# Patient Record
Sex: Female | Born: 1940 | Race: White | Hispanic: No | Marital: Married | Smoking: Never smoker
Health system: Southern US, Community
[De-identification: ages and names within clinical notes are randomized; demographics above are authoritative.]

## PROBLEM LIST (undated history)

## (undated) DIAGNOSIS — R519 Headache, unspecified: Secondary | ICD-10-CM

## (undated) DIAGNOSIS — E039 Hypothyroidism, unspecified: Secondary | ICD-10-CM

## (undated) DIAGNOSIS — E785 Hyperlipidemia, unspecified: Secondary | ICD-10-CM

## (undated) DIAGNOSIS — K649 Unspecified hemorrhoids: Secondary | ICD-10-CM

## (undated) DIAGNOSIS — I10 Essential (primary) hypertension: Secondary | ICD-10-CM

## (undated) DIAGNOSIS — M1990 Unspecified osteoarthritis, unspecified site: Secondary | ICD-10-CM

## (undated) DIAGNOSIS — K219 Gastro-esophageal reflux disease without esophagitis: Secondary | ICD-10-CM

## (undated) DIAGNOSIS — I2510 Atherosclerotic heart disease of native coronary artery without angina pectoris: Secondary | ICD-10-CM

## (undated) DIAGNOSIS — R569 Unspecified convulsions: Secondary | ICD-10-CM

## (undated) DIAGNOSIS — R51 Headache: Secondary | ICD-10-CM

## (undated) HISTORY — PX: GANGLION CYST EXCISION: SHX1691

## (undated) HISTORY — PX: JOINT REPLACEMENT: SHX530

## (undated) HISTORY — PX: BUNIONECTOMY: SHX129

## (undated) HISTORY — DX: Unspecified convulsions: R56.9

## (undated) HISTORY — PX: TRIGGER FINGER RELEASE: SHX641

## (undated) HISTORY — PX: EYE SURGERY: SHX253

## (undated) HISTORY — PX: CARDIAC CATHETERIZATION: SHX172

## (undated) HISTORY — DX: Atherosclerotic heart disease of native coronary artery without angina pectoris: I25.10

---

## 1988-05-31 HISTORY — PX: ABDOMINAL HYSTERECTOMY: SHX81

## 2012-02-11 ENCOUNTER — Other Ambulatory Visit: Payer: Self-pay | Admitting: Neurosurgery

## 2012-03-20 ENCOUNTER — Encounter (HOSPITAL_COMMUNITY): Payer: Self-pay | Admitting: Pharmacy Technician

## 2012-03-24 ENCOUNTER — Encounter (HOSPITAL_COMMUNITY)
Admission: RE | Admit: 2012-03-24 | Discharge: 2012-03-24 | Disposition: A | Discharge status: Home / Self Care | Payer: Medicare Other | Source: Ambulatory Visit | Attending: Neurosurgery | Admitting: Neurosurgery

## 2012-03-24 ENCOUNTER — Ambulatory Visit (HOSPITAL_COMMUNITY)
Admission: RE | Admit: 2012-03-24 | Discharge: 2012-03-24 | Disposition: A | Discharge status: Home / Self Care | Payer: Medicare Other | Source: Ambulatory Visit | Attending: Neurosurgery | Admitting: Neurosurgery

## 2012-03-24 ENCOUNTER — Encounter (HOSPITAL_COMMUNITY): Payer: Self-pay

## 2012-03-24 DIAGNOSIS — M5380 Other specified dorsopathies, site unspecified: Secondary | ICD-10-CM | POA: Insufficient documentation

## 2012-03-24 DIAGNOSIS — Z01818 Encounter for other preprocedural examination: Secondary | ICD-10-CM | POA: Insufficient documentation

## 2012-03-24 HISTORY — DX: Unspecified hemorrhoids: K64.9

## 2012-03-24 HISTORY — DX: Hyperlipidemia, unspecified: E78.5

## 2012-03-24 HISTORY — DX: Essential (primary) hypertension: I10

## 2012-03-24 HISTORY — DX: Gastro-esophageal reflux disease without esophagitis: K21.9

## 2012-03-24 HISTORY — DX: Hypothyroidism, unspecified: E03.9

## 2012-03-24 HISTORY — DX: Unspecified osteoarthritis, unspecified site: M19.90

## 2012-03-24 LAB — CBC
HCT: 40.7 % (ref 36.0–46.0)
Hemoglobin: 13.6 g/dL (ref 12.0–15.0)
MCH: 32.1 pg (ref 26.0–34.0)
MCHC: 33.4 g/dL (ref 30.0–36.0)
MCV: 96 fL (ref 78.0–100.0)
Platelets: 226 10*3/uL (ref 150–400)
RBC: 4.24 MIL/uL (ref 3.87–5.11)
RDW: 12.5 % (ref 11.5–15.5)
WBC: 6.8 10*3/uL (ref 4.0–10.5)

## 2012-03-24 LAB — BASIC METABOLIC PANEL
BUN: 20 mg/dL (ref 6–23)
CO2: 32 mEq/L (ref 19–32)
Calcium: 9.8 mg/dL (ref 8.4–10.5)
Chloride: 101 mEq/L (ref 96–112)
Creatinine, Ser: 0.95 mg/dL (ref 0.50–1.10)
GFR calc Af Amer: 68 mL/min — ABNORMAL LOW (ref >90)
GFR calc non Af Amer: 59 mL/min — ABNORMAL LOW (ref >90)
Glucose, Bld: 95 mg/dL (ref 70–99)
Potassium: 3.8 mEq/L (ref 3.5–5.1)
Sodium: 141 mEq/L (ref 135–145)

## 2012-03-24 LAB — SURGICAL PCR SCREEN
MRSA, PCR: NEGATIVE
Staphylococcus aureus: NEGATIVE

## 2012-03-24 NOTE — Pre-Procedure Instructions (Signed)
20 Zakara Beaird  03/24/2012   Your procedure is scheduled on:  Monday, November 4th.  Report to [facility] at 5:30 AM.  Call this number if you have problems the morning of surgery: [phone]   Remember:   Do not eat food or drink any liquid:After Midnight.     Take these medicines the morning of surgery with A SIP OF WATER: Levothyroxine (Synthyroid).  Stop taking any Aspirin or NSAIDs or Herbal Medications (Fish Oil, Coenzyme Q10).  Do not wear jewelry, make-up or nail polish.  Do not wear lotions, powders, or perfumes. You may wear deodorant.  Do not shave 48 hours prior to surgery. Men may shave face and neck.  Do not bring valuables to the hospital.  Contacts, dentures or bridgework may not be worn into surgery.  Leave suitcase in the car. After surgery it may be brought to your room.  For patients admitted to the hospital, checkout time is 11:00 AM the day of discharge.   Patients discharged the day of surgery will not be allowed to drive home.  Name and phone number of your driver: NA  Special Instructions: Shower using CHG 2 nights before surgery and the night before surgery.  If you shower the day of surgery use CHG.  Use special wash - you have one bottle of CHG for all showers.  You should use approximately 1/3 of the bottle for each shower.   Please read over the following fact sheets that you were given: Pain Booklet, Coughing and Deep Breathing and Surgical Site Infection Prevention

## 2012-03-27 ENCOUNTER — Other Ambulatory Visit (HOSPITAL_COMMUNITY): Payer: Self-pay

## 2012-04-02 MED ORDER — CEFAZOLIN SODIUM-DEXTROSE 2-3 GM-% IV SOLR
2.0000 g | INTRAVENOUS | Status: AC
  Administered 2012-04-03: 2 g via INTRAVENOUS
  Filled 2012-04-02: qty 50

## 2012-04-03 ENCOUNTER — Inpatient Hospital Stay (HOSPITAL_COMMUNITY): Payer: Medicare Other

## 2012-04-03 ENCOUNTER — Encounter (HOSPITAL_COMMUNITY): Payer: Self-pay | Admitting: *Deleted

## 2012-04-03 ENCOUNTER — Inpatient Hospital Stay (HOSPITAL_COMMUNITY)
Admission: RE | Admit: 2012-04-03 | Discharge: 2012-04-04 | DRG: 473 | Disposition: A | Discharge status: Home / Self Care | Payer: Medicare Other | Source: Ambulatory Visit | Attending: Neurosurgery | Admitting: Neurosurgery

## 2012-04-03 ENCOUNTER — Inpatient Hospital Stay (HOSPITAL_COMMUNITY): Payer: Medicare Other | Admitting: Anesthesiology

## 2012-04-03 ENCOUNTER — Encounter (HOSPITAL_COMMUNITY): Payer: Self-pay | Admitting: Anesthesiology

## 2012-04-03 ENCOUNTER — Encounter (HOSPITAL_COMMUNITY)
Admission: RE | Disposition: A | Discharge status: Home / Self Care | Payer: Self-pay | Source: Ambulatory Visit | Attending: Neurosurgery

## 2012-04-03 DIAGNOSIS — M5020 Other cervical disc displacement, unspecified cervical region: Secondary | ICD-10-CM | POA: Diagnosis present

## 2012-04-03 DIAGNOSIS — Z96698 Presence of other orthopedic joint implants: Secondary | ICD-10-CM

## 2012-04-03 DIAGNOSIS — M5030 Other cervical disc degeneration, unspecified cervical region: Principal | ICD-10-CM | POA: Diagnosis present

## 2012-04-03 DIAGNOSIS — M47812 Spondylosis without myelopathy or radiculopathy, cervical region: Secondary | ICD-10-CM | POA: Diagnosis present

## 2012-04-03 DIAGNOSIS — E785 Hyperlipidemia, unspecified: Secondary | ICD-10-CM | POA: Diagnosis present

## 2012-04-03 DIAGNOSIS — I10 Essential (primary) hypertension: Secondary | ICD-10-CM | POA: Diagnosis present

## 2012-04-03 DIAGNOSIS — Z79899 Other long term (current) drug therapy: Secondary | ICD-10-CM

## 2012-04-03 DIAGNOSIS — E039 Hypothyroidism, unspecified: Secondary | ICD-10-CM | POA: Diagnosis present

## 2012-04-03 DIAGNOSIS — K219 Gastro-esophageal reflux disease without esophagitis: Secondary | ICD-10-CM | POA: Diagnosis present

## 2012-04-03 HISTORY — PX: ANTERIOR CERVICAL DECOMP/DISCECTOMY FUSION: SHX1161

## 2012-04-03 SURGERY — ANTERIOR CERVICAL DECOMPRESSION/DISCECTOMY FUSION 1 LEVEL
Anesthesia: General | Wound class: Clean

## 2012-04-03 MED ORDER — ROCURONIUM BROMIDE 100 MG/10ML IV SOLN
INTRAVENOUS | Status: DC | PRN
  Administered 2012-04-03: 45 mg via INTRAVENOUS

## 2012-04-03 MED ORDER — ONDANSETRON HCL 4 MG/2ML IJ SOLN
4.0000 mg | Freq: Once | INTRAMUSCULAR | Status: AC | PRN
  Administered 2012-04-03: 4 mg via INTRAVENOUS

## 2012-04-03 MED ORDER — MENTHOL 3 MG MT LOZG: 1.0000 | LOZENGE | OROMUCOSAL | Status: DC | PRN

## 2012-04-03 MED ORDER — SODIUM CHLORIDE 0.9 % IV SOLN: 250.0000 mL | INTRAVENOUS | Status: DC

## 2012-04-03 MED ORDER — ALUM & MAG HYDROXIDE-SIMETH 200-200-20 MG/5ML PO SUSP: 30.0000 mL | Freq: Four times a day (QID) | ORAL | Status: DC | PRN

## 2012-04-03 MED ORDER — HYDROMORPHONE HCL PF 1 MG/ML IJ SOLN
INTRAMUSCULAR | Status: AC
  Filled 2012-04-03: qty 1

## 2012-04-03 MED ORDER — HYDROCHLOROTHIAZIDE 12.5 MG PO CAPS
12.5000 mg | ORAL_CAPSULE | Freq: Every day | ORAL | Status: DC
  Filled 2012-04-03: qty 1

## 2012-04-03 MED ORDER — BISACODYL 10 MG RE SUPP: 10.0000 mg | Freq: Every day | RECTAL | Status: DC | PRN

## 2012-04-03 MED ORDER — LIDOCAINE HCL (CARDIAC) 20 MG/ML IV SOLN
INTRAVENOUS | Status: DC | PRN
  Administered 2012-04-03: 40 mg via INTRAVENOUS

## 2012-04-03 MED ORDER — MAGNESIUM HYDROXIDE 400 MG/5ML PO SUSP: 30.0000 mL | Freq: Every day | ORAL | Status: DC | PRN

## 2012-04-03 MED ORDER — FENTANYL CITRATE 0.05 MG/ML IJ SOLN
INTRAMUSCULAR | Status: DC | PRN
  Administered 2012-04-03 (×2): 50 ug via INTRAVENOUS
  Administered 2012-04-03: 100 ug via INTRAVENOUS

## 2012-04-03 MED ORDER — ONDANSETRON HCL 4 MG/2ML IJ SOLN
INTRAMUSCULAR | Status: AC
  Filled 2012-04-03: qty 2

## 2012-04-03 MED ORDER — THROMBIN 5000 UNITS EX SOLR
CUTANEOUS | Status: DC | PRN
  Administered 2012-04-03 (×2): 5000 [IU] via TOPICAL

## 2012-04-03 MED ORDER — ACETAMINOPHEN 650 MG RE SUPP: 650.0000 mg | RECTAL | Status: DC | PRN

## 2012-04-03 MED ORDER — HYDROXYZINE HCL 50 MG PO TABS
50.0000 mg | ORAL_TABLET | ORAL | Status: DC | PRN
  Filled 2012-04-03: qty 1

## 2012-04-03 MED ORDER — GELATIN ABSORBABLE MT POWD
OROMUCOSAL | Status: DC | PRN
  Administered 2012-04-03: 09:00:00 via TOPICAL

## 2012-04-03 MED ORDER — PANTOPRAZOLE SODIUM 40 MG PO TBEC: 40.0000 mg | DELAYED_RELEASE_TABLET | Freq: Every day | ORAL | Status: DC

## 2012-04-03 MED ORDER — BUPIVACAINE HCL (PF) 0.5 % IJ SOLN
INTRAMUSCULAR | Status: DC | PRN
  Administered 2012-04-03: 5 mL

## 2012-04-03 MED ORDER — KCL IN DEXTROSE-NACL 20-5-0.45 MEQ/L-%-% IV SOLN
INTRAVENOUS | Status: DC
  Filled 2012-04-03 (×4): qty 1000

## 2012-04-03 MED ORDER — KETOROLAC TROMETHAMINE 30 MG/ML IJ SOLN
INTRAMUSCULAR | Status: AC
  Filled 2012-04-03: qty 1

## 2012-04-03 MED ORDER — HYDROCODONE-ACETAMINOPHEN 5-325 MG PO TABS: 1.0000 | ORAL_TABLET | ORAL | Status: DC | PRN

## 2012-04-03 MED ORDER — DIAZEPAM 5 MG/ML IJ SOLN
INTRAMUSCULAR | Status: AC
  Administered 2012-04-03: 2.5 mg
  Filled 2012-04-03: qty 2

## 2012-04-03 MED ORDER — SIMVASTATIN 20 MG PO TABS
20.0000 mg | ORAL_TABLET | Freq: Every evening | ORAL | Status: DC
  Administered 2012-04-03: 20 mg via ORAL
  Filled 2012-04-03 (×2): qty 1

## 2012-04-03 MED ORDER — ACETAMINOPHEN 10 MG/ML IV SOLN
1000.0000 mg | Freq: Once | INTRAVENOUS | Status: AC | PRN
  Administered 2012-04-03: 1000 mg via INTRAVENOUS

## 2012-04-03 MED ORDER — MIDAZOLAM HCL 5 MG/5ML IJ SOLN
INTRAMUSCULAR | Status: DC | PRN
  Administered 2012-04-03: 1 mg via INTRAVENOUS

## 2012-04-03 MED ORDER — MORPHINE SULFATE 4 MG/ML IJ SOLN: 4.0000 mg | INTRAMUSCULAR | Status: DC | PRN

## 2012-04-03 MED ORDER — LIDOCAINE-EPINEPHRINE 1 %-1:100000 IJ SOLN
INTRAMUSCULAR | Status: DC | PRN
  Administered 2012-04-03: 5 mL via INTRADERMAL

## 2012-04-03 MED ORDER — ONDANSETRON HCL 4 MG/2ML IJ SOLN
INTRAMUSCULAR | Status: DC | PRN
  Administered 2012-04-03: 4 mg via INTRAVENOUS

## 2012-04-03 MED ORDER — HEMOSTATIC AGENTS (NO CHARGE) OPTIME
TOPICAL | Status: DC | PRN
  Administered 2012-04-03: 1 via TOPICAL

## 2012-04-03 MED ORDER — ACETAMINOPHEN 10 MG/ML IV SOLN
1000.0000 mg | Freq: Four times a day (QID) | INTRAVENOUS | Status: DC
  Administered 2012-04-03 – 2012-04-04 (×3): 1000 mg via INTRAVENOUS
  Filled 2012-04-03 (×4): qty 100

## 2012-04-03 MED ORDER — NEOSTIGMINE METHYLSULFATE 1 MG/ML IJ SOLN
INTRAMUSCULAR | Status: DC | PRN
  Administered 2012-04-03: 3 mg via INTRAVENOUS

## 2012-04-03 MED ORDER — SODIUM CHLORIDE 0.9 % IJ SOLN
3.0000 mL | Freq: Two times a day (BID) | INTRAMUSCULAR | Status: DC
  Administered 2012-04-03: 3 mL via INTRAVENOUS

## 2012-04-03 MED ORDER — SODIUM CHLORIDE 0.9 % IR SOLN
Status: DC | PRN
  Administered 2012-04-03: 08:00:00

## 2012-04-03 MED ORDER — HYDROXYZINE HCL 50 MG/ML IM SOLN: 50.0000 mg | INTRAMUSCULAR | Status: DC | PRN

## 2012-04-03 MED ORDER — HYDROMORPHONE HCL PF 1 MG/ML IJ SOLN
0.2500 mg | INTRAMUSCULAR | Status: DC | PRN
  Administered 2012-04-03: 0.5 mg via INTRAVENOUS

## 2012-04-03 MED ORDER — ACETAMINOPHEN 325 MG PO TABS: 650.0000 mg | ORAL_TABLET | ORAL | Status: DC | PRN

## 2012-04-03 MED ORDER — PHENOL 1.4 % MT LIQD: 1.0000 | OROMUCOSAL | Status: DC | PRN

## 2012-04-03 MED ORDER — SODIUM CHLORIDE 0.9 % IJ SOLN: 3.0000 mL | INTRAMUSCULAR | Status: DC | PRN

## 2012-04-03 MED ORDER — GLYCOPYRROLATE 0.2 MG/ML IJ SOLN
INTRAMUSCULAR | Status: DC | PRN
  Administered 2012-04-03: 0.4 mg via INTRAVENOUS

## 2012-04-03 MED ORDER — SODIUM CHLORIDE 0.9 % IV SOLN
INTRAVENOUS | Status: AC
  Filled 2012-04-03: qty 500

## 2012-04-03 MED ORDER — PROPOFOL 10 MG/ML IV BOLUS
INTRAVENOUS | Status: DC | PRN
  Administered 2012-04-03: 170 mg via INTRAVENOUS

## 2012-04-03 MED ORDER — KETOROLAC TROMETHAMINE 30 MG/ML IJ SOLN
30.0000 mg | Freq: Four times a day (QID) | INTRAMUSCULAR | Status: DC
  Administered 2012-04-03 – 2012-04-04 (×3): 30 mg via INTRAVENOUS
  Filled 2012-04-03 (×7): qty 1

## 2012-04-03 MED ORDER — KETOROLAC TROMETHAMINE 30 MG/ML IJ SOLN
30.0000 mg | Freq: Once | INTRAMUSCULAR | Status: AC
  Administered 2012-04-03: 30 mg via INTRAVENOUS

## 2012-04-03 MED ORDER — ZOLPIDEM TARTRATE 5 MG PO TABS: 5.0000 mg | ORAL_TABLET | Freq: Every evening | ORAL | Status: DC | PRN

## 2012-04-03 MED ORDER — ACETAMINOPHEN 10 MG/ML IV SOLN
INTRAVENOUS | Status: AC
  Filled 2012-04-03: qty 100

## 2012-04-03 MED ORDER — CYCLOBENZAPRINE HCL 10 MG PO TABS
10.0000 mg | ORAL_TABLET | Freq: Three times a day (TID) | ORAL | Status: DC | PRN
  Administered 2012-04-03: 10 mg via ORAL
  Filled 2012-04-03: qty 1

## 2012-04-03 MED ORDER — ARTIFICIAL TEARS OP OINT
TOPICAL_OINTMENT | OPHTHALMIC | Status: DC | PRN
  Administered 2012-04-03: 1 via OPHTHALMIC

## 2012-04-03 MED ORDER — EPHEDRINE SULFATE 50 MG/ML IJ SOLN
INTRAMUSCULAR | Status: DC | PRN
  Administered 2012-04-03 (×6): 5 mg via INTRAVENOUS

## 2012-04-03 MED ORDER — LEVOTHYROXINE SODIUM 25 MCG PO TABS
25.0000 ug | ORAL_TABLET | Freq: Every day | ORAL | Status: DC
  Filled 2012-04-03: qty 1

## 2012-04-03 MED ORDER — OXYCODONE HCL 5 MG PO TABS: 5.0000 mg | ORAL_TABLET | ORAL | Status: DC | PRN

## 2012-04-03 MED ORDER — LOSARTAN POTASSIUM-HCTZ 50-12.5 MG PO TABS: 1.0000 | ORAL_TABLET | Freq: Every day | ORAL | Status: DC

## 2012-04-03 MED ORDER — 0.9 % SODIUM CHLORIDE (POUR BTL) OPTIME
TOPICAL | Status: DC | PRN
  Administered 2012-04-03: 1000 mL

## 2012-04-03 MED ORDER — LOSARTAN POTASSIUM 50 MG PO TABS
50.0000 mg | ORAL_TABLET | Freq: Every day | ORAL | Status: DC
  Filled 2012-04-03: qty 1

## 2012-04-03 MED ORDER — BACITRACIN 50000 UNITS IM SOLR
INTRAMUSCULAR | Status: AC
  Filled 2012-04-03: qty 1

## 2012-04-03 MED ORDER — OXYCODONE-ACETAMINOPHEN 5-325 MG PO TABS: 1.0000 | ORAL_TABLET | ORAL | Status: DC | PRN

## 2012-04-03 MED ORDER — LACTATED RINGERS IV SOLN
INTRAVENOUS | Status: DC | PRN
  Administered 2012-04-03 (×2): via INTRAVENOUS

## 2012-04-03 SURGICAL SUPPLY — 47 items
ALLOGRAFT 7X14X11 (Bone Implant) ×2 IMPLANT
BAG DECANTER FOR FLEXI CONT (MISCELLANEOUS) ×2 IMPLANT
BIT DRILL NEURO 2X3.1 SFT TUCH (MISCELLANEOUS) ×1 IMPLANT
BLADE ULTRA TIP 2M (BLADE) ×2 IMPLANT
BRUSH SCRUB EZ PLAIN DRY (MISCELLANEOUS) ×2 IMPLANT
CANISTER SUCTION 2500CC (MISCELLANEOUS) ×2 IMPLANT
CLOTH BEACON ORANGE TIMEOUT ST (SAFETY) ×2 IMPLANT
CONT SPEC 4OZ CLIKSEAL STRL BL (MISCELLANEOUS) ×2 IMPLANT
COVER MAYO STAND STRL (DRAPES) ×2 IMPLANT
DECANTER SPIKE VIAL GLASS SM (MISCELLANEOUS) ×2 IMPLANT
DERMABOND ADVANCED (GAUZE/BANDAGES/DRESSINGS) ×1
DERMABOND ADVANCED .7 DNX12 (GAUZE/BANDAGES/DRESSINGS) ×1 IMPLANT
DRAPE LAPAROTOMY 100X72 PEDS (DRAPES) ×2 IMPLANT
DRAPE MICROSCOPE LEICA (MISCELLANEOUS) ×2 IMPLANT
DRAPE POUCH INSTRU U-SHP 10X18 (DRAPES) ×2 IMPLANT
DRAPE PROXIMA HALF (DRAPES) IMPLANT
DRILL NEURO 2X3.1 SOFT TOUCH (MISCELLANEOUS) ×2
ELECT COATED BLADE 2.86 ST (ELECTRODE) ×4 IMPLANT
ELECT REM PT RETURN 9FT ADLT (ELECTROSURGICAL) ×2
ELECTRODE REM PT RTRN 9FT ADLT (ELECTROSURGICAL) ×1 IMPLANT
GLOVE BIOGEL PI IND STRL 8 (GLOVE) ×2 IMPLANT
GLOVE BIOGEL PI INDICATOR 8 (GLOVE) ×2
GLOVE ECLIPSE 7.5 STRL STRAW (GLOVE) ×8 IMPLANT
GLOVE EXAM NITRILE LRG STRL (GLOVE) IMPLANT
GLOVE EXAM NITRILE MD LF STRL (GLOVE) ×2 IMPLANT
GLOVE EXAM NITRILE XL STR (GLOVE) IMPLANT
GLOVE EXAM NITRILE XS STR PU (GLOVE) IMPLANT
GOWN BRE IMP SLV AUR LG STRL (GOWN DISPOSABLE) IMPLANT
GOWN BRE IMP SLV AUR XL STRL (GOWN DISPOSABLE) ×4 IMPLANT
GOWN STRL REIN 2XL LVL4 (GOWN DISPOSABLE) ×2 IMPLANT
HEAD HALTER (SOFTGOODS) ×2 IMPLANT
KIT BASIN OR (CUSTOM PROCEDURE TRAY) ×2 IMPLANT
KIT ROOM TURNOVER OR (KITS) ×2 IMPLANT
NEEDLE HYPO 25X1 1.5 SAFETY (NEEDLE) ×2 IMPLANT
NEEDLE SPNL 22GX3.5 QUINCKE BK (NEEDLE) ×2 IMPLANT
NS IRRIG 1000ML POUR BTL (IV SOLUTION) ×2 IMPLANT
PACK LAMINECTOMY NEURO (CUSTOM PROCEDURE TRAY) ×2 IMPLANT
PAD ARMBOARD 7.5X6 YLW CONV (MISCELLANEOUS) ×6 IMPLANT
RUBBERBAND STERILE (MISCELLANEOUS) ×4 IMPLANT
SPONGE INTESTINAL PEANUT (DISPOSABLE) ×2 IMPLANT
SPONGE SURGIFOAM ABS GEL SZ50 (HEMOSTASIS) ×2 IMPLANT
SUT VIC AB 2-0 CP2 18 (SUTURE) ×2 IMPLANT
SUT VIC AB 3-0 SH 8-18 (SUTURE) ×2 IMPLANT
SYR 20ML ECCENTRIC (SYRINGE) ×2 IMPLANT
TOWEL OR 17X24 6PK STRL BLUE (TOWEL DISPOSABLE) ×2 IMPLANT
TOWEL OR 17X26 10 PK STRL BLUE (TOWEL DISPOSABLE) ×2 IMPLANT
WATER STERILE IRR 1000ML POUR (IV SOLUTION) ×2 IMPLANT

## 2012-04-03 NOTE — Anesthesia Preprocedure Evaluation (Addendum)
Anesthesia Evaluation  Patient identified by MRN, date of birth, ID band Patient awake    Reviewed: Allergy & Precautions, H&P , NPO status , Patient's Chart, lab work & pertinent test results  Airway Mallampati: II TM Distance: >3 FB     Dental  (+) Teeth Intact and Dental Advisory Given   Pulmonary  breath sounds clear to auscultation        Cardiovascular hypertension, Pt. on medications Rhythm:Regular Rate:Normal     Neuro/Psych    GI/Hepatic GERD-  Medicated,  Endo/Other  Hypothyroidism   Renal/GU      Musculoskeletal   Abdominal   Peds  Hematology   Anesthesia Other Findings   Reproductive/Obstetrics                          Anesthesia Physical Anesthesia Plan  ASA: III  Anesthesia Plan: General   Post-op Pain Management:    Induction: Intravenous  Airway Management Planned: Oral ETT  Additional Equipment:   Intra-op Plan:   Post-operative Plan: Extubation in OR  Informed Consent: I have reviewed the patients History and Physical, chart, labs and discussed the procedure including the risks, benefits and alternatives for the proposed anesthesia with the patient or authorized representative who has indicated his/her understanding and acceptance.   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

## 2012-04-03 NOTE — Progress Notes (Signed)
Filed Vitals:   04/03/12 1015 04/03/12 1030 04/03/12 1100 04/03/12 1211  BP: 122/48 118/48 137/68 122/63  Pulse:   51 63  Temp:  97.6 F (36.4 C)    TempSrc:      Resp:   16 16  Height:      Weight:      SpO2:   98% 98%    Patient with moderate upper back discomfort and muscular spasm. Wound clean and dry. Patient is ambulated, and voided. Moving all 4 extremities well.  Plan: Encouraged to continue progress, and increase ambulation.  Hewitt Shorts, MD 04/03/2012, 4:07 PM

## 2012-04-03 NOTE — Transfer of Care (Signed)
Immediate Anesthesia Transfer of Care Note  Patient: Tiffany Mayo  Procedure(s) Performed: Procedure(s) (LRB) with comments: ANTERIOR CERVICAL DECOMPRESSION/DISCECTOMY FUSION 1 LEVEL (N/A) - Cervical Five-Six Anterior Cervical Decompression with Fusion Plating and Bonegraft  Patient Location: PACU  Anesthesia Type:General  Level of Consciousness: awake, alert  and oriented  Airway & Oxygen Therapy: Patient Spontanous Breathing and Patient connected to nasal cannula oxygen  Post-op Assessment: Report given to PACU RN  Post vital signs: Reviewed and stable  Complications: No apparent anesthesia complications

## 2012-04-03 NOTE — Progress Notes (Signed)
UR COMPLETED  

## 2012-04-03 NOTE — H&P (Signed)
Subjective: Patient is a 71 y.o. female who is admitted for treatment of level degenerative disc disease and spondylosis, with the most advanced degenerative changes seen at the C5-6 level. Patient has symptoms of headaches and neck pain extending into the shoulders and arms bilaterally. Symptoms have been worsening. She's been treated with medication including steroids and muscle relaxants, without lasting relief. She is admitted for single level CV-6 anterior cervical decompression arthrodesis.     Past Medical History  Diagnosis Date  . Hypertension   . Hyperlipemia   . Hypothyroidism   . GERD (gastroesophageal reflux disease)   . Hemorrhoid   . Arthritis     spine, neck    Past Surgical History  Procedure Date  . Eye surgery     Lasik, Catarct  . Joint replacement     Right middle finger  . Trigger finger release     thumbs  . Abdominal hysterectomy 1990    Prescriptions prior to admission  Medication Sig Dispense Refill  . Coenzyme Q10 (COQ-10 PO) Take 1 tablet by mouth daily.      Marland Kitchen levothyroxine (SYNTHROID, LEVOTHROID) 25 MCG tablet Take 25 mcg by mouth daily.      Marland Kitchen losartan-hydrochlorothiazide (HYZAAR) 50-12.5 MG per tablet Take 1 tablet by mouth daily.      . Omega-3 Fatty Acids (FISH OIL) 1000 MG CAPS Take 1,000 mg by mouth daily.      Marland Kitchen omeprazole (PRILOSEC) 20 MG capsule Take 20 mg by mouth daily.      . simvastatin (ZOCOR) 20 MG tablet Take 20 mg by mouth every evening.      . vitamin B-12 (CYANOCOBALAMIN) 1000 MCG tablet Take 1,000 mcg by mouth daily.       Allergies  Allergen Reactions  . Codeine Nausea And Vomiting  . Sulfa Antibiotics Nausea And Vomiting  . Latex Rash    History  Substance Use Topics  . Smoking status: Not on file  . Smokeless tobacco: Not on file  . Alcohol Use: No    History reviewed. No pertinent family history.   Review of Systems A comprehensive review of systems was negative.  Objective: Vital signs in last 24 hours: Temp:   [97.6 F (36.4 C)] 97.6 F (36.4 C) (11/04 1610) Pulse Rate:  [64] 64  (11/04 0608) Resp:  [18] 18  (11/04 0608) BP: (125)/(81) 125/81 mmHg (11/04 0608) SpO2:  [98 %] 98 % (11/04 0608) Weight:  [67.5 kg (148 lb 13 oz)] 67.5 kg (148 lb 13 oz) (11/04 0644)  EXAM: Patient is a well-developed well-nourished white female in no acute distress. Lungs are clear to auscultation , the patient has symmetrical respiratory excursion. Heart has a regular rate and rhythm normal S1 and S2 no murmur.   Abdomen is soft nontender nondistended bowel sounds are present. Extremity examination shows no clubbing cyanosis or edema. Musculoskeletal examination shows no tenderness to palpation of the cervical spinous processes or paracervical musculature. She has a good range of motion neck. But she has discomfort with forward flexion, as well as lateral flexion to the right, less discomfort with extension and lateral flexion to the left. Motor examination shows 5 over 5 strength in the upper extremities including the deltoid biceps triceps and intrinsics and grip. Sensation is intact to pinprick throughout the digits of the upper extremities. Reflexes are symmetrical and without evidence of pathologic reflexes. Patient has a normal gait and stance.   Data Review:CBC    Component Value Date/Time   WBC  6.8 03/24/2012 1604   RBC 4.24 03/24/2012 1604   HGB 13.6 03/24/2012 1604   HCT 40.7 03/24/2012 1604   PLT 226 03/24/2012 1604   MCV 96.0 03/24/2012 1604   MCH 32.1 03/24/2012 1604   MCHC 33.4 03/24/2012 1604   RDW 12.5 03/24/2012 1604                          BMET    Component Value Date/Time   NA 141 03/24/2012 1604   K 3.8 03/24/2012 1604   CL 101 03/24/2012 1604   CO2 32 03/24/2012 1604   GLUCOSE 95 03/24/2012 1604   BUN 20 03/24/2012 1604   CREATININE 0.95 03/24/2012 1604   CALCIUM 9.8 03/24/2012 1604   GFRNONAA 59* 03/24/2012 1604   GFRAA 68* 03/24/2012 1604     Assessment/Plan: Patient with  cervical degenerative disease and spondylosis, with the most advanced degenerative changes seen at the C5-6 level, who is admitted for a single level C5-6 ACDF.  I've discussed with the patient the nature of his condition, the nature the surgical procedure, the typical length of surgery, hospital stay, and overall recuperation. We discussed limitations postoperatively. I discussed risks of surgery including risks of infection, bleeding, possibly need for transfusion, the risk of nerve root dysfunction with pain, weakness, numbness, or paresthesias, the risk of spinal cord dysfunction with paralysis of all 4 limbs and quadriplegia, and the risk of dural tear and CSF leakage and possible need for further surgery, the risk of esophageal dysfunction causing dysphagia and the risk of laryngeal dysfunction causing hoarseness of the voice, the risk of failure of the arthrodesis and the possible need for further surgery, and the risk of anesthetic complications including myocardial infarction, stroke, pneumonia, and death. We also discussed the need for postoperative immobilization in a cervical collar. Understanding all this the patient does wish to proceed with surgery and is admitted for such.    Hewitt Shorts, MD 04/03/2012 7:06 AM

## 2012-04-03 NOTE — Op Note (Signed)
04/03/2012  9:29 AM  PATIENT:  Tiffany Mayo  71 y.o. 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  ANESTHESIA:   general  EBL:  Total I/O In: 1000 [I.V.:1000] Out: 100 [Blood:100]  BLOOD ADMINISTERED:none  COUNT: Correct per nursing staff  DICTATION: Patient was brought to the operating room placed under general endotracheal anesthesia. Patient was placed in 10 pounds of halter traction. The neck was prepped with Betadine soap and solution and draped in a sterile fashion. A horizontal incision was made on the left side of the neck. The line of the incision was infiltrated with local anesthetic with epinephrine. Dissection was carried down thru the subcutaneous tissue and platysma, bipolar cautery was used to maintain hemostasis. Dissection was then carried down thru an avascular plane leaving the sternocleidomastoid carotid artery and jugular vein laterally and the trachea and esophagus medially. The ventral aspect of the vertebral column was identified and a localizing x-ray was taken. The C5-6 level was identified. The annulus was incised and the disc space entered. Anterior osteophytic overgrowth was removed using the osteophyte removal tool and the high-speed drill. Discectomy was performed with micro-curettes and pituitary rongeurs. The operating microscope was draped and brought into the field provided additional magnification illumination and visualization. Discectomy was continued posteriorly thru the disc space and  then the cartilaginous endplate was removed using micro-curettes along with the high-speed drill. Posterior osteophytic overgrowth was removed using the high-speed drill along with a 2 mm thin footplated Kerrison punch. Posterior longitudinal ligament along with disc herniation was carefully removed, decompressing the spinal canal and thecal sac. We then continued to remove osteophytic overgrowth and disc material decompressing the neural foramina and exiting nerve roots bilaterally. Once the decompression was completed hemostasis was established with the use of Gelfoam with thrombin, Surgifoam, and bipolar cautery. The Gelfoam was removed the wound irrigated and hemostasis confirmed. We then measured the height of the intravertebral disc space and selected a 7 millimeter in height structural allograft. It was hydrated and saline solution and then gently positioned in the intravertebral disc space and countersunk. We then selected a 12 millimeter in height Tether cervical plate. It was positioned over the fusion construct and secured to the vertebra with 13 x 4 mm fixed screws at the C5 level, and 13 x 4 mm variable screws at the C6 level. Each screw hole was started with the high-speed drill and then the screws placed once all the screws were placed final tightening was performed. The wound was irrigated with bacitracin solution checked for hemostasis which was established and confirmed. An x-ray was taken which showed grafts in good position, plate and screws in good position, and the overall alignment to be good. We then proceeded with closure. The platysma was closed with interrupted inverted 2-0 undyed Vicryl suture, the subcutaneous and subcuticular closed with interrupted inverted 3-0 undyed Vicryl suture. The skin edges were approximated with Dermabond. Following surgery the patient was taken out of cervical traction. To be reversed and the anesthetic and taken to the recovery room for further  care.   PLAN OF CARE: Admit for overnight observation  PATIENT DISPOSITION:  PACU - hemodynamically  stable.   Delay start of Pharmacological VTE agent (>24hrs) due to surgical blood loss or risk of bleeding:  yes

## 2012-04-03 NOTE — Preoperative (Signed)
Beta Blockers   Reason not to administer Beta Blockers:Not Applicable 

## 2012-04-03 NOTE — Anesthesia Postprocedure Evaluation (Signed)
  Anesthesia Post-op Note  Patient: Tiffany Mayo  Procedure(s) Performed: Procedure(s) (LRB) with comments: ANTERIOR CERVICAL DECOMPRESSION/DISCECTOMY FUSION 1 LEVEL (N/A) - Cervical Five-Six Anterior Cervical Decompression with Fusion Plating and Bonegraft  Patient Location: PACU  Anesthesia Type:General  Level of Consciousness: awake, alert  and oriented  Airway and Oxygen Therapy: Patient Spontanous Breathing and Patient connected to nasal cannula oxygen  Post-op Pain: mild  Post-op Assessment: Post-op Vital signs reviewed and Patient's Cardiovascular Status Stable  Post-op Vital Signs: stable  Complications: No apparent anesthesia complications

## 2012-04-04 ENCOUNTER — Encounter (HOSPITAL_COMMUNITY): Payer: Self-pay | Admitting: Neurosurgery

## 2012-04-04 MED ORDER — HYDROCODONE-ACETAMINOPHEN 5-325 MG PO TABS: 1.0000 | ORAL_TABLET | ORAL | Status: DC | PRN

## 2012-04-04 NOTE — Progress Notes (Signed)
Pt doing well. Pt up ambulating without assistance. Pt given D/C instructions with Rx, verbal understanding given. Pt D/C'd home with husband via wheelchair per MD order. Rema Fendt, RN

## 2012-04-04 NOTE — Discharge Summary (Signed)
Physician Discharge Summary  Patient ID: Tiffany Mayo MRN: [id number] DOB/AGE: [birth year] 71 y.o.  Admit date: 04/03/2012 Discharge date: 04/04/2012  Admission Diagnoses:  Cervical herniated disc, cervical spondylosis, cervical degenerative disc disease, cervical stenosis  Discharge Diagnoses:  Cervical herniated disc, cervical spondylosis, cervical degenerative disc disease, cervical stenosis   Discharged Condition: good  Hospital Course:  Patient was admitted, underwent a single level C5-6 inches cervical decompression and arthrodesis. She is done well following surgery. She is up and ambulating. She is being discharged home with instructions regarding wound care and activities. She is to return for followup with me in about 3 weeks.  Discharge Exam: Blood pressure 114/55, pulse 64, temperature 97.9 F (36.6 C), temperature source Oral, resp. rate 16, height 5\' 3"  (1.6 m), weight 67.5 kg (148 lb 13 oz), SpO2 98.00%.  Disposition:  Home     Medication List     As of 04/04/2012  7:51 AM    TAKE these medications         COQ-10 PO   Take 1 tablet by mouth daily.      Fish Oil 1000 MG Caps   Take 1,000 mg by mouth daily.      HYDROcodone-acetaminophen 5-325 MG per tablet   Commonly known as: NORCO/VICODIN   Take 1-2 tablets by mouth every 4 (four) hours as needed for pain.      levothyroxine 25 MCG tablet   Commonly known as: SYNTHROID, LEVOTHROID   Take 25 mcg by mouth daily.      losartan-hydrochlorothiazide 50-12.5 MG per tablet   Commonly known as: HYZAAR   Take 1 tablet by mouth daily.      omeprazole 20 MG capsule   Commonly known as: PRILOSEC   Take 20 mg by mouth daily.      simvastatin 20 MG tablet   Commonly known as: ZOCOR   Take 20 mg by mouth every evening.      vitamin B-12 1000 MCG tablet   Commonly known as: CYANOCOBALAMIN   Take 1,000 mcg by mouth daily.         Signed: Hewitt Shorts, MD 04/04/2012, 7:51 AM

## 2012-04-05 ENCOUNTER — Emergency Department (HOSPITAL_COMMUNITY): Payer: Medicare Other

## 2012-04-05 ENCOUNTER — Encounter (HOSPITAL_COMMUNITY): Payer: Self-pay | Admitting: Emergency Medicine

## 2012-04-05 ENCOUNTER — Emergency Department (HOSPITAL_COMMUNITY)
Admission: EM | Admit: 2012-04-05 | Discharge: 2012-04-05 | Disposition: A | Discharge status: Home / Self Care | Payer: Medicare Other | Attending: Emergency Medicine | Admitting: Emergency Medicine

## 2012-04-05 DIAGNOSIS — I10 Essential (primary) hypertension: Secondary | ICD-10-CM | POA: Insufficient documentation

## 2012-04-05 DIAGNOSIS — IMO0002 Reserved for concepts with insufficient information to code with codable children: Secondary | ICD-10-CM | POA: Insufficient documentation

## 2012-04-05 DIAGNOSIS — E785 Hyperlipidemia, unspecified: Secondary | ICD-10-CM | POA: Insufficient documentation

## 2012-04-05 DIAGNOSIS — E039 Hypothyroidism, unspecified: Secondary | ICD-10-CM | POA: Insufficient documentation

## 2012-04-05 DIAGNOSIS — Z79899 Other long term (current) drug therapy: Secondary | ICD-10-CM | POA: Insufficient documentation

## 2012-04-05 DIAGNOSIS — Z8719 Personal history of other diseases of the digestive system: Secondary | ICD-10-CM | POA: Insufficient documentation

## 2012-04-05 DIAGNOSIS — R112 Nausea with vomiting, unspecified: Secondary | ICD-10-CM | POA: Insufficient documentation

## 2012-04-05 DIAGNOSIS — Y838 Other surgical procedures as the cause of abnormal reaction of the patient, or of later complication, without mention of misadventure at the time of the procedure: Secondary | ICD-10-CM | POA: Insufficient documentation

## 2012-04-05 DIAGNOSIS — M129 Arthropathy, unspecified: Secondary | ICD-10-CM | POA: Insufficient documentation

## 2012-04-05 DIAGNOSIS — K219 Gastro-esophageal reflux disease without esophagitis: Secondary | ICD-10-CM | POA: Insufficient documentation

## 2012-04-05 LAB — CBC WITH DIFFERENTIAL/PLATELET
Basophils Absolute: 0 10*3/uL (ref 0.0–0.1)
Basophils Relative: 0 % (ref 0–1)
Eosinophils Relative: 2 % (ref 0–5)
Lymphocytes Relative: 12 % (ref 12–46)
MCHC: 33.3 g/dL (ref 30.0–36.0)
MCV: 95.8 fL (ref 78.0–100.0)
Monocytes Absolute: 0.6 10*3/uL (ref 0.1–1.0)
Platelets: 192 10*3/uL (ref 150–400)
RDW: 12.7 % (ref 11.5–15.5)
WBC: 8.5 10*3/uL (ref 4.0–10.5)

## 2012-04-05 LAB — COMPREHENSIVE METABOLIC PANEL
CO2: 28 mEq/L (ref 19–32)
Calcium: 9.2 mg/dL (ref 8.4–10.5)
Creatinine, Ser: 0.85 mg/dL (ref 0.50–1.10)
GFR calc Af Amer: 78 mL/min — ABNORMAL LOW (ref >90)
GFR calc non Af Amer: 67 mL/min — ABNORMAL LOW (ref >90)
Glucose, Bld: 101 mg/dL — ABNORMAL HIGH (ref 70–99)
Total Bilirubin: 0.7 mg/dL (ref 0.3–1.2)

## 2012-04-05 LAB — URINE MICROSCOPIC-ADD ON

## 2012-04-05 LAB — URINALYSIS, ROUTINE W REFLEX MICROSCOPIC
Ketones, ur: NEGATIVE mg/dL
Leukocytes, UA: NEGATIVE
Protein, ur: NEGATIVE mg/dL
Urobilinogen, UA: 0.2 mg/dL (ref 0.0–1.0)

## 2012-04-05 MED ORDER — ONDANSETRON HCL 4 MG PO TABS: 4.0000 mg | ORAL_TABLET | Freq: Three times a day (TID) | ORAL | Status: DC | PRN

## 2012-04-05 MED ORDER — PANTOPRAZOLE SODIUM 40 MG IV SOLR
40.0000 mg | Freq: Once | INTRAVENOUS | Status: AC
  Administered 2012-04-05: 40 mg via INTRAVENOUS
  Filled 2012-04-05: qty 40

## 2012-04-05 MED ORDER — SODIUM CHLORIDE 0.9 % IV SOLN
1000.0000 mL | Freq: Once | INTRAVENOUS | Status: AC
  Administered 2012-04-05: 1000 mL via INTRAVENOUS

## 2012-04-05 MED ORDER — BISACODYL 10 MG RE SUPP
10.0000 mg | Freq: Once | RECTAL | Status: AC
  Administered 2012-04-05: 10 mg via RECTAL
  Filled 2012-04-05: qty 1

## 2012-04-05 MED ORDER — SENNOSIDES-DOCUSATE SODIUM 8.6-50 MG PO TABS: 1.0000 | ORAL_TABLET | Freq: Every day | ORAL | Status: DC

## 2012-04-05 MED ORDER — SODIUM CHLORIDE 0.9 % IV SOLN: 1000.0000 mL | INTRAVENOUS | Status: DC

## 2012-04-05 MED ORDER — ONDANSETRON HCL 4 MG/2ML IJ SOLN
4.0000 mg | Freq: Once | INTRAMUSCULAR | Status: AC
  Administered 2012-04-05: 4 mg via INTRAVENOUS
  Filled 2012-04-05: qty 2

## 2012-04-05 MED ORDER — ESOMEPRAZOLE MAGNESIUM 40 MG PO CPDR: 40.0000 mg | DELAYED_RELEASE_CAPSULE | Freq: Every day | ORAL | Status: DC

## 2012-04-05 NOTE — Consult Note (Signed)
Reason for Consult:  Nausea, dyspepsia, reflux, constipation  Tiffany Mayo is an 71 y.o. female.   HPI:  Patient is a 71 year old woman, 2 days status post a C5-6 anterior cervical decompression arthrodesis with allograft and cervical plating.  She was discharged yesterday morning, with a prescription for Norco 5/325. She's been using one tablet about every 4-5 hours for pain. She called the office earlier today explaining that she was having nausea and vomiting, and had had no bowel movement since prior to surgery. We had her come to the [facility] emergency room for evaluation where she was seen.   In speaking with the patient the nausea is mild, and she describes a burning in her esophagus, and distasteful material that's come up into the back of her throat, more consistent with reflux then with vomiting.   Past Medical History:  Past Medical History  Diagnosis Date  . Hypertension   . Hyperlipemia   . Hypothyroidism   . GERD (gastroesophageal reflux disease)   . Hemorrhoid   . Arthritis     spine, neck    Past Surgical History:  Past Surgical History  Procedure Date  . Eye surgery     Lasik, Catarct  . Joint replacement     Right middle finger  . Trigger finger release     thumbs  . Abdominal hysterectomy 1990  . Anterior cervical decomp/discectomy fusion 04/03/2012    Procedure: ANTERIOR CERVICAL DECOMPRESSION/DISCECTOMY FUSION 1 LEVEL;  Surgeon: Hewitt Shorts, MD;  Location: [facility];  Service: Neurosurgery;  Laterality: N/A;  Cervical Five-Six Anterior Cervical Decompression with Fusion Plating and Bonegraft    Family History: No family history on file.  Social History:  does not have a smoking history on file. She does not have any smokeless tobacco history on file. She reports that she does not drink alcohol or use illicit drugs.  Allergies:  Allergies  Allergen Reactions  . Codeine Nausea And Vomiting  . Sulfa Antibiotics Nausea And Vomiting  .  Latex Rash    Medications: I have reviewed the patient's current medications.  Physical examination: Patient is a well-developed well-nourished white female in no acute distress.  Blood pressure 138/69, pulse 84, temperature 98.3 F (36.8 C), resp. rate 16, SpO2 100.00%.  Wound: Clean dry, no underlying swelling. Lungs: Clear to auscultation, symmetrical respiratory excursion. Heart: Regular rate and rhythm, normal S1, S2, no murmur heard. Abdomen: Soft, nontender, nondistended, bowel sounds present in all 4 quadrants.   Assessment/Plan: Patient who presents 2 days following surgery with nausea and reflux (she does have a history of show reflux disease), as well as constipation. It does not seem that she has a significant ileus.  I discussed the situation with the patient and her husband, and also with Dr. Linwood Dibbles, at the emergency room physician covering the fast track area. We both feel that he can proceed with further evaluation as he feels indicated, including possibly laboratories and/or x-rays, and also proceed with symptomatic treatment as needed, which may include antacids and/or H2 antagonist and/or similar agents, and measures for her constipation. I suggested that using a non-narcotic analgesic, possibly tramadol,  might be helpful to lessen some of the symptoms that she's been experiencing. In the end of we will defer to Dr. Delford Field decisions regarding all these matters. He will contact me as needed. I've asked the patient to contact me at the office in 2 days, and let me know how she is progressing, and certainly to  recontact me sooner if needed.  Hewitt Shorts, MD 04/05/2012, 2:03 PM

## 2012-04-05 NOTE — ED Notes (Signed)
Pt c/o n/v that started today along with constipation. Pt reports she has been able to keep simple food and fluids down and vomited X 3 today. Pt reports she is able to pass gas, LBM 4 days ago.

## 2012-04-05 NOTE — ED Provider Notes (Signed)
Tiffany Mayo is a 71 y.o. female complaining of complaining of nausea and vomiting. Patient had neck surgery on Monday by Dr. Newell Coral. She's also had no bowel movement since that time. Patient is passing gas.  Patient seen and examined at the bedside. She is wearing a cervical soft collar. Patient states she is in minimal discomfort. And that nausea has resolved. Lung sounds are clear to auscultation bilaterally and heart is regular rate and rhythm with no murmurs rubs or gallops. Abdominal exam is benign with no tenderness to palpation or peritoneal signs.  Patient's blood work, urinalysis and acute abdominal series show no acute abnormalities. Patient passed by mouth challenge with ginger ale. She is stable for DC.   Pt verbalized understanding and agrees with care plan. Outpatient follow-up and return precautions given.     New Prescriptions   ESOMEPRAZOLE (NEXIUM) 40 MG CAPSULE    Take 1 capsule (40 mg total) by mouth daily.   ONDANSETRON (ZOFRAN) 4 MG TABLET    Take 1 tablet (4 mg total) by mouth every 8 (eight) hours as needed for nausea.   SENNA-DOCUSATE (SENOKOT S) 8.6-50 MG PER TABLET    Take 1 tablet by mouth daily.    Wynetta Emery, PA-C 04/05/12 2033

## 2012-04-05 NOTE — ED Provider Notes (Signed)
History   This chart was scribed for Celene Kras, MD by Albertha Ghee Rifaie. This patient was seen in room TR09C/TR09C and the patient's care was started at 2:10 PM.  CSN: [id number] Arrival date & time 04/05/12  1327     Chief complaint: Postoperative nausea and constipation  The history is provided by the patient. No language interpreter was used.   Tiffany Mayo is a 72 y.o. female who is 2 days post op from neck surgery presents to the Emergency Department complaining of bilious emesis onset 2 hours ago. There have been 4 episodes of vomiting. There is associated burning abdominal pain that travels up to the throat. Patient has a history of GERD and she is taking her medications as prescribed. She says the current burning sensation is more severe than past reflux. Patient had a neck surgery 2 days ago and ever since she has not had bowel movement. She was sent here by Dr. Luther Parody (surgeon) for check-up. Patient states she has been taking her prescribed medication. She denies fever, chest pain, chills, and dysuria.    Past Medical History  Diagnosis Date  . Hypertension   . Hyperlipemia   . Hypothyroidism   . GERD (gastroesophageal reflux disease)   . Hemorrhoid   . Arthritis     spine, neck    Past Surgical History  Procedure Date  . Eye surgery     Lasik, Catarct  . Joint replacement     Right middle finger  . Trigger finger release     thumbs  . Abdominal hysterectomy 1990  . Anterior cervical decomp/discectomy fusion 04/03/2012    Procedure: ANTERIOR CERVICAL DECOMPRESSION/DISCECTOMY FUSION 1 LEVEL;  Surgeon: Hewitt Shorts, MD;  Location: [facility];  Service: Neurosurgery;  Laterality: N/A;  Cervical Five-Six Anterior Cervical Decompression with Fusion Plating and Bonegraft    No family history on file.  History  Substance Use Topics  . Smoking status: Not on file  . Smokeless tobacco: Not on file  . Alcohol Use: No    No OB history provided   Review of  Systems  A complete 10 system review of systems was obtained and all systems are negative except as noted in the HPI and PMH.    Allergies  Codeine; Sulfa antibiotics; and Latex  Home Medications   Current Outpatient Rx  Name  Route  Sig  Dispense  Refill  . COQ-10 PO   Oral   Take 1 tablet by mouth daily.         Marland Kitchen HYDROCODONE-ACETAMINOPHEN 5-325 MG PO TABS   Oral   Take 1-2 tablets by mouth every 4 (four) hours as needed for pain.   60 tablet   0   . LEVOTHYROXINE SODIUM 25 MCG PO TABS   Oral   Take 25 mcg by mouth daily.         Marland Kitchen LOSARTAN POTASSIUM-HCTZ 50-12.5 MG PO TABS   Oral   Take 1 tablet by mouth daily.         Marland Kitchen FISH OIL 1000 MG PO CAPS   Oral   Take 1,000 mg by mouth daily.         Marland Kitchen OMEPRAZOLE 20 MG PO CPDR   Oral   Take 20 mg by mouth daily.         Marland Kitchen SIMVASTATIN 20 MG PO TABS   Oral   Take 20 mg by mouth every evening.         Marland Kitchen VITAMIN  B-12 1000 MCG PO TABS   Oral   Take 1,000 mcg by mouth daily.           BP 138/69  Pulse 84  Temp 98.3 F (36.8 C)  Resp 16  SpO2 100%  Physical Exam  Nursing note and vitals reviewed. Constitutional: She appears well-developed and well-nourished. No distress.  HENT:  Head: Normocephalic and atraumatic.  Right Ear: External ear normal.  Left Ear: External ear normal.  Eyes: Conjunctivae normal are normal. Right eye exhibits no discharge. Left eye exhibits no discharge. No scleral icterus.  Neck: Neck supple. No tracheal deviation present.       Soft cervical collar.  Cardiovascular: Normal rate, regular rhythm and intact distal pulses.   Pulmonary/Chest: Effort normal and breath sounds normal. No stridor. No respiratory distress. She has no wheezes. She has no rales.  Abdominal: Soft. She exhibits no distension. There is no tenderness. There is no rebound and no guarding.       Hypoactive bowel sounds.   Musculoskeletal: She exhibits no edema and no tenderness.  Neurological: She is  alert. She has normal strength. No sensory deficit. Cranial nerve deficit:  no gross defecits noted. She exhibits normal muscle tone. She displays no seizure activity. Coordination normal.  Skin: Skin is warm and dry. No rash noted.  Psychiatric: She has a normal mood and affect.    ED Course  Procedures (including critical care time)  DIAGNOSTIC STUDIES: Oxygen saturation is 100% on room air, nomral by my interpretation.    COORDINATION OF CARE: 2:17 PM Discussed treatment plan with pt at bedside and pt agreed to plan.   Labs Reviewed - No data to display No results found.     MDM  The patient will be moved to the main part of the emergency room. We'll check laboratory tests and plain films to rule out ileus or obstruction. It s very possible her symptoms may simply be related to acid reflux and constipation. Her nausea and vomiting could be related to the pain medications.  Dr. Newell Coral evaluated the patient in the emergency department.  He's available if the patient does not improve after treatment in the emergency department      I personally performed the services described in this documentation, which was scribed in my presence.  The recorded information has been reviewed and considered.    Celene Kras, MD 04/05/12 1425

## 2012-04-05 NOTE — ED Notes (Signed)
Had neck surgery on Monday and  Started to vomiting today and no bm since surgery dr Luther Parody here to see pt

## 2012-04-06 NOTE — ED Provider Notes (Signed)
Medical screening examination/treatment/procedure(s) were conducted as a shared visit with non-physician practitioner(s) and myself.  I personally evaluated the patient during the encounter   Celene Kras, MD 04/06/12 1505

## 2016-03-05 ENCOUNTER — Emergency Department (HOSPITAL_COMMUNITY): Payer: Medicare Other

## 2016-03-05 ENCOUNTER — Emergency Department (HOSPITAL_COMMUNITY)
Admission: EM | Admit: 2016-03-05 | Discharge: 2016-03-06 | Disposition: A | Discharge status: Home / Self Care | Payer: Medicare Other | Attending: Emergency Medicine | Admitting: Emergency Medicine

## 2016-03-05 ENCOUNTER — Encounter (HOSPITAL_COMMUNITY): Payer: Self-pay | Admitting: Emergency Medicine

## 2016-03-05 DIAGNOSIS — R791 Abnormal coagulation profile: Secondary | ICD-10-CM | POA: Insufficient documentation

## 2016-03-05 DIAGNOSIS — R200 Anesthesia of skin: Secondary | ICD-10-CM | POA: Diagnosis present

## 2016-03-05 DIAGNOSIS — E039 Hypothyroidism, unspecified: Secondary | ICD-10-CM | POA: Insufficient documentation

## 2016-03-05 DIAGNOSIS — Z91040 Latex allergy status: Secondary | ICD-10-CM | POA: Insufficient documentation

## 2016-03-05 DIAGNOSIS — Z79899 Other long term (current) drug therapy: Secondary | ICD-10-CM | POA: Diagnosis not present

## 2016-03-05 DIAGNOSIS — I10 Essential (primary) hypertension: Secondary | ICD-10-CM | POA: Insufficient documentation

## 2016-03-05 LAB — CBC
HCT: 40.9 % (ref 36.0–46.0)
Hemoglobin: 13.3 g/dL (ref 12.0–15.0)
MCH: 31.9 pg (ref 26.0–34.0)
MCHC: 32.5 g/dL (ref 30.0–36.0)
MCV: 98.1 fL (ref 78.0–100.0)
PLATELETS: 254 10*3/uL (ref 150–400)
RBC: 4.17 MIL/uL (ref 3.87–5.11)
RDW: 12.6 % (ref 11.5–15.5)
WBC: 6.4 10*3/uL (ref 4.0–10.5)

## 2016-03-05 LAB — COMPREHENSIVE METABOLIC PANEL
ALBUMIN: 3.9 g/dL (ref 3.5–5.0)
ALK PHOS: 53 U/L (ref 38–126)
ALT: 15 U/L (ref 14–54)
AST: 25 U/L (ref 15–41)
Anion gap: 9 (ref 5–15)
BILIRUBIN TOTAL: 0.8 mg/dL (ref 0.3–1.2)
BUN: 21 mg/dL — AB (ref 6–20)
CO2: 27 mmol/L (ref 22–32)
Calcium: 9.5 mg/dL (ref 8.9–10.3)
Chloride: 101 mmol/L (ref 101–111)
Creatinine, Ser: 0.95 mg/dL (ref 0.44–1.00)
GFR calc Af Amer: >60 mL/min (ref >60)
GFR calc non Af Amer: 57 mL/min — ABNORMAL LOW (ref >60)
GLUCOSE: 123 mg/dL — AB (ref 65–99)
POTASSIUM: 3.5 mmol/L (ref 3.5–5.1)
SODIUM: 137 mmol/L (ref 135–145)
TOTAL PROTEIN: 6.9 g/dL (ref 6.5–8.1)

## 2016-03-05 LAB — URINE MICROSCOPIC-ADD ON: RBC / HPF: NONE SEEN RBC/hpf (ref 0–5)

## 2016-03-05 LAB — DIFFERENTIAL
BASOS ABS: 0 10*3/uL (ref 0.0–0.1)
Basophils Relative: 0 %
EOS ABS: 0.2 10*3/uL (ref 0.0–0.7)
Eosinophils Relative: 3 %
LYMPHS ABS: 2 10*3/uL (ref 0.7–4.0)
LYMPHS PCT: 32 %
Monocytes Absolute: 0.5 10*3/uL (ref 0.1–1.0)
Monocytes Relative: 7 %
NEUTROS PCT: 58 %
Neutro Abs: 3.7 10*3/uL (ref 1.7–7.7)

## 2016-03-05 LAB — I-STAT CHEM 8, ED
BUN: 25 mg/dL — AB (ref 6–20)
CALCIUM ION: 1.19 mmol/L (ref 1.15–1.40)
CHLORIDE: 100 mmol/L — AB (ref 101–111)
Creatinine, Ser: 1 mg/dL (ref 0.44–1.00)
Glucose, Bld: 122 mg/dL — ABNORMAL HIGH (ref 65–99)
HEMATOCRIT: 40 % (ref 36.0–46.0)
Hemoglobin: 13.6 g/dL (ref 12.0–15.0)
POTASSIUM: 3.5 mmol/L (ref 3.5–5.1)
Sodium: 139 mmol/L (ref 135–145)
TCO2: 28 mmol/L (ref 0–100)

## 2016-03-05 LAB — PROTIME-INR
INR: 0.96
Prothrombin Time: 12.8 seconds (ref 11.4–15.2)

## 2016-03-05 LAB — URINALYSIS, ROUTINE W REFLEX MICROSCOPIC
Bilirubin Urine: NEGATIVE
Glucose, UA: NEGATIVE mg/dL
Hgb urine dipstick: NEGATIVE
Ketones, ur: NEGATIVE mg/dL
NITRITE: NEGATIVE
PH: 5 (ref 5.0–8.0)
Protein, ur: NEGATIVE mg/dL
SPECIFIC GRAVITY, URINE: 1.015 (ref 1.005–1.030)

## 2016-03-05 LAB — APTT: APTT: 33 s (ref 24–36)

## 2016-03-05 LAB — I-STAT TROPONIN, ED: Troponin i, poc: 0 ng/mL (ref 0.00–0.08)

## 2016-03-05 LAB — CBG MONITORING, ED: GLUCOSE-CAPILLARY: 94 mg/dL (ref 65–99)

## 2016-03-05 MED ORDER — DIAZEPAM 5 MG PO TABS
5.0000 mg | ORAL_TABLET | Freq: Once | ORAL | Status: AC
  Administered 2016-03-05: 5 mg via ORAL
  Filled 2016-03-05: qty 1

## 2016-03-05 NOTE — ED Notes (Signed)
Pt at CT brought husband to room

## 2016-03-05 NOTE — ED Triage Notes (Signed)
MD Plunkett informed of patient and advised this RN to order Head CT.

## 2016-03-05 NOTE — ED Triage Notes (Signed)
Patient arrives with complaint of right hand numbness and weakness with onset today around 1500. States she had a cryo procedure done on that same hand about 1 week ago. Lesion noted to posterior hand. Today patient noticed that the hand became cold and numb. The sensation then progressed into her forearm and she felt it was weak. No neuro deficit appreciated by this RN in triage. Additionally patient endorses fullness in her head, but states she has been suffering with sinus congestion. Also states her balance hasn't felt right recently, but unable to state onset time. History of high cholesterol, and states she DC'd that medication herself recently.

## 2016-03-06 NOTE — ED Notes (Signed)
Pt stable, ambulatory, states understanding of discharge instructions 

## 2016-03-06 NOTE — ED Notes (Signed)
Pt sitting in chair at bedside fully dressed, BP cuff and pulse ox off.

## 2016-03-06 NOTE — ED Provider Notes (Signed)
[facility] Provider Note   CSN: [id number] Arrival date & time: 03/05/16  1954     History   Chief Complaint Chief Complaint  Patient presents with  . Numbness    HPI Tiffany Mayo is a 75 y.o. female.  HPI Patient presents to the emergency department with complaints of acute numbness and abnormal sensation in her right hand and extending up her right forearm.  She recently had a dermatologic procedure which included freezing a portion of her skin several days ago.  She called her dermatologist when she developed the symptoms today at around 3:15 who recommended that she seek medical care.  She denies weakness of her arms or legs.  Her husband did notice some slight speech abnormalities this afternoon such as switching words around.  This was transient.  She reports that the numbness in the right hand seems to be improving.  She is right handed.  She was able to write with her right hand today.  She does not drop any objects.  She denies weakness.  She does report mild headache at this time.   Past Medical History:  Diagnosis Date  . Arthritis    spine, neck  . GERD (gastroesophageal reflux disease)   . Hemorrhoid   . Hyperlipemia   . Hypertension   . Hypothyroidism     There are no active problems to display for this patient.   Past Surgical History:  Procedure Laterality Date  . ABDOMINAL HYSTERECTOMY  1990  . ANTERIOR CERVICAL DECOMP/DISCECTOMY FUSION  04/03/2012   Procedure: ANTERIOR CERVICAL DECOMPRESSION/DISCECTOMY FUSION 1 LEVEL;  Surgeon: Hewitt Shorts, MD;  Location: [facility];  Service: Neurosurgery;  Laterality: N/A;  Cervical Five-Six Anterior Cervical Decompression with Fusion Plating and Bonegraft  . EYE SURGERY     Lasik, Catarct  . JOINT REPLACEMENT     Right middle finger  . TRIGGER FINGER RELEASE     thumbs    OB History    No data available       Home Medications    Prior to Admission medications   Medication Sig Start Date  End Date Taking? Authorizing Provider  BIOTIN PO Take 1 tablet by mouth 2 (two) times daily.   Yes Historical Provider, MD  Cholecalciferol (VITAMIN D3) 1000 units CAPS Take 1 capsule by mouth daily.   Yes Historical Provider, MD  Coenzyme Q10 (COQ-10 PO) Take 1 capsule by mouth daily.    Yes Historical Provider, MD  esomeprazole (NEXIUM) 40 MG capsule Take 1 capsule (40 mg total) by mouth daily. 04/05/12  Yes Nicole Pisciotta, PA-C  ibuprofen (ADVIL,MOTRIN) 200 MG tablet Take 200 mg by mouth every 6 (six) hours as needed for headache.   Yes Historical Provider, MD  levothyroxine (SYNTHROID, LEVOTHROID) 25 MCG tablet Take 25 mcg by mouth daily.   Yes Historical Provider, MD  loratadine (CLARITIN) 10 MG tablet Take 10 mg by mouth daily as needed for allergies.   Yes Historical Provider, MD  losartan-hydrochlorothiazide (HYZAAR) 50-12.5 MG per tablet Take 1 tablet by mouth daily.   Yes Historical Provider, MD  minocycline (MINOCIN,DYNACIN) 100 MG capsule Take 100 mg by mouth 2 (two) times daily. For 10-14 days 03/02/16  Yes Historical Provider, MD  Omega-3 Fatty Acids (FISH OIL) 1000 MG CAPS Take 1,000 mg by mouth daily.   Yes Historical Provider, MD  HYDROcodone-acetaminophen (NORCO/VICODIN) 5-325 MG per tablet Take 1-2 tablets by mouth every 4 (four) hours as needed for pain. Patient not taking: Reported on  03/05/2016 04/04/12   Shirlean Kellyobert Nudelman, MD  ondansetron (ZOFRAN) 4 MG tablet Take 1 tablet (4 mg total) by mouth every 8 (eight) hours as needed for nausea. Patient not taking: Reported on 03/05/2016 04/05/12   Joni ReiningNicole Pisciotta, PA-C  senna-docusate (SENOKOT S) 8.6-50 MG per tablet Take 1 tablet by mouth daily. Patient not taking: Reported on 03/05/2016 04/05/12   Wynetta EmeryNicole Pisciotta, PA-C    Family History History reviewed. No pertinent family history.  Social History Social History  Substance Use Topics  . Smoking status: Never Smoker  . Smokeless tobacco: Never Used  . Alcohol use No      Allergies   Codeine; Sulfa antibiotics; and Latex   Review of Systems Review of Systems  All other systems reviewed and are negative.    Physical Exam Updated Vital Signs BP 106/87   Pulse 66   Temp 97.5 F (36.4 C) (Oral)   Resp 14   Ht 5' 3.25" (1.607 m)   Wt 142 lb (64.4 kg)   SpO2 100%   BMI 24.96 kg/m   Physical Exam  Constitutional: She is oriented to person, place, and time. She appears well-developed and well-nourished. No distress.  HENT:  Head: Normocephalic and atraumatic.  Eyes: EOM are normal. Pupils are equal, round, and reactive to light.  Neck: Normal range of motion.  Cardiovascular: Normal rate, regular rhythm and normal heart sounds.   Pulmonary/Chest: Effort normal and breath sounds normal.  Abdominal: Soft. She exhibits no distension. There is no tenderness.  Musculoskeletal:  Normal compartments of right upper extremity.  Normal right radial pulse.  Normal grip strength right hand.  No erythema or warmth of the right hand.  No obvious skin changes except the small area with a dermatologic procedure was performed overlying the right second metacarpal of the dorsal surface.  There is no signs of infection surrounding this  Neurological: She is alert and oriented to person, place, and time.  5/5 strength in major muscle groups of  bilateral upper and lower extremities. Speech normal. No facial asymetry.   Skin: Skin is warm and dry.  Psychiatric: She has a normal mood and affect. Judgment normal.  Nursing note and vitals reviewed.    ED Treatments / Results  Labs (all labs ordered are listed, but only abnormal results are displayed) Labs Reviewed  COMPREHENSIVE METABOLIC PANEL - Abnormal; Notable for the following:       Result Value   Glucose, Bld 123 (*)    BUN 21 (*)    GFR calc non Af Amer 57 (*)    All other components within normal limits  URINALYSIS, ROUTINE W REFLEX MICROSCOPIC (NOT AT [facility]) - Abnormal; Notable for the following:     Leukocytes, UA SMALL (*)    All other components within normal limits  URINE MICROSCOPIC-ADD ON - Abnormal; Notable for the following:    Squamous Epithelial / LPF 0-5 (*)    Bacteria, UA RARE (*)    All other components within normal limits  I-STAT CHEM 8, ED - Abnormal; Notable for the following:    Chloride 100 (*)    BUN 25 (*)    Glucose, Bld 122 (*)    All other components within normal limits  PROTIME-INR  APTT  CBC  DIFFERENTIAL  I-STAT TROPOININ, ED  CBG MONITORING, ED    EKG  EKG Interpretation None       Radiology Ct Head Wo Contrast  Result Date: 03/05/2016 CLINICAL DATA:  Numbness and weakness of  the right hand. Onset today. EXAM: CT HEAD WITHOUT CONTRAST TECHNIQUE: Contiguous axial images were obtained from the base of the skull through the vertex without intravenous contrast. COMPARISON:  None. FINDINGS: Brain: No mass lesion, intraparenchymal hemorrhage or extra-axial collection. No evidence of acute cortical infarct. Brain parenchyma and CSF-containing spaces are normal for age. Vascular: No hyperdense vessel or unexpected calcification. Skull: Normal visualized skull base, calvarium and extracranial soft tissues. Sinuses/Orbits: No sinus fluid levels or advanced mucosal thickening. No mastoid effusion. Normal orbits. IMPRESSION: Normal head CT for age. Electronically Signed   By: Deatra Robinson M.D.   On: 03/05/2016 21:57   Mr Brain Wo Contrast  Result Date: 03/05/2016 CLINICAL DATA:  Right arm and hand numbness EXAM: MRI HEAD WITHOUT CONTRAST TECHNIQUE: Multiplanar, multiecho pulse sequences of the brain and surrounding structures were obtained without intravenous contrast. COMPARISON:  Head CT 03/05/2016 FINDINGS: Brain: No acute infarct or intraparenchymal hemorrhage. The midline structures are normal. There is multifocal hyperintense T2-weighted signal within the deep white matter, most often seen in the setting of chronic microvascular ischemia. No mass  lesion or midline shift. No hydrocephalus or extra-axial fluid collection. Vascular: Major intracranial arterial and venous sinus flow voids are preserved. No evidence of chronic microhemorrhage or amyloid angiopathy. Skull and upper cervical spine: The visualized skull base, calvarium, upper cervical spine and extracranial soft tissues are normal. Sinuses/Orbits: No fluid levels or advanced mucosal thickening. No mastoid effusion. Normal orbits. IMPRESSION: 1. No acute intracranial abnormality. 2. 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 Electronically Signed   By: Deatra Robinson M.D.   On: 03/05/2016 23:53    Procedures Procedures (including critical care time)  Medications Ordered in ED Medications  diazepam (VALIUM) tablet 5 mg (5 mg Oral Given 03/05/16 2241)     Initial Impression / Assessment and Plan / ED Course  I have reviewed the triage vital signs and the nursing notes.  Pertinent labs & imaging results that were available during my care of the patient were reviewed by me and considered in my medical decision making (see chart for details).  Clinical Course    Patient is overall well-appearing.  Her symptoms seem to be improving without involvement by myself.  MRI without acute abnormality.  I do not think this a TIA.  At the distal peripheral nerve issue.  I will refer to outpatient neurology.  Patient can be discharged home safely from the emergency department.   Final Clinical Impressions(s) / ED Diagnoses   Final diagnoses:  Numbness of right hand    New Prescriptions New Prescriptions   No medications on file     Azalia Bilis, MD 03/06/16 [phone]

## 2016-03-23 ENCOUNTER — Telehealth (HOSPITAL_BASED_OUTPATIENT_CLINIC_OR_DEPARTMENT_OTHER): Payer: Self-pay | Admitting: Emergency Medicine

## 2016-03-23 NOTE — Telephone Encounter (Signed)
Lost to followup 

## 2016-05-27 ENCOUNTER — Other Ambulatory Visit: Payer: Self-pay | Admitting: Neurosurgery

## 2016-05-27 DIAGNOSIS — M5416 Radiculopathy, lumbar region: Secondary | ICD-10-CM

## 2016-06-08 ENCOUNTER — Ambulatory Visit
Admission: RE | Admit: 2016-06-08 | Discharge: 2016-06-08 | Disposition: A | Discharge status: Home / Self Care | Payer: Medicare Other | Source: Ambulatory Visit | Attending: Neurosurgery | Admitting: Neurosurgery

## 2016-06-08 DIAGNOSIS — M5416 Radiculopathy, lumbar region: Secondary | ICD-10-CM

## 2016-06-11 ENCOUNTER — Other Ambulatory Visit: Payer: Self-pay | Admitting: Neurosurgery

## 2016-06-29 ENCOUNTER — Inpatient Hospital Stay (HOSPITAL_COMMUNITY): Admission: RE | Admit: 2016-06-29 | Payer: Medicare Other | Source: Ambulatory Visit

## 2016-07-28 ENCOUNTER — Encounter (HOSPITAL_COMMUNITY)
Admission: RE | Admit: 2016-07-28 | Discharge: 2016-07-28 | Disposition: A | Discharge status: Home / Self Care | Payer: Medicare Other | Source: Ambulatory Visit | Attending: Neurosurgery | Admitting: Neurosurgery

## 2016-07-28 ENCOUNTER — Encounter (HOSPITAL_COMMUNITY): Payer: Self-pay | Admitting: *Deleted

## 2016-07-28 DIAGNOSIS — I10 Essential (primary) hypertension: Secondary | ICD-10-CM | POA: Diagnosis not present

## 2016-07-28 DIAGNOSIS — Z01812 Encounter for preprocedural laboratory examination: Secondary | ICD-10-CM | POA: Insufficient documentation

## 2016-07-28 DIAGNOSIS — Z01810 Encounter for preprocedural cardiovascular examination: Secondary | ICD-10-CM | POA: Insufficient documentation

## 2016-07-28 LAB — CBC
HCT: 37.5 % (ref 36.0–46.0)
Hemoglobin: 12.3 g/dL (ref 12.0–15.0)
MCH: 31.7 pg (ref 26.0–34.0)
MCHC: 32.8 g/dL (ref 30.0–36.0)
MCV: 96.6 fL (ref 78.0–100.0)
Platelets: 277 10*3/uL (ref 150–400)
RBC: 3.88 MIL/uL (ref 3.87–5.11)
RDW: 13.5 % (ref 11.5–15.5)
WBC: 6.9 10*3/uL (ref 4.0–10.5)

## 2016-07-28 LAB — TYPE AND SCREEN
ABO/RH(D): A POS
Antibody Screen: NEGATIVE

## 2016-07-28 LAB — BASIC METABOLIC PANEL
Anion gap: 7 (ref 5–15)
BUN: 16 mg/dL (ref 6–20)
CO2: 30 mmol/L (ref 22–32)
Calcium: 9.5 mg/dL (ref 8.9–10.3)
Chloride: 103 mmol/L (ref 101–111)
Creatinine, Ser: 0.91 mg/dL (ref 0.44–1.00)
GFR calc Af Amer: >60 mL/min (ref >60)
GFR calc non Af Amer: 60 mL/min — ABNORMAL LOW (ref >60)
Glucose, Bld: 85 mg/dL (ref 65–99)
Potassium: 4.1 mmol/L (ref 3.5–5.1)
Sodium: 140 mmol/L (ref 135–145)

## 2016-07-28 LAB — SURGICAL PCR SCREEN
MRSA, PCR: NEGATIVE
Staphylococcus aureus: NEGATIVE

## 2016-07-28 LAB — ABO/RH: ABO/RH(D): A POS

## 2016-07-28 NOTE — Progress Notes (Signed)
Clarified with Tiffany Mayo at Dr. Earl GalaNudelman's office that last part of consent should be lumbar three- lumbar five posterior lateral fusion.

## 2016-07-28 NOTE — Pre-Procedure Instructions (Signed)
Tiffany Mayo  07/28/2016      [facility] [place], [place] [place] [address] Phone: [phone] Fax: [phone]  [id number] [place] [facility] [address] [place] [address] Phone: [phone] Fax: [phone]  [facility] - [place], [place] - [address] [place] [address] Phone: [phone] Fax: [phone]    Your procedure is scheduled on   Wednesday  08/04/16  [facility] Admitting at 630 A.M.  Call this number if you have problems the morning of surgery:  [phone]   Remember:  Do not eat food or drink liquids after midnight.  Take these medicines the morning of surgery with A SIP OF WATER   LEVOTHYROXINE, OMEPRAZOLE    (STOP 7 DAYS PRIOR TO SURGERY- ASPIRIN OR ASPIRIN PRODUCTS, IBUPROFEN/ ADVIL/ MOTRIN, GOODY POWDERS, BC'S, HERBAL MEDICINES, MULTIVITAMIN)   Do not wear jewelry, make-up or nail polish.  Do not wear lotions, powders, or perfumes, or deoderant.  Do not shave 48 hours prior to surgery.  Men may shave face and neck.  Do not bring valuables to the hospital.  [facility] is not responsible for any belongings or valuables.  Contacts, dentures or bridgework may not be worn into surgery.  Leave your suitcase in the car.  After surgery it may be brought to your room.  For patients admitted to the hospital, discharge time will be determined by your treatment team.  Patients discharged the day of surgery will not be allowed to drive home.   Name and phone number of your driver:    Special instructions:  [facility] - Preparing for Surgery  Before surgery, you can play an important role.  Because skin is not sterile, your skin needs to be as free of germs as possible.  You can reduce the number of germs on you skin by washing with CHG (chlorahexidine gluconate) soap before surgery.  CHG is an antiseptic  cleaner which kills germs and bonds with the skin to continue killing germs even after washing.  Please DO NOT use if you have an allergy to CHG or antibacterial soaps.  If your skin becomes reddened/irritated stop using the CHG and inform your nurse when you arrive at Short Stay.  Do not shave (including legs and underarms) for at least 48 hours prior to the first CHG shower.  You may shave your face.  Please follow these instructions carefully:   1.  Shower with CHG Soap the night before surgery and the                                morning of Surgery.  2.  If you choose to wash your hair, wash your hair first as usual with your       normal shampoo.  3.  After you shampoo, rinse your hair and body thoroughly to remove the                      Shampoo.  4.  Use CHG as you would any other liquid soap.  You can apply chg directly       to the skin and wash gently with scrungie or a clean washcloth.  5.  Apply the CHG Soap to your body ONLY FROM THE NECK DOWN.  Do not use on open wounds or open sores.  Avoid contact with your eyes,       ears, mouth and genitals (private parts).  Wash genitals (private parts)       with your normal soap.  6.  Wash thoroughly, paying special attention to the area where your surgery        will be performed.  7.  Thoroughly rinse your body with warm water from the neck down.  8.  DO NOT shower/wash with your normal soap after using and rinsing off       the CHG Soap.  9.  Pat yourself dry with a clean towel.            10.  Wear clean pajamas.            11.  Place clean sheets on your bed the night of your first shower and do not        sleep with pets.  Day of Surgery  Do not apply any lotions/deoderants the morning of surgery.  Please wear clean clothes to the hospital/surgery center.    Please read over the following fact sheets that you were given. MRSA Information and Surgical Site Infection Prevention

## 2016-08-04 ENCOUNTER — Encounter (HOSPITAL_COMMUNITY)
Admission: RE | Disposition: A | Discharge status: Home / Self Care | Payer: Self-pay | Source: Ambulatory Visit | Attending: Neurosurgery

## 2016-08-04 ENCOUNTER — Encounter (HOSPITAL_COMMUNITY): Payer: Self-pay

## 2016-08-04 ENCOUNTER — Inpatient Hospital Stay (HOSPITAL_COMMUNITY): Payer: Medicare Other

## 2016-08-04 ENCOUNTER — Inpatient Hospital Stay (HOSPITAL_COMMUNITY): Payer: Medicare Other | Admitting: Anesthesiology

## 2016-08-04 ENCOUNTER — Inpatient Hospital Stay (HOSPITAL_COMMUNITY)
Admission: RE | Admit: 2016-08-04 | Discharge: 2016-08-06 | DRG: 455 | Disposition: A | Discharge status: Home / Self Care | Payer: Medicare Other | Source: Ambulatory Visit | Attending: Neurosurgery | Admitting: Neurosurgery

## 2016-08-04 DIAGNOSIS — M4690 Unspecified inflammatory spondylopathy, site unspecified: Secondary | ICD-10-CM | POA: Diagnosis present

## 2016-08-04 DIAGNOSIS — E039 Hypothyroidism, unspecified: Secondary | ICD-10-CM | POA: Diagnosis present

## 2016-08-04 DIAGNOSIS — K219 Gastro-esophageal reflux disease without esophagitis: Secondary | ICD-10-CM | POA: Diagnosis present

## 2016-08-04 DIAGNOSIS — M4186 Other forms of scoliosis, lumbar region: Secondary | ICD-10-CM | POA: Diagnosis present

## 2016-08-04 DIAGNOSIS — Z885 Allergy status to narcotic agent status: Secondary | ICD-10-CM | POA: Diagnosis not present

## 2016-08-04 DIAGNOSIS — I10 Essential (primary) hypertension: Secondary | ICD-10-CM | POA: Diagnosis present

## 2016-08-04 DIAGNOSIS — M4316 Spondylolisthesis, lumbar region: Secondary | ICD-10-CM | POA: Diagnosis present

## 2016-08-04 DIAGNOSIS — Z981 Arthrodesis status: Secondary | ICD-10-CM

## 2016-08-04 DIAGNOSIS — Z91040 Latex allergy status: Secondary | ICD-10-CM

## 2016-08-04 DIAGNOSIS — M48062 Spinal stenosis, lumbar region with neurogenic claudication: Principal | ICD-10-CM | POA: Diagnosis present

## 2016-08-04 DIAGNOSIS — Z79899 Other long term (current) drug therapy: Secondary | ICD-10-CM | POA: Diagnosis not present

## 2016-08-04 DIAGNOSIS — Z96691 Finger-joint replacement of right hand: Secondary | ICD-10-CM | POA: Diagnosis present

## 2016-08-04 DIAGNOSIS — Z882 Allergy status to sulfonamides status: Secondary | ICD-10-CM | POA: Diagnosis not present

## 2016-08-04 DIAGNOSIS — M48061 Spinal stenosis, lumbar region without neurogenic claudication: Secondary | ICD-10-CM | POA: Diagnosis present

## 2016-08-04 DIAGNOSIS — Z419 Encounter for procedure for purposes other than remedying health state, unspecified: Secondary | ICD-10-CM

## 2016-08-04 HISTORY — DX: Headache: R51

## 2016-08-04 HISTORY — DX: Headache, unspecified: R51.9

## 2016-08-04 LAB — CBC WITH DIFFERENTIAL/PLATELET
Basophils Absolute: 0 10*3/uL (ref 0.0–0.1)
Basophils Relative: 0 %
Eosinophils Absolute: 0 10*3/uL (ref 0.0–0.7)
Eosinophils Relative: 0 %
HCT: 28.9 % — ABNORMAL LOW (ref 36.0–46.0)
Hemoglobin: 9.4 g/dL — ABNORMAL LOW (ref 12.0–15.0)
Lymphocytes Relative: 5 %
Lymphs Abs: 0.8 10*3/uL (ref 0.7–4.0)
MCH: 31.4 pg (ref 26.0–34.0)
MCHC: 32.5 g/dL (ref 30.0–36.0)
MCV: 96.7 fL (ref 78.0–100.0)
Monocytes Absolute: 0.2 10*3/uL (ref 0.1–1.0)
Monocytes Relative: 1 %
Neutro Abs: 13.8 10*3/uL — ABNORMAL HIGH (ref 1.7–7.7)
Neutrophils Relative %: 94 %
Platelets: 198 10*3/uL (ref 150–400)
RBC: 2.99 MIL/uL — ABNORMAL LOW (ref 3.87–5.11)
RDW: 13.6 % (ref 11.5–15.5)
WBC: 14.8 10*3/uL — ABNORMAL HIGH (ref 4.0–10.5)

## 2016-08-04 LAB — BASIC METABOLIC PANEL
Anion gap: 7 (ref 5–15)
BUN: 18 mg/dL (ref 6–20)
CO2: 24 mmol/L (ref 22–32)
Calcium: 8.3 mg/dL — ABNORMAL LOW (ref 8.9–10.3)
Chloride: 105 mmol/L (ref 101–111)
Creatinine, Ser: 1 mg/dL (ref 0.44–1.00)
GFR calc Af Amer: >60 mL/min (ref >60)
GFR calc non Af Amer: 53 mL/min — ABNORMAL LOW (ref >60)
Glucose, Bld: 169 mg/dL — ABNORMAL HIGH (ref 65–99)
Potassium: 4.5 mmol/L (ref 3.5–5.1)
Sodium: 136 mmol/L (ref 135–145)

## 2016-08-04 SURGERY — POSTERIOR LUMBAR FUSION 2 LEVEL
Anesthesia: General | Site: Back

## 2016-08-04 MED ORDER — LIDOCAINE-EPINEPHRINE 2 %-1:100000 IJ SOLN
INTRAMUSCULAR | Status: DC | PRN
  Administered 2016-08-04: 15 mL via INTRADERMAL

## 2016-08-04 MED ORDER — PROMETHAZINE HCL 25 MG/ML IJ SOLN: 6.2500 mg | INTRAMUSCULAR | Status: DC | PRN

## 2016-08-04 MED ORDER — THROMBIN 5000 UNITS EX SOLR
CUTANEOUS | Status: AC
  Filled 2016-08-04: qty 5000

## 2016-08-04 MED ORDER — PHENYLEPHRINE HCL 10 MG/ML IJ SOLN
INTRAMUSCULAR | Status: DC | PRN
  Administered 2016-08-04 (×2): 80 ug via INTRAVENOUS

## 2016-08-04 MED ORDER — LIDOCAINE-EPINEPHRINE 2 %-1:100000 IJ SOLN
INTRAMUSCULAR | Status: AC
  Filled 2016-08-04: qty 1

## 2016-08-04 MED ORDER — THROMBIN 5000 UNITS EX SOLR
OROMUCOSAL | Status: DC | PRN
  Administered 2016-08-04: 12:00:00 via TOPICAL

## 2016-08-04 MED ORDER — BISACODYL 10 MG RE SUPP: 10.0000 mg | Freq: Every day | RECTAL | Status: DC | PRN

## 2016-08-04 MED ORDER — CYCLOBENZAPRINE HCL 5 MG PO TABS: 5.0000 mg | ORAL_TABLET | Freq: Three times a day (TID) | ORAL | Status: DC | PRN

## 2016-08-04 MED ORDER — ACETAMINOPHEN 650 MG RE SUPP: 650.0000 mg | RECTAL | Status: DC | PRN

## 2016-08-04 MED ORDER — THROMBIN 20000 UNITS EX SOLR
CUTANEOUS | Status: AC
  Filled 2016-08-04: qty 20000

## 2016-08-04 MED ORDER — ALUM & MAG HYDROXIDE-SIMETH 200-200-20 MG/5ML PO SUSP
30.0000 mL | Freq: Four times a day (QID) | ORAL | Status: DC | PRN
  Administered 2016-08-06: 30 mL via ORAL
  Filled 2016-08-04: qty 30

## 2016-08-04 MED ORDER — ARTIFICIAL TEARS OP OINT
TOPICAL_OINTMENT | OPHTHALMIC | Status: DC | PRN
  Administered 2016-08-04: 1 via OPHTHALMIC

## 2016-08-04 MED ORDER — ROCURONIUM BROMIDE 100 MG/10ML IV SOLN
INTRAVENOUS | Status: DC | PRN
  Administered 2016-08-04: 30 mg via INTRAVENOUS
  Administered 2016-08-04: 20 mg via INTRAVENOUS
  Administered 2016-08-04 (×2): 10 mg via INTRAVENOUS

## 2016-08-04 MED ORDER — HYDROCODONE-ACETAMINOPHEN 5-325 MG PO TABS
1.0000 | ORAL_TABLET | ORAL | Status: DC | PRN
  Administered 2016-08-04 – 2016-08-06 (×5): 1 via ORAL
  Filled 2016-08-04 (×5): qty 1

## 2016-08-04 MED ORDER — PROPOFOL 10 MG/ML IV BOLUS
INTRAVENOUS | Status: DC | PRN
  Administered 2016-08-04: 150 mg via INTRAVENOUS

## 2016-08-04 MED ORDER — ONDANSETRON HCL 4 MG PO TABS
4.0000 mg | ORAL_TABLET | Freq: Four times a day (QID) | ORAL | Status: DC | PRN
  Administered 2016-08-06: 4 mg via ORAL
  Filled 2016-08-04: qty 1

## 2016-08-04 MED ORDER — LOSARTAN POTASSIUM-HCTZ 50-12.5 MG PO TABS: 1.0000 | ORAL_TABLET | Freq: Every day | ORAL | Status: DC

## 2016-08-04 MED ORDER — ACETAMINOPHEN 325 MG PO TABS: 650.0000 mg | ORAL_TABLET | ORAL | Status: DC | PRN

## 2016-08-04 MED ORDER — CEFAZOLIN SODIUM-DEXTROSE 2-4 GM/100ML-% IV SOLN
2.0000 g | INTRAVENOUS | Status: AC
  Administered 2016-08-04: 1 g via INTRAVENOUS
  Administered 2016-08-04: 2 g via INTRAVENOUS
  Filled 2016-08-04: qty 100

## 2016-08-04 MED ORDER — MORPHINE SULFATE (PF) 4 MG/ML IV SOLN: 4.0000 mg | INTRAVENOUS | Status: DC | PRN

## 2016-08-04 MED ORDER — DEXAMETHASONE SODIUM PHOSPHATE 10 MG/ML IJ SOLN
INTRAMUSCULAR | Status: DC | PRN
  Administered 2016-08-04: 10 mg via INTRAVENOUS

## 2016-08-04 MED ORDER — MEPERIDINE HCL 25 MG/ML IJ SOLN: 6.2500 mg | INTRAMUSCULAR | Status: DC | PRN

## 2016-08-04 MED ORDER — PHENYLEPHRINE HCL 10 MG/ML IJ SOLN
0.0000 ug/min | INTRAMUSCULAR | Status: DC
  Administered 2016-08-04: 25 ug/min via INTRAVENOUS

## 2016-08-04 MED ORDER — PHENYLEPHRINE HCL 10 MG/ML IJ SOLN
INTRAVENOUS | Status: DC | PRN
  Administered 2016-08-04: 50 ug/min via INTRAVENOUS

## 2016-08-04 MED ORDER — HYDROCHLOROTHIAZIDE 12.5 MG PO CAPS
12.5000 mg | ORAL_CAPSULE | Freq: Every day | ORAL | Status: DC
  Filled 2016-08-04: qty 1

## 2016-08-04 MED ORDER — LEVOTHYROXINE SODIUM 25 MCG PO TABS
25.0000 ug | ORAL_TABLET | Freq: Every day | ORAL | Status: DC
  Administered 2016-08-05 – 2016-08-06 (×2): 25 ug via ORAL
  Filled 2016-08-04 (×3): qty 1

## 2016-08-04 MED ORDER — MAGNESIUM HYDROXIDE 400 MG/5ML PO SUSP: 30.0000 mL | Freq: Every day | ORAL | Status: DC | PRN

## 2016-08-04 MED ORDER — HYDROMORPHONE HCL 1 MG/ML IJ SOLN
0.2500 mg | INTRAMUSCULAR | Status: DC | PRN
  Administered 2016-08-04: 0.5 mg via INTRAVENOUS
  Administered 2016-08-04 (×2): 0.25 mg via INTRAVENOUS

## 2016-08-04 MED ORDER — VITAMIN D 1000 UNITS PO TABS
1000.0000 [IU] | ORAL_TABLET | Freq: Every day | ORAL | Status: DC
  Administered 2016-08-05 – 2016-08-06 (×2): 1000 [IU] via ORAL
  Filled 2016-08-04 (×2): qty 1

## 2016-08-04 MED ORDER — SODIUM CHLORIDE 0.9% FLUSH
3.0000 mL | Freq: Two times a day (BID) | INTRAVENOUS | Status: DC
  Administered 2016-08-05 (×2): 3 mL via INTRAVENOUS

## 2016-08-04 MED ORDER — HYDROXYZINE HCL 50 MG/ML IM SOLN: 50.0000 mg | INTRAMUSCULAR | Status: DC | PRN

## 2016-08-04 MED ORDER — FENTANYL CITRATE (PF) 100 MCG/2ML IJ SOLN
INTRAMUSCULAR | Status: DC | PRN
  Administered 2016-08-04 (×2): 25 ug via INTRAVENOUS
  Administered 2016-08-04: 150 ug via INTRAVENOUS
  Administered 2016-08-04 (×4): 25 ug via INTRAVENOUS

## 2016-08-04 MED ORDER — SODIUM CHLORIDE 0.9 % IV SOLN: 250.0000 mL | INTRAVENOUS | Status: DC

## 2016-08-04 MED ORDER — LACTATED RINGERS IV SOLN
INTRAVENOUS | Status: DC | PRN
  Administered 2016-08-04 (×3): via INTRAVENOUS

## 2016-08-04 MED ORDER — BUPIVACAINE HCL (PF) 0.5 % IJ SOLN
INTRAMUSCULAR | Status: DC | PRN
  Administered 2016-08-04: 15 mL

## 2016-08-04 MED ORDER — 0.9 % SODIUM CHLORIDE (POUR BTL) OPTIME
TOPICAL | Status: DC | PRN
  Administered 2016-08-04 (×3): 1000 mL

## 2016-08-04 MED ORDER — MENTHOL 3 MG MT LOZG: 1.0000 | LOZENGE | OROMUCOSAL | Status: DC | PRN

## 2016-08-04 MED ORDER — PHENOL 1.4 % MT LIQD: 1.0000 | OROMUCOSAL | Status: DC | PRN

## 2016-08-04 MED ORDER — KETOROLAC TROMETHAMINE 30 MG/ML IJ SOLN: 30.0000 mg | Freq: Once | INTRAMUSCULAR | Status: DC

## 2016-08-04 MED ORDER — KETOROLAC TROMETHAMINE 15 MG/ML IJ SOLN
INTRAMUSCULAR | Status: AC
  Filled 2016-08-04: qty 1

## 2016-08-04 MED ORDER — SUGAMMADEX SODIUM 200 MG/2ML IV SOLN
INTRAVENOUS | Status: DC | PRN
  Administered 2016-08-04: 127.6 mg via INTRAVENOUS

## 2016-08-04 MED ORDER — FLEET ENEMA 7-19 GM/118ML RE ENEM: 1.0000 | ENEMA | Freq: Once | RECTAL | Status: DC | PRN

## 2016-08-04 MED ORDER — KETOROLAC TROMETHAMINE 15 MG/ML IJ SOLN
15.0000 mg | Freq: Once | INTRAMUSCULAR | Status: AC
  Administered 2016-08-04: 15 mg via INTRAVENOUS

## 2016-08-04 MED ORDER — SODIUM CHLORIDE 0.9 % IR SOLN
Status: DC | PRN
  Administered 2016-08-04: 10:00:00

## 2016-08-04 MED ORDER — EPHEDRINE SULFATE 50 MG/ML IJ SOLN
INTRAMUSCULAR | Status: DC | PRN
  Administered 2016-08-04 (×2): 10 mg via INTRAVENOUS

## 2016-08-04 MED ORDER — ACETAMINOPHEN 10 MG/ML IV SOLN
INTRAVENOUS | Status: DC | PRN
  Administered 2016-08-04: 1000 mg via INTRAVENOUS

## 2016-08-04 MED ORDER — CHLORHEXIDINE GLUCONATE CLOTH 2 % EX PADS: 6.0000 | MEDICATED_PAD | Freq: Once | CUTANEOUS | Status: DC

## 2016-08-04 MED ORDER — THROMBIN 20000 UNITS EX SOLR
CUTANEOUS | Status: DC | PRN
  Administered 2016-08-04 (×2): via TOPICAL

## 2016-08-04 MED ORDER — KCL IN DEXTROSE-NACL 20-5-0.45 MEQ/L-%-% IV SOLN
INTRAVENOUS | Status: DC
  Administered 2016-08-04: 21:00:00 via INTRAVENOUS
  Filled 2016-08-04: qty 1000

## 2016-08-04 MED ORDER — ALBUMIN HUMAN 5 % IV SOLN
INTRAVENOUS | Status: DC | PRN
  Administered 2016-08-04: 12:00:00 via INTRAVENOUS

## 2016-08-04 MED ORDER — LIDOCAINE HCL (CARDIAC) 20 MG/ML IV SOLN
INTRAVENOUS | Status: DC | PRN
  Administered 2016-08-04: 100 mg via INTRAVENOUS

## 2016-08-04 MED ORDER — HYDROXYZINE HCL 50 MG PO TABS
50.0000 mg | ORAL_TABLET | ORAL | Status: DC | PRN
  Filled 2016-08-04: qty 1

## 2016-08-04 MED ORDER — ONDANSETRON HCL 4 MG/2ML IJ SOLN
INTRAMUSCULAR | Status: DC | PRN
  Administered 2016-08-04: 4 mg via INTRAVENOUS

## 2016-08-04 MED ORDER — LOSARTAN POTASSIUM 50 MG PO TABS
50.0000 mg | ORAL_TABLET | Freq: Every day | ORAL | Status: DC
  Filled 2016-08-04: qty 1

## 2016-08-04 MED ORDER — SODIUM CHLORIDE 0.9% FLUSH: 3.0000 mL | INTRAVENOUS | Status: DC | PRN

## 2016-08-04 MED ORDER — KETOROLAC TROMETHAMINE 15 MG/ML IJ SOLN
15.0000 mg | Freq: Four times a day (QID) | INTRAMUSCULAR | Status: DC
  Administered 2016-08-04 – 2016-08-06 (×6): 15 mg via INTRAVENOUS
  Filled 2016-08-04 (×6): qty 1

## 2016-08-04 MED ORDER — ONDANSETRON HCL 4 MG/2ML IJ SOLN: 4.0000 mg | Freq: Four times a day (QID) | INTRAMUSCULAR | Status: DC | PRN

## 2016-08-04 SURGICAL SUPPLY — 74 items
BAG DECANTER FOR FLEXI CONT (MISCELLANEOUS) ×2 IMPLANT
BENZOIN TINCTURE PRP APPL 2/3 (GAUZE/BANDAGES/DRESSINGS) IMPLANT
BLADE CLIPPER SURG (BLADE) IMPLANT
BUR ACRON 5.0MM COATED (BURR) ×4 IMPLANT
BUR MATCHSTICK NEURO 3.0 LAGG (BURR) ×2 IMPLANT
CANISTER SUCT 3000ML PPV (MISCELLANEOUS) IMPLANT
CAP LCK SPNE (Orthopedic Implant) ×6 IMPLANT
CAP LOCK SPINE RADIUS (Orthopedic Implant) ×6 IMPLANT
CAP LOCKING (Orthopedic Implant) ×6 IMPLANT
CARTRIDGE OIL MAESTRO DRILL (MISCELLANEOUS) ×1 IMPLANT
CONT SPEC 4OZ CLIKSEAL STRL BL (MISCELLANEOUS) ×2 IMPLANT
COVER BACK TABLE 60X90IN (DRAPES) ×2 IMPLANT
CROSSLINK MEDIUM (Orthopedic Implant) ×2 IMPLANT
DERMABOND ADVANCED (GAUZE/BANDAGES/DRESSINGS) ×1
DERMABOND ADVANCED .7 DNX12 (GAUZE/BANDAGES/DRESSINGS) ×1 IMPLANT
DIFFUSER DRILL AIR PNEUMATIC (MISCELLANEOUS) ×2 IMPLANT
DRAPE C-ARM 42X72 X-RAY (DRAPES) ×4 IMPLANT
DRAPE HALF SHEET 40X57 (DRAPES) IMPLANT
DRAPE LAPAROTOMY 100X72X124 (DRAPES) ×2 IMPLANT
DRAPE POUCH INSTRU U-SHP 10X18 (DRAPES) ×2 IMPLANT
ELECT REM PT RETURN 9FT ADLT (ELECTROSURGICAL) ×2
ELECTRODE REM PT RTRN 9FT ADLT (ELECTROSURGICAL) ×1 IMPLANT
GAUZE SPONGE 4X4 12PLY STRL (GAUZE/BANDAGES/DRESSINGS) ×2 IMPLANT
GAUZE SPONGE 4X4 16PLY XRAY LF (GAUZE/BANDAGES/DRESSINGS) ×2 IMPLANT
GLOVE BIOGEL PI IND STRL 7.5 (GLOVE) ×1 IMPLANT
GLOVE BIOGEL PI IND STRL 8 (GLOVE) ×3 IMPLANT
GLOVE BIOGEL PI INDICATOR 7.5 (GLOVE) ×1
GLOVE BIOGEL PI INDICATOR 8 (GLOVE) ×3
GLOVE ECLIPSE 7.5 STRL STRAW (GLOVE) ×4 IMPLANT
GLOVE SURG SS PI 7.0 STRL IVOR (GLOVE) ×2 IMPLANT
GLOVE SURG SS PI 8.0 STRL IVOR (GLOVE) ×2 IMPLANT
GOWN STRL REUS W/ TWL LRG LVL3 (GOWN DISPOSABLE) IMPLANT
GOWN STRL REUS W/ TWL XL LVL3 (GOWN DISPOSABLE) ×2 IMPLANT
GOWN STRL REUS W/TWL 2XL LVL3 (GOWN DISPOSABLE) IMPLANT
GOWN STRL REUS W/TWL LRG LVL3 (GOWN DISPOSABLE)
GOWN STRL REUS W/TWL XL LVL3 (GOWN DISPOSABLE) ×2
HEMOSTAT POWDER KIT SURGIFOAM (HEMOSTASIS) ×2 IMPLANT
KIT BASIN OR (CUSTOM PROCEDURE TRAY) ×2 IMPLANT
KIT INFUSE MEDIUM (Orthopedic Implant) ×2 IMPLANT
KIT ROOM TURNOVER OR (KITS) ×2 IMPLANT
MILL MEDIUM DISP (BLADE) ×2 IMPLANT
NEEDLE 18GX1X1/2 (RX/OR ONLY) (NEEDLE) ×4 IMPLANT
NEEDLE SPNL 18GX3.5 QUINCKE PK (NEEDLE) ×2 IMPLANT
NEEDLE SPNL 22GX3.5 QUINCKE BK (NEEDLE) ×2 IMPLANT
NS IRRIG 1000ML POUR BTL (IV SOLUTION) ×6 IMPLANT
OIL CARTRIDGE MAESTRO DRILL (MISCELLANEOUS) ×2
PACK LAMINECTOMY NEURO (CUSTOM PROCEDURE TRAY) ×2 IMPLANT
PAD ARMBOARD 7.5X6 YLW CONV (MISCELLANEOUS) ×6 IMPLANT
PATTIES SURGICAL .5 X.5 (GAUZE/BANDAGES/DRESSINGS) ×2 IMPLANT
PATTIES SURGICAL .5 X1 (DISPOSABLE) IMPLANT
PATTIES SURGICAL 1X1 (DISPOSABLE) ×4 IMPLANT
PEEK PLIF AVS 10X25X4 (Peek) ×8 IMPLANT
ROD MAX 50MM (Rod) ×4 IMPLANT
SCREW 5.75X45MM (Screw) ×12 IMPLANT
SPONGE LAP 4X18 X RAY DECT (DISPOSABLE) ×2 IMPLANT
SPONGE NEURO XRAY DETECT 1X3 (DISPOSABLE) ×2 IMPLANT
SPONGE SURGIFOAM ABS GEL 100 (HEMOSTASIS) ×4 IMPLANT
STAPLER SKIN PROX WIDE 3.9 (STAPLE) IMPLANT
STRIP BIOACTIVE VITOSS 25X100X (Neuro Prosthesis/Implant) ×4 IMPLANT
STRIP CLOSURE SKIN 1/2X4 (GAUZE/BANDAGES/DRESSINGS) IMPLANT
SUT PROLENE 6 0 BV (SUTURE) IMPLANT
SUT VIC AB 1 CT1 18XBRD ANBCTR (SUTURE) ×3 IMPLANT
SUT VIC AB 1 CT1 8-18 (SUTURE) ×3
SUT VIC AB 2-0 CP2 18 (SUTURE) ×4 IMPLANT
SYR 3ML LL SCALE MARK (SYRINGE) ×6 IMPLANT
SYR 5ML LL (SYRINGE) IMPLANT
SYR CONTROL 10ML LL (SYRINGE) ×2 IMPLANT
TAPE CLOTH SURG 4X10 WHT LF (GAUZE/BANDAGES/DRESSINGS) ×2 IMPLANT
TOWEL GREEN STERILE (TOWEL DISPOSABLE) ×2 IMPLANT
TOWEL GREEN STERILE FF (TOWEL DISPOSABLE) ×2 IMPLANT
TRAP SPECIMEN MUCOUS 40CC (MISCELLANEOUS) ×2 IMPLANT
TRAY FOLEY BAG SILVER LF 16FR (SET/KITS/TRAYS/PACK) ×2 IMPLANT
TRAY FOLEY W/METER SILVER 16FR (SET/KITS/TRAYS/PACK) IMPLANT
WATER STERILE IRR 1000ML POUR (IV SOLUTION) ×2 IMPLANT

## 2016-08-04 NOTE — Anesthesia Postprocedure Evaluation (Addendum)
Anesthesia Post Note  Patient: Tiffany Mayo  Procedure(s) Performed: Procedure(s) (LRB): Lumbar three - Lumbar five decompressive lumbar laminectomy; Lumbar three- Lumbar four and Lumbar four- Lumbar five Posterior Lumbar Interbody Fusion,  Lumbar three - Lumbar five Posterior Lateral Arthrodesis (N/A)  Patient location during evaluation: PACU Anesthesia Type: General Level of consciousness: sedated and lethargic Pain management: pain level controlled Vital Signs Assessment: vitals unstable Cardiovascular status: unstable Postop Assessment: no signs of nausea or vomiting Anesthetic complications: no Comments: Pt is currently weaning off of neo for bp support       Last Vitals:  Vitals:   08/04/16 1500 08/04/16 1502  BP:  (!) 92/52  Pulse: (!) 57 (!) 57  Resp: (!) 9 (!) 9  Temp:      Last Pain:  Vitals:   08/04/16 1500  TempSrc:   PainSc: Asleep                 Efren Kross JR,JOHN Shine Mikes

## 2016-08-04 NOTE — Anesthesia Preprocedure Evaluation (Addendum)
Anesthesia Evaluation  Patient identified by MRN, date of birth, ID band Patient awake    Reviewed: Allergy & Precautions, H&P , NPO status , Patient's Chart, lab work & pertinent test results, reviewed documented beta blocker date and time   Airway Mallampati: I  TM Distance: >3 FB Neck ROM: full    Dental no notable dental hx.    Pulmonary neg pulmonary ROS,    Pulmonary exam normal        Cardiovascular Exercise Tolerance: Good hypertension, Pt. on medications Normal cardiovascular exam     Neuro/Psych negative psych ROS   GI/Hepatic Neg liver ROS, GERD  Medicated and Controlled,  Endo/Other    Renal/GU negative Renal ROS  negative genitourinary   Musculoskeletal   Abdominal Normal abdominal exam  (+)   Peds  Hematology negative hematology ROS (+)   Anesthesia Other Findings   Reproductive/Obstetrics negative OB ROS                             Anesthesia Physical Anesthesia Plan  ASA: II  Anesthesia Plan: General   Post-op Pain Management:    Induction: Intravenous  Airway Management Planned: Oral ETT  Additional Equipment:   Intra-op Plan:   Post-operative Plan: Extubation in OR  Informed Consent: I have reviewed the patients History and Physical, chart, labs and discussed the procedure including the risks, benefits and alternatives for the proposed anesthesia with the patient or authorized representative who has indicated his/her understanding and acceptance.   Dental Advisory Given  Plan Discussed with: CRNA and Surgeon  Anesthesia Plan Comments:         Anesthesia Quick Evaluation

## 2016-08-04 NOTE — Anesthesia Preprocedure Evaluation (Addendum)
Anesthesia Evaluation  Patient identified by MRN, date of birth, ID band Patient awake    Reviewed: Allergy & Precautions, H&P , NPO status , Patient's Chart, lab work & pertinent test results  Airway Mallampati: I  TM Distance: >3 FB Neck ROM: full    Dental no notable dental hx. (+) Teeth Intact, Dental Advidsory Given   Pulmonary neg pulmonary ROS,    Pulmonary exam normal        Cardiovascular hypertension, Pt. on medications Normal cardiovascular exam Rhythm:regular Rate:Normal     Neuro/Psych  Headaches, negative psych ROS   GI/Hepatic Neg liver ROS, GERD  Medicated and Controlled,  Endo/Other  Hypothyroidism   Renal/GU negative Renal ROS     Musculoskeletal   Abdominal Normal abdominal exam  (+)   Peds negative pediatric ROS (+)  Hematology   Anesthesia Other Findings   Reproductive/Obstetrics negative OB ROS                            Anesthesia Physical Anesthesia Plan  ASA: II  Anesthesia Plan: General   Post-op Pain Management:    Induction: Intravenous  Airway Management Planned: Oral ETT  Additional Equipment:   Intra-op Plan:   Post-operative Plan: Extubation in OR  Informed Consent: I have reviewed the patients History and Physical, chart, labs and discussed the procedure including the risks, benefits and alternatives for the proposed anesthesia with the patient or authorized representative who has indicated his/her understanding and acceptance.   Dental Advisory Given  Plan Discussed with: CRNA and Surgeon  Anesthesia Plan Comments:        Anesthesia Quick Evaluation

## 2016-08-04 NOTE — Progress Notes (Signed)
Vitals:   08/04/16 1715 08/04/16 1720 08/04/16 1725 08/04/16 1730  BP: (!) 90/50 (!) 99/51 (!) 94/53 (!) 83/42  Pulse: 65 65 64 68  Resp: 12 13 (!) 9 11  Temp:      TempSrc:      SpO2: 96% 96% 97% 96%    Patient resting in PACU, comfortable. Did receive Dilaudid 1 mg IV about an hour and 40 minutes ago for pain. Has had blood pressure ranging between 80 and 110. Did require support with Neo-Synephrine IV for a while, that is been stopped about 25 minutes ago and systolic blood pressure 80-90.  Opens eyes to voice, following commands with all 4 extremities. Good movement of lower extremities. Dressing clean and dry.  Plan: We'll check EKG, CBC with differential, and BMET, and continue to monitor in PACU for now.  Hewitt ShortsNUDELMAN,ROBERT W, MD 08/04/2016, 5:40 PM

## 2016-08-04 NOTE — Progress Notes (Signed)
Subjective: Patient sitting up slightly, ate dinner. Systolic blood pressure 108-110. EKG shows normal sinus rhythm, without concerning features.  Objective: Vital signs in last 24 hours: Vitals:   08/04/16 1920 08/04/16 1930 08/04/16 1935 08/04/16 1945  BP: (!) 85/56  96/69   Pulse: 68 69 71 72  Resp: 15 16 15 17   Temp:      TempSrc:      SpO2: 99% 98% 98% 99%    Physical Exam:  Much more awake, alert, oriented, following commands with all 4 extremities.  CBC  Recent Labs  08/04/16 1833  WBC 14.8*  HGB 9.4*  HCT 28.9*  PLT 198   BMET  Recent Labs  08/04/16 1833  NA 136  K 4.5  CL 105  CO2 24  GLUCOSE 169*  BUN 18  CREATININE 1.00  CALCIUM 8.3*    Studies/Results: Dg Lumbar Spine 2-3 Views  Result Date: 08/04/2016 CLINICAL DATA:  L3-4, L4-5 posterior fusion EXAM: LUMBAR SPINE - 2-3 VIEW COMPARISON:  MRI 06/08/2016 FINDINGS: First lateral intraoperative image demonstrates posterior needles directed at the L4 vertebral body and L1-2 interspace. Second lateral intraoperative image demonstrates posterior instruments directed at the L3 and L4 pedicles. IMPRESSION: Intraoperative localization as above. Electronically Signed   By: Charlett NoseKevin  Dover M.D.   On: 08/04/2016 14:39   Dg Lumbar Spine 2-3 Views  Result Date: 08/04/2016 CLINICAL DATA:  Post lumbar laminectomy EXAM: LUMBAR SPINE - 2-3 VIEW; DG C-ARM 61-120 MIN COMPARISON:  Lumbar spine MRI 06/08/2016 FINDINGS: Three views of the lumbar spine submitted. Transpedicular screws are noted L3, L4 and L5 level. There is anatomic alignment. Postsurgical disc spacer material at L3-L4 and L4-L5 level. Two posterior metallic clamps are noted IMPRESSION: Transpedicular screws are noted L3, L4 and L5 level. There is anatomic alignment. Fluoroscopy time was 55 seconds.  Please see the operative report. Electronically Signed   By: Natasha MeadLiviu  Pop M.D.   On: 08/04/2016 13:57   Dg C-arm 1-60 Min  Result Date: 08/04/2016 CLINICAL DATA:  Post  lumbar laminectomy EXAM: LUMBAR SPINE - 2-3 VIEW; DG C-ARM 61-120 MIN COMPARISON:  Lumbar spine MRI 06/08/2016 FINDINGS: Three views of the lumbar spine submitted. Transpedicular screws are noted L3, L4 and L5 level. There is anatomic alignment. Postsurgical disc spacer material at L3-L4 and L4-L5 level. Two posterior metallic clamps are noted IMPRESSION: Transpedicular screws are noted L3, L4 and L5 level. There is anatomic alignment. Fluoroscopy time was 55 seconds.  Please see the operative report. Electronically Signed   By: Natasha MeadLiviu  Pop M.D.   On: 08/04/2016 13:57    Assessment/Plan: Family at bedside, I have spoken with the husband and daughter-in-law several times. She has steadily improved, and EKG and labs looked good. Patient being transferred to [facility].  We'll progressively mobilize once blood pressure has further stabilized.   Hewitt ShortsNUDELMAN,ROBERT W, MD 08/04/2016, 8:12 PM

## 2016-08-04 NOTE — Transfer of Care (Signed)
Immediate Anesthesia Transfer of Care Note  Patient: Dyann Kiefeggy B Huckeby  Procedure(s) Performed: Procedure(s): Lumbar three - Lumbar five decompressive lumbar laminectomy; Lumbar three- Lumbar four and Lumbar four- Lumbar five Posterior Lumbar Interbody Fusion,  Lumbar three - Lumbar five Posterior Lateral Arthrodesis (N/A)  Patient Location: PACU  Anesthesia Type:General  Level of Consciousness: awake, alert  and oriented  Airway & Oxygen Therapy: Patient Spontanous Breathing and Patient connected to nasal cannula oxygen  Post-op Assessment: Report given to RN, Post -op Vital signs reviewed and stable and Patient moving all extremities X 4  Post vital signs: Reviewed and stable  Last Vitals:  Vitals:   08/04/16 0729  BP: 121/60  Pulse: (!) 57  Resp: 16  Temp: 36.6 C    Last Pain:  Vitals:   08/04/16 0729  TempSrc: Oral  PainSc:       Patients Stated Pain Goal: 3 (07/28/16 1333)  Complications: No apparent anesthesia complications

## 2016-08-04 NOTE — H&P (Signed)
Subjective: Patient is a 76 y.o. right-handed white female who is admitted for treatment of disabling left-sided low back pain with left lumbar radicular pain radiating down through the left thigh, leg, and foot, with muscular cramping to the left lower extremity. 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 At L3-4 the listhesis is grade 1, and at L4-L5 it is grade 2, at both it is dynamic. MRI scan shows that the T12-L1 level is unremarkable there is minimal degeneration at the L5-S1 level. There is mild degeneration at the L1-2 and L2-3 levels. However at L3-4 this advanced bilateral facet arthropathy with limited flavum thickening and marked to now stenosis and marked to severe lateral recess stenosis. At L4-5 there is the worst degeneration with severe bilateral facet arthropathy and what appears to be diastases of the facet joints. There is severe stenosis at L4-5 multifactorial in nature, contributed to by the spondylolisthesis, disc protrusion, facet hypertrophy, and ligamentum flavum hypertrophy. Patient is admitted now for a lumbar decompression and stabilization including an L3-L5 decompressive lumbar laminectomy, including bile facetectomy and foraminotomies for the L3, L4, and L5 nerve roots, as well as bilateral L3-4 and L4-5 posterior lumbar interbody arthrodesis with interbody implants and bone graft, and bilateral L3-L5 posterior lateral arthrodesis with posterior instrumentation and bone graft.    Past Medical History:  Diagnosis Date  . Arthritis    spine, neck  . GERD (gastroesophageal reflux disease)   . Headache    SINUS   . Hemorrhoid   . Hyperlipemia   . Hypertension   . Hypothyroidism     Past Surgical History:  Procedure Laterality Date  . ABDOMINAL HYSTERECTOMY  1990  . ANTERIOR CERVICAL DECOMP/DISCECTOMY FUSION  04/03/2012   Procedure: ANTERIOR CERVICAL DECOMPRESSION/DISCECTOMY FUSION 1 LEVEL;  Surgeon: Hewitt Shorts, MD;  Location: [facility]  NEURO ORS;  Service: Neurosurgery;  Laterality: N/A;  Cervical Five-Six Anterior Cervical Decompression with Fusion Plating and Bonegraft  . BUNIONECTOMY     RIGHT FOOT  . EYE SURGERY     Lasik, Catarct  . GANGLION CYST EXCISION     BIL WRIST   . JOINT REPLACEMENT     Right middle finger  . TRIGGER FINGER RELEASE     thumbs    Prescriptions Prior to Admission  Medication Sig Dispense Refill Last Dose  . Cholecalciferol (VITAMIN D3) 1000 units CAPS Take 1,000 Units by mouth daily.    Past Month at Unknown time  . ibuprofen (ADVIL,MOTRIN) 200 MG tablet Take 200 mg by mouth every 6 (six) hours as needed for headache.   Past Month at Unknown time  . levothyroxine (SYNTHROID, LEVOTHROID) 25 MCG tablet Take 25 mcg by mouth daily.   08/04/2016 at 0600  . loratadine (CLARITIN) 10 MG tablet Take 10 mg by mouth daily as needed for allergies.   Past Month at Unknown time  . losartan-hydrochlorothiazide (HYZAAR) 50-12.5 MG per tablet Take 1 tablet by mouth daily.   08/04/2016 at 0600  . Multiple Vitamins-Minerals (HAIR SKIN AND NAILS FORMULA) TABS Take 2 tablets by mouth daily.   Past Week at Unknown time  . omeprazole (PRILOSEC) 20 MG capsule Take 20 mg by mouth daily.   08/04/2016 at 0600   Allergies  Allergen Reactions  . Latex Rash  . Codeine Nausea And Vomiting  . Sulfa Antibiotics Nausea And Vomiting    Social History  Substance Use Topics  . Smoking status: Never Smoker  . Smokeless tobacco: Never Used  . Alcohol use Yes  Comment: OCC WINE     History reviewed. No pertinent family history.   Review of Systems A comprehensive review of systems was negative.  Objective: Vital signs in last 24 hours: Temp:  [97.8 F (36.6 C)] 97.8 F (36.6 C) (03/07 0729) Pulse Rate:  [57] 57 (03/07 0729) Resp:  [16] 16 (03/07 0729) BP: (121)/(60) 121/60 (03/07 0729) SpO2:  [99 %] 99 % (03/07 0729)  EXAM: Patient is well-developed well-nourished white female in no acute distress. Lungs are  clear to auscultation , the patient has symmetrical respiratory excursion. Heart has a regular rate and rhythm normal S1 and S2 no murmur.   Abdomen is soft nontender nondistended bowel sounds are present. Extremity examination shows no clubbing cyanosis or edema. Motor examination shows 5 over 5 strength in the lower extremities including the iliopsoas quadriceps dorsiflexor extensor hallicus  longus and plantar flexor bilaterally. Sensation is intact to pinprick in the distal lower extremities. Reflexes are symmetrical bilaterally. No pathologic reflexes are present. Patient has a normal gait and stance.   Data Review:CBC    Component Value Date/Time   WBC 6.9 07/28/2016 1401   RBC 3.88 07/28/2016 1401   HGB 12.3 07/28/2016 1401   HCT 37.5 07/28/2016 1401   PLT 277 07/28/2016 1401   MCV 96.6 07/28/2016 1401   MCH 31.7 07/28/2016 1401   MCHC 32.8 07/28/2016 1401   RDW 13.5 07/28/2016 1401   LYMPHSABS 2.0 03/05/2016 2047   MONOABS 0.5 03/05/2016 2047   EOSABS 0.2 03/05/2016 2047   BASOSABS 0.0 03/05/2016 2047                          BMET    Component Value Date/Time   NA 140 07/28/2016 1401   K 4.1 07/28/2016 1401   CL 103 07/28/2016 1401   CO2 30 07/28/2016 1401   GLUCOSE 85 07/28/2016 1401   BUN 16 07/28/2016 1401   CREATININE 0.91 07/28/2016 1401   CALCIUM 9.5 07/28/2016 1401   GFRNONAA 60 (L) 07/28/2016 1401   GFRAA >60 07/28/2016 1401     Assessment/Plan: Patient with advanced degeneration at the L3-4 and L4-5 levels is admitted for lumbar decompression and stabilization.  I've discussed with the patient the nature of his condition, the nature the surgical procedure, the typical length of surgery, hospital stay, and overall recuperation, the limitations postoperatively, and risks of surgery. I discussed risks including risks of infection, bleeding, possibly need for transfusion, the risk of nerve root dysfunction with pain, weakness, numbness, or paresthesias, the risk of  dural tear and CSF leakage and possible need for further surgery, the risk of failure of the arthrodesis and possibly for further surgery, the risk of anesthetic complications including myocardial infarction, stroke, pneumonia, and death. We discussed the need for postoperative immobilization in a lumbar brace. Understanding all this the patient does wish to proceed with surgery and is admitted for such.     Hewitt ShortsNUDELMAN,ROBERT W, MD 08/04/2016 8:05 AM

## 2016-08-04 NOTE — Anesthesia Procedure Notes (Signed)
Procedure Name: Intubation Date/Time: 08/04/2016 8:34 AM Performed by: Neldon Newport Pre-anesthesia Checklist: Timeout performed, Patient being monitored, Suction available, Emergency Drugs available and Patient identified Patient Re-evaluated:Patient Re-evaluated prior to inductionOxygen Delivery Method: Circle system utilized Preoxygenation: Pre-oxygenation with 100% oxygen Intubation Type: IV induction Ventilation: Mask ventilation without difficulty Laryngoscope Size: Mac and 3 Grade View: Grade I Tube type: Oral Tube size: 7.0 mm Number of attempts: 1 Placement Confirmation: breath sounds checked- equal and bilateral,  positive ETCO2 and ETT inserted through vocal cords under direct vision Secured at: 22 cm Tube secured with: Tape Dental Injury: Teeth and Oropharynx as per pre-operative assessment

## 2016-08-04 NOTE — Op Note (Signed)
08/04/2016  2:16 PM  PATIENT:  Tiffany Mayo  76 y.o. female  PRE-OPERATIVE DIAGNOSIS:  Multilevel, multifactorial lumbar stenosis with neurogenic claudication; degenerative lumbar scoliosis; lumbar spondylosis; lumbar degenerative disease  POST-OPERATIVE DIAGNOSIS:  Multilevel, multifactorial lumbar stenosis with neurogenic claudication; degenerative lumbar scoliosis; lumbar spondylosis; lumbar degenerative disease  PROCEDURE:  Procedure(s):  Lumbar three - Lumbar five decompressive lumbar laminectomy, bilateral facetectomy, and foraminotomies for decompression of the canal stenosis and neural foraminal stenosis with decompression of the exiting L3, L4, and L5 nerve roots; Lumbar three- Lumbar four and Lumbar four- Lumbar five Posterior Lumbar Interbody Fusion, with AVS interbody implants, Vitoss BA with bone marrow aspirate, and infuse;  Lumbar three - Lumbar five Posterior Lateral Arthrodesis with segmental radius posterior instrumentation, locally harvested morcellized autograft, Vitoss BA with bone marrow aspirate, and infuse  SURGEON:  Surgeon(s): Shirlean Kelly, MD Tressie Stalker, MD  ASSISTANTS: Tressie Stalker, M.D.  ANESTHESIA:   general  EBL:  Total I/O In: 2860 [I.V.:2400; Blood:210; IV Piggyback:250] Out: 1150 [Urine:600; Blood:550]  BLOOD ADMINISTERED:  None   CELL SAVER GIVEN: 210 mL  COUNT: Correct per nursing staff  DICTATION: Patient was brought to the operating room placed under general endotracheal anesthesia. The patient was turned to prone position, the lumbar region was prepped with Betadine soap and solution and draped in a sterile fashion. The midline was infiltrated with local anesthesia with epinephrine. A localizing x-ray was taken and then a midline incision was made and carried down through the subcutaneous tissue, bipolar cautery and electrocautery were used to maintain hemostasis. Dissection was carried down to the lumbar fascia. The fascia was incised  bilaterally and the paraspinal muscles were dissected with a spinous process and lamina in a subperiosteal fashion. Another x-ray was taken for localization and the L3, L4, and L5 levels were localized. Dissection was then carried out laterally over the facet complexes and the transverse processes of L3, L4, and L5 were exposed and decorticated.  We then proceeded with the decompression. Laminectomy was performed using double-action rongeurs, the high-speed drill and Kerrison punches. There is marked bony overgrowth and ligamentum flavum thickening that was carefully removed decompressing the thecal sac, lateral recess, and the exiting nerve roots. Dissection was carried out laterally including facetectomy and foraminotomies with decompression of the stenotic compression of the exiting L3, L4, and L5 nerve roots. Once the decompression of the stenotic compression of the thecal sac and exiting nerve roots was completed we proceeded with the posterior lumbar interbody arthrodesis. The annulus at each level was incised bilaterally and the disc space entered. A thorough discectomy was performed using pituitary rongeurs and curettes. Once the discectomy was completed we began to prepare the endplate surfaces, removing the cartilaginous endplates surface. We then measured the height of the intervertebral disc space. We selected 10 x 25 x 4 AVS peek interbody implants for the L3-4 level, and 10 x 25 x 4 AVS peek interbody implants for the L4-5 level.  The C-arm fluoroscope was then draped and brought in the field and we identified the pedicle entry points bilaterally at the L3, L4, and L5 levels. Each of the 6 pedicles was probed, we aspirated bone marrow aspirate from the vertebral bodies, this was injected over two 10 cc strips of Vitoss BA. Then each of the pedicles was examined with the ball probe, good bony surfaces were found and no bony cuts were found. Each of the pedicles was then tapped with a 5.25 mm tap,  again examined with the  ball probe good threading was found and no bony cuts were found. We then placed 5.75 by 45 millimeter screws bilaterally at the L3 level, 5.75 by 45 millimeter screws bilaterally at the L4 level, and 5.75 by 45 millimeter screws bilaterally at the L5 level.  We then packed the AVS peek interbody implants with Vitoss BA with bone marrow aspirate and infuse, and then placed the first implant at the L4-5 level on the right side, carefully retracting the thecal sac and nerve root medially. We then went back to the left side and packed the midline with additional Vitoss BA with bone marrow aspirate and infuse, and then placed a second implant on the left side again retracting the thecal sac and nerve root medially. Additional Vitoss BA with bone marrow aspirate and infuse was packed lateral to the implants.  Then at the L3-4 level, we placed the first implant on the right side, carefully retracting the thecal sac and nerve root medially. We then went back to the left side and packed the midline with additional Vitoss BA with bone marrow aspirate and infuse, and then placed a second implant on the left side, again retracting the thecal sac and nerve root medially. Additional Vitoss BA with bone marrow aspirate and infuse was packed lateral to the implants.   We then packed the lateral gutter over the transverse processes and intertransverse space with locally harvested morcellized autograft, Vitoss BA with bone marrow aspirate, and infuse. We then selected hyper lordosed 50 mm rods, they were placed within the screw heads and secured with locking caps once all 6 locking caps were placed final tightening was performed against a counter torque.  We then selected a medium variable cross link that was secured to the rods in each side between the screws at L4 and L5. Once the cross-link was secured to the rods, the central tightening collar was tightened down.  The wound had been irrigated  multiple times during the procedure with saline solution and bacitracin solution, good hemostasis was established with a combination of bipolar cautery and Gelfoam with thrombin. The Gelfoam was removed, and a thin layer Surgifoam applied. Once good hemostasis was confirmed we proceeded with closure paraspinal muscles deep fascia and Scarpa's fascia were closed with interrupted undyed 1 Vicryl sutures the subcutaneous and subcuticular closed with interrupted inverted 2-0 undyed Vicryl sutures the skin edges were approximated with Dermabond.  The wound was dressed with sterile gauze and Hypafix.  Following surgery the patient was turned back to the supine position to be reversed and the anesthetic extubated and transferred to the recovery room for further care.   PLAN OF CARE: Admit to inpatient   PATIENT DISPOSITION:  PACU - hemodynamically stable.   Delay start of Pharmacological VTE agent (>24hrs) due to surgical blood loss or risk of bleeding:  yes

## 2016-08-05 LAB — POCT I-STAT EG7
Acid-Base Excess: 1 mmol/L (ref 0.0–2.0)
Bicarbonate: 24.8 mmol/L (ref 20.0–28.0)
Calcium, Ion: 1.15 mmol/L (ref 1.15–1.40)
HCT: 28 % — ABNORMAL LOW (ref 36.0–46.0)
Hemoglobin: 9.5 g/dL — ABNORMAL LOW (ref 12.0–15.0)
O2 Saturation: 96 %
Potassium: 3.7 mmol/L (ref 3.5–5.1)
Sodium: 138 mmol/L (ref 135–145)
TCO2: 26 mmol/L (ref 0–100)
pCO2, Ven: 34 mmHg — ABNORMAL LOW (ref 44.0–60.0)
pH, Ven: 7.47 — ABNORMAL HIGH (ref 7.250–7.430)
pO2, Ven: 76 mmHg — ABNORMAL HIGH (ref 32.0–45.0)

## 2016-08-05 MED FILL — Thrombin For Soln 20000 Unit: CUTANEOUS | Qty: 1 | Status: AC

## 2016-08-06 MED ORDER — HYDROCODONE-ACETAMINOPHEN 5-325 MG PO TABS: 1.0000 | ORAL_TABLET | ORAL | 0 refills | Status: DC | PRN

## 2016-08-06 NOTE — Progress Notes (Signed)
Patient is discharged from room 3C11 at this time. Alert and in stable condition. IV site d/c'd and instructions read to patient and husband with understanding verbalized and all questions answered. Left unit via wheelchair with all belongings at side.  

## 2016-08-06 NOTE — Discharge Summary (Signed)
Physician Discharge Summary  Patient ID: Tiffany Mayo MRN: [id number] DOB/AGE: [birth year] 76 y.o.  Admit date: 08/04/2016 Discharge date: 08/06/2016  Admission Diagnoses:  Multilevel, multifactorial lumbar stenosis with neurogenic claudication; degenerative lumbar scoliosis; lumbar spondylosis; lumbar degenerative disease  Discharge Diagnoses:  Multilevel, multifactorial lumbar stenosis with neurogenic claudication; degenerative lumbar scoliosis; lumbar spondylosis; lumbar degenerative disease Active Problems:   Lumbar stenosis   Discharged Condition: good  Hospital Course: Patient admitted, underwent a L3-L5 lumbar laminectomy, L3-4 and L4-5 PLIF, and L3-L5 PLA. She is done well following surgery. She is up and ambulating actively. Her dressing was removed, and her incision is healing nicely. There is no erythema, swelling, or drainage. She is being discharged to home with instructions regarding wound care and activities. She is to follow-up with me in the office in 3 weeks.  Discharge Exam: Blood pressure (!) 122/46, pulse 68, temperature 98.2 F (36.8 C), temperature source Oral, resp. rate 18, SpO2 99 %.  Disposition: 01-Home or Self Care  Discharge Instructions    Discharge wound care:    Complete by:  As directed    Leave the wound open to air. Shower daily with the wound uncovered. Water and soapy water should run over the incision area. Do not wash directly on the incision for 2 weeks. Remove the glue after 2 weeks.   Driving Restrictions    Complete by:  As directed    No driving for 2 weeks. May ride in the car locally now. May begin to drive locally in 2 weeks.   Other Restrictions    Complete by:  As directed    Walk gradually increasing distances out in the fresh air at least twice a day. Walking additional 6 times inside the house, gradually increasing distances, daily. No bending, lifting, or twisting. Perform activities between shoulder and waist height (that is at  counter height when standing or table height when sitting).     Allergies as of 08/06/2016      Reactions   Latex Rash   Codeine Nausea And Vomiting   Sulfa Antibiotics Nausea And Vomiting      Medication List    TAKE these medications   HAIR SKIN AND NAILS FORMULA Tabs Take 2 tablets by mouth daily.   HYDROcodone-acetaminophen 5-325 MG tablet Commonly known as:  NORCO/VICODIN Take 1-2 tablets by mouth every 4 (four) hours as needed for moderate pain.   ibuprofen 200 MG tablet Commonly known as:  ADVIL,MOTRIN Take 200 mg by mouth every 6 (six) hours as needed for headache.   levothyroxine 25 MCG tablet Commonly known as:  SYNTHROID, LEVOTHROID Take 25 mcg by mouth daily.   loratadine 10 MG tablet Commonly known as:  CLARITIN Take 10 mg by mouth daily as needed for allergies.   losartan-hydrochlorothiazide 50-12.5 MG tablet Commonly known as:  HYZAAR Take 1 tablet by mouth daily.   omeprazole 20 MG capsule Commonly known as:  PRILOSEC Take 20 mg by mouth daily.   Vitamin D3 1000 units Caps Take 1,000 Units by mouth daily.        SignedHewitt Shorts: NUDELMAN,ROBERT W 08/06/2016, 8:08 AM

## 2016-08-09 MED FILL — Sodium Chloride IV Soln 0.9%: INTRAVENOUS | Qty: 2000 | Status: AC

## 2016-08-09 MED FILL — Heparin Sodium (Porcine) Inj 1000 Unit/ML: INTRAMUSCULAR | Qty: 30 | Status: AC

## 2016-11-05 NOTE — Addendum Note (Signed)
Addendum  created 11/05/16 1042 by Kutter Schnepf, MD   Sign clinical note    

## 2017-08-22 ENCOUNTER — Ambulatory Visit (HOSPITAL_COMMUNITY)
Admission: EM | Admit: 2017-08-22 | Discharge: 2017-08-22 | Disposition: A | Discharge status: Home / Self Care | Payer: Medicare Other | Attending: Urgent Care | Admitting: Urgent Care

## 2017-08-22 ENCOUNTER — Encounter (HOSPITAL_COMMUNITY): Payer: Self-pay | Admitting: Family Medicine

## 2017-08-22 ENCOUNTER — Ambulatory Visit (INDEPENDENT_AMBULATORY_CARE_PROVIDER_SITE_OTHER): Payer: Medicare Other

## 2017-08-22 DIAGNOSIS — M545 Low back pain, unspecified: Secondary | ICD-10-CM

## 2017-08-22 DIAGNOSIS — Z9889 Other specified postprocedural states: Secondary | ICD-10-CM

## 2017-08-22 DIAGNOSIS — M25552 Pain in left hip: Secondary | ICD-10-CM

## 2017-08-22 DIAGNOSIS — M79652 Pain in left thigh: Secondary | ICD-10-CM

## 2017-08-22 DIAGNOSIS — W19XXXA Unspecified fall, initial encounter: Secondary | ICD-10-CM

## 2017-08-22 DIAGNOSIS — M25562 Pain in left knee: Secondary | ICD-10-CM | POA: Diagnosis not present

## 2017-08-22 DIAGNOSIS — M7918 Myalgia, other site: Secondary | ICD-10-CM

## 2017-08-22 MED ORDER — TIZANIDINE HCL 4 MG PO CAPS: 4.0000 mg | ORAL_CAPSULE | Freq: Every evening | ORAL | 0 refills | Status: DC | PRN

## 2017-08-22 NOTE — ED Provider Notes (Signed)
MRN: [id number] DOB: 11-03-1940  Subjective:   Tiffany Mayo is a 77 y.o. female presenting for 1 day history of left hip, thigh and posterior knee pain s/p fall yesterday. Patient states that her dog crossed in front of her and she fell forward in a twisting motion. States that she tried to catch herself, twisted and fell on her buttock. She has since had constant left thigh pain, feels like there is a knot deep within her thigh, pain shoots up her leg and into her left hip, is worse with walking. Has posterior knee pain that is not as bad. Has tried icing, ibuprofen with some relief.  No current facility-administered medications for this encounter.   Current Outpatient Medications:  .  Cholecalciferol (VITAMIN D3) 1000 units CAPS, Take 1,000 Units by mouth daily. , Disp: , Rfl:  .  ibuprofen (ADVIL,MOTRIN) 200 MG tablet, Take 200 mg by mouth every 6 (six) hours as needed for headache., Disp: , Rfl:  .  levothyroxine (SYNTHROID, LEVOTHROID) 25 MCG tablet, Take 25 mcg by mouth daily., Disp: , Rfl:  .  loratadine (CLARITIN) 10 MG tablet, Take 10 mg by mouth daily as needed for allergies., Disp: , Rfl:  .  losartan-hydrochlorothiazide (HYZAAR) 50-12.5 MG per tablet, Take 1 tablet by mouth daily., Disp: , Rfl:  .  Multiple Vitamins-Minerals (HAIR SKIN AND NAILS FORMULA) TABS, Take 2 tablets by mouth daily., Disp: , Rfl:  .  omeprazole (PRILOSEC) 20 MG capsule, Take 20 mg by mouth daily., Disp: , Rfl:    Allergies  Allergen Reactions  . Latex Rash  . Codeine Nausea And Vomiting  . Sulfa Antibiotics Nausea And Vomiting    Past Medical History:  Diagnosis Date  . Arthritis    spine, neck  . GERD (gastroesophageal reflux disease)   . Headache    SINUS   . Hemorrhoid   . Hyperlipemia   . Hypertension   . Hypothyroidism      Past Surgical History:  Procedure Laterality Date  . ABDOMINAL HYSTERECTOMY  1990  . ANTERIOR CERVICAL DECOMP/DISCECTOMY FUSION  04/03/2012   Procedure: ANTERIOR  CERVICAL DECOMPRESSION/DISCECTOMY FUSION 1 LEVEL;  Surgeon: Hewitt Shorts, MD;  Location: [facility];  Service: Neurosurgery;  Laterality: N/A;  Cervical Five-Six Anterior Cervical Decompression with Fusion Plating and Bonegraft  . BUNIONECTOMY     RIGHT FOOT  . EYE SURGERY     Lasik, Catarct  . GANGLION CYST EXCISION     BIL WRIST   . JOINT REPLACEMENT     Right middle finger  . TRIGGER FINGER RELEASE     thumbs   Objective:   Vitals: BP 130/71   Pulse 74   Temp 98.4 F (36.9 C)   Resp 18   SpO2 100%   Physical Exam  Constitutional: She is oriented to person, place, and time. She appears well-developed and well-nourished.  Cardiovascular: Normal rate.  Pulmonary/Chest: Effort normal.  Musculoskeletal:       Left hip: She exhibits tenderness (over areas depicted). She exhibits normal range of motion, normal strength, no bony tenderness, no swelling, no crepitus, no deformity and no laceration.       Left knee: She exhibits normal range of motion, no swelling, no deformity, no laceration, no erythema, normal alignment, normal patellar mobility and no bony tenderness. No tenderness found.       Legs: Neurological: She is alert and oriented to person, place, and time. She displays normal reflexes. Coordination (ambulating gingerly favoring her left  thigh/hip) abnormal.  Skin: Skin is warm and dry.   Dg Pelvis 1-2 Views  Result Date: 08/22/2017 CLINICAL DATA:  Left hip pain.  Fall EXAM: PELVIS - 1-2 VIEW COMPARISON:  Lumbar spine radiographs 12/17/2016, 06/21/2017 FINDINGS: Negative for fracture.  Both hip joints normal Lumbar fusion L3-4 and L4-5. IMPRESSION: Negative for fracture. Electronically Signed   By: Marlan Palauharles  Clark M.D.   On: 08/22/2017 16:47   Assessment and Plan :   Fall, initial encounter  Buttock pain  Pain of left thigh  Left hip pain  Posterior left knee pain  Acute midline low back pain without sciatica  History of back surgery  Physical exam  and radiology report reassuring. Patient declined Tylenol #3 due to adverse effects with codeine. She will try scheduling APAP, start tizanidine. Return-to-clinic precautions discussed, patient verbalized understanding.    Wallis BambergMani, Kaydan Wilhoite, [place]PA-C 08/22/17 1709

## 2017-08-22 NOTE — Discharge Instructions (Addendum)
You may take 500mg Tylenol every 6 hours for pain and inflammation. ° °

## 2017-08-22 NOTE — ED Triage Notes (Signed)
Pt here for fall. She tripped over her sons dog yesterday and twisted her left leg, hip and lower back. She took ibuprofen with some relief. Pt ambulated to triage.

## 2019-02-13 ENCOUNTER — Other Ambulatory Visit: Payer: Self-pay

## 2019-02-13 DIAGNOSIS — Z20822 Contact with and (suspected) exposure to covid-19: Secondary | ICD-10-CM

## 2019-02-15 LAB — NOVEL CORONAVIRUS, NAA: SARS-CoV-2, NAA: NOT DETECTED

## 2019-05-02 DIAGNOSIS — M25561 Pain in right knee: Secondary | ICD-10-CM | POA: Insufficient documentation

## 2019-08-29 ENCOUNTER — Other Ambulatory Visit: Payer: Self-pay | Admitting: Internal Medicine

## 2019-08-29 DIAGNOSIS — Z1231 Encounter for screening mammogram for malignant neoplasm of breast: Secondary | ICD-10-CM

## 2019-09-19 ENCOUNTER — Ambulatory Visit
Admission: RE | Admit: 2019-09-19 | Discharge: 2019-09-19 | Disposition: A | Discharge status: Home / Self Care | Payer: Medicare PPO | Source: Ambulatory Visit | Attending: Internal Medicine | Admitting: Internal Medicine

## 2019-09-19 ENCOUNTER — Other Ambulatory Visit: Payer: Self-pay

## 2019-09-19 DIAGNOSIS — Z1231 Encounter for screening mammogram for malignant neoplasm of breast: Secondary | ICD-10-CM

## 2019-10-23 DIAGNOSIS — Z961 Presence of intraocular lens: Secondary | ICD-10-CM | POA: Diagnosis not present

## 2019-10-23 DIAGNOSIS — H40013 Open angle with borderline findings, low risk, bilateral: Secondary | ICD-10-CM | POA: Diagnosis not present

## 2019-10-23 DIAGNOSIS — H52223 Regular astigmatism, bilateral: Secondary | ICD-10-CM | POA: Diagnosis not present

## 2019-10-23 DIAGNOSIS — H5203 Hypermetropia, bilateral: Secondary | ICD-10-CM | POA: Diagnosis not present

## 2019-10-23 DIAGNOSIS — H04123 Dry eye syndrome of bilateral lacrimal glands: Secondary | ICD-10-CM | POA: Diagnosis not present

## 2019-11-05 DIAGNOSIS — H18453 Nodular corneal degeneration, bilateral: Secondary | ICD-10-CM | POA: Diagnosis not present

## 2019-11-08 DIAGNOSIS — H18452 Nodular corneal degeneration, left eye: Secondary | ICD-10-CM | POA: Diagnosis not present

## 2020-01-14 DIAGNOSIS — M79642 Pain in left hand: Secondary | ICD-10-CM | POA: Diagnosis not present

## 2020-01-14 DIAGNOSIS — S52502A Unspecified fracture of the lower end of left radius, initial encounter for closed fracture: Secondary | ICD-10-CM | POA: Diagnosis not present

## 2020-01-17 DIAGNOSIS — M545 Low back pain: Secondary | ICD-10-CM | POA: Diagnosis not present

## 2020-01-21 DIAGNOSIS — S22080A Wedge compression fracture of T11-T12 vertebra, initial encounter for closed fracture: Secondary | ICD-10-CM | POA: Diagnosis not present

## 2020-01-21 DIAGNOSIS — Z6826 Body mass index (BMI) 26.0-26.9, adult: Secondary | ICD-10-CM | POA: Diagnosis not present

## 2020-01-21 DIAGNOSIS — I10 Essential (primary) hypertension: Secondary | ICD-10-CM | POA: Diagnosis not present

## 2020-01-25 DIAGNOSIS — M25532 Pain in left wrist: Secondary | ICD-10-CM | POA: Diagnosis not present

## 2020-02-22 DIAGNOSIS — M25532 Pain in left wrist: Secondary | ICD-10-CM | POA: Diagnosis not present

## 2020-03-21 DIAGNOSIS — M25562 Pain in left knee: Secondary | ICD-10-CM | POA: Diagnosis not present

## 2020-03-21 DIAGNOSIS — M1712 Unilateral primary osteoarthritis, left knee: Secondary | ICD-10-CM | POA: Diagnosis not present

## 2020-03-26 DIAGNOSIS — I10 Essential (primary) hypertension: Secondary | ICD-10-CM | POA: Diagnosis not present

## 2020-03-26 DIAGNOSIS — S22080D Wedge compression fracture of T11-T12 vertebra, subsequent encounter for fracture with routine healing: Secondary | ICD-10-CM | POA: Diagnosis not present

## 2020-03-26 DIAGNOSIS — Z6826 Body mass index (BMI) 26.0-26.9, adult: Secondary | ICD-10-CM | POA: Diagnosis not present

## 2020-03-31 DIAGNOSIS — M1711 Unilateral primary osteoarthritis, right knee: Secondary | ICD-10-CM | POA: Diagnosis not present

## 2020-03-31 DIAGNOSIS — M25561 Pain in right knee: Secondary | ICD-10-CM | POA: Diagnosis not present

## 2020-04-04 DIAGNOSIS — M25561 Pain in right knee: Secondary | ICD-10-CM | POA: Diagnosis not present

## 2020-04-08 DIAGNOSIS — S83241D Other tear of medial meniscus, current injury, right knee, subsequent encounter: Secondary | ICD-10-CM | POA: Diagnosis not present

## 2020-04-08 DIAGNOSIS — M25561 Pain in right knee: Secondary | ICD-10-CM | POA: Diagnosis not present

## 2020-04-23 DIAGNOSIS — Z01818 Encounter for other preprocedural examination: Secondary | ICD-10-CM | POA: Diagnosis not present

## 2020-04-23 DIAGNOSIS — M1990 Unspecified osteoarthritis, unspecified site: Secondary | ICD-10-CM | POA: Diagnosis not present

## 2020-04-23 DIAGNOSIS — I10 Essential (primary) hypertension: Secondary | ICD-10-CM | POA: Diagnosis not present

## 2020-04-23 DIAGNOSIS — E039 Hypothyroidism, unspecified: Secondary | ICD-10-CM | POA: Diagnosis not present

## 2020-04-23 DIAGNOSIS — E7800 Pure hypercholesterolemia, unspecified: Secondary | ICD-10-CM | POA: Diagnosis not present

## 2020-05-02 DIAGNOSIS — E039 Hypothyroidism, unspecified: Secondary | ICD-10-CM | POA: Diagnosis not present

## 2020-05-02 DIAGNOSIS — E7800 Pure hypercholesterolemia, unspecified: Secondary | ICD-10-CM | POA: Diagnosis not present

## 2020-05-05 DIAGNOSIS — S83281A Other tear of lateral meniscus, current injury, right knee, initial encounter: Secondary | ICD-10-CM | POA: Diagnosis not present

## 2020-05-05 DIAGNOSIS — M2241 Chondromalacia patellae, right knee: Secondary | ICD-10-CM | POA: Diagnosis not present

## 2020-05-05 DIAGNOSIS — M948X6 Other specified disorders of cartilage, lower leg: Secondary | ICD-10-CM | POA: Diagnosis not present

## 2020-05-05 DIAGNOSIS — M94261 Chondromalacia, right knee: Secondary | ICD-10-CM | POA: Diagnosis not present

## 2020-05-05 DIAGNOSIS — S83231A Complex tear of medial meniscus, current injury, right knee, initial encounter: Secondary | ICD-10-CM | POA: Diagnosis not present

## 2020-05-05 DIAGNOSIS — M1711 Unilateral primary osteoarthritis, right knee: Secondary | ICD-10-CM | POA: Diagnosis not present

## 2020-05-05 DIAGNOSIS — G8918 Other acute postprocedural pain: Secondary | ICD-10-CM | POA: Diagnosis not present

## 2020-05-09 DIAGNOSIS — H538 Other visual disturbances: Secondary | ICD-10-CM | POA: Diagnosis not present

## 2020-05-09 DIAGNOSIS — J309 Allergic rhinitis, unspecified: Secondary | ICD-10-CM | POA: Diagnosis not present

## 2020-05-09 DIAGNOSIS — I10 Essential (primary) hypertension: Secondary | ICD-10-CM | POA: Diagnosis not present

## 2020-05-09 DIAGNOSIS — E7800 Pure hypercholesterolemia, unspecified: Secondary | ICD-10-CM | POA: Diagnosis not present

## 2020-05-09 DIAGNOSIS — E039 Hypothyroidism, unspecified: Secondary | ICD-10-CM | POA: Diagnosis not present

## 2020-05-09 DIAGNOSIS — Z0000 Encounter for general adult medical examination without abnormal findings: Secondary | ICD-10-CM | POA: Diagnosis not present

## 2020-05-09 DIAGNOSIS — K219 Gastro-esophageal reflux disease without esophagitis: Secondary | ICD-10-CM | POA: Diagnosis not present

## 2020-05-09 DIAGNOSIS — M1990 Unspecified osteoarthritis, unspecified site: Secondary | ICD-10-CM | POA: Diagnosis not present

## 2020-05-09 DIAGNOSIS — M8580 Other specified disorders of bone density and structure, unspecified site: Secondary | ICD-10-CM | POA: Diagnosis not present

## 2020-05-28 DIAGNOSIS — R82998 Other abnormal findings in urine: Secondary | ICD-10-CM | POA: Diagnosis not present

## 2020-05-28 DIAGNOSIS — I10 Essential (primary) hypertension: Secondary | ICD-10-CM | POA: Diagnosis not present

## 2020-05-29 DIAGNOSIS — N3000 Acute cystitis without hematuria: Secondary | ICD-10-CM | POA: Diagnosis not present

## 2020-05-29 DIAGNOSIS — Z1152 Encounter for screening for COVID-19: Secondary | ICD-10-CM | POA: Diagnosis not present

## 2020-05-29 DIAGNOSIS — R509 Fever, unspecified: Secondary | ICD-10-CM | POA: Diagnosis not present

## 2020-05-29 DIAGNOSIS — R300 Dysuria: Secondary | ICD-10-CM | POA: Diagnosis not present

## 2020-05-29 DIAGNOSIS — R059 Cough, unspecified: Secondary | ICD-10-CM | POA: Diagnosis not present

## 2020-06-11 DIAGNOSIS — D1801 Hemangioma of skin and subcutaneous tissue: Secondary | ICD-10-CM | POA: Diagnosis not present

## 2020-06-11 DIAGNOSIS — L821 Other seborrheic keratosis: Secondary | ICD-10-CM | POA: Diagnosis not present

## 2020-06-11 DIAGNOSIS — L570 Actinic keratosis: Secondary | ICD-10-CM | POA: Diagnosis not present

## 2020-06-11 DIAGNOSIS — L814 Other melanin hyperpigmentation: Secondary | ICD-10-CM | POA: Diagnosis not present

## 2020-06-11 DIAGNOSIS — D225 Melanocytic nevi of trunk: Secondary | ICD-10-CM | POA: Diagnosis not present

## 2020-09-22 DIAGNOSIS — Z20822 Contact with and (suspected) exposure to covid-19: Secondary | ICD-10-CM | POA: Diagnosis not present

## 2020-09-22 DIAGNOSIS — Z03818 Encounter for observation for suspected exposure to other biological agents ruled out: Secondary | ICD-10-CM | POA: Diagnosis not present

## 2020-10-02 DIAGNOSIS — S30860A Insect bite (nonvenomous) of lower back and pelvis, initial encounter: Secondary | ICD-10-CM | POA: Diagnosis not present

## 2020-10-02 DIAGNOSIS — W57XXXA Bitten or stung by nonvenomous insect and other nonvenomous arthropods, initial encounter: Secondary | ICD-10-CM | POA: Diagnosis not present

## 2020-10-02 DIAGNOSIS — L089 Local infection of the skin and subcutaneous tissue, unspecified: Secondary | ICD-10-CM | POA: Diagnosis not present

## 2020-10-22 DIAGNOSIS — H18451 Nodular corneal degeneration, right eye: Secondary | ICD-10-CM | POA: Diagnosis not present

## 2020-10-22 DIAGNOSIS — H16223 Keratoconjunctivitis sicca, not specified as Sjogren's, bilateral: Secondary | ICD-10-CM | POA: Diagnosis not present

## 2020-10-22 DIAGNOSIS — Z0101 Encounter for examination of eyes and vision with abnormal findings: Secondary | ICD-10-CM | POA: Diagnosis not present

## 2020-10-30 DIAGNOSIS — H16223 Keratoconjunctivitis sicca, not specified as Sjogren's, bilateral: Secondary | ICD-10-CM | POA: Diagnosis not present

## 2020-10-30 DIAGNOSIS — H18451 Nodular corneal degeneration, right eye: Secondary | ICD-10-CM | POA: Diagnosis not present

## 2020-11-11 ENCOUNTER — Other Ambulatory Visit: Payer: Self-pay | Admitting: Internal Medicine

## 2020-11-11 DIAGNOSIS — Z1231 Encounter for screening mammogram for malignant neoplasm of breast: Secondary | ICD-10-CM

## 2020-11-14 ENCOUNTER — Other Ambulatory Visit: Payer: Self-pay

## 2020-11-14 ENCOUNTER — Ambulatory Visit
Admission: RE | Admit: 2020-11-14 | Discharge: 2020-11-14 | Disposition: A | Discharge status: Home / Self Care | Payer: Medicare PPO | Source: Ambulatory Visit | Attending: Internal Medicine | Admitting: Internal Medicine

## 2020-11-14 DIAGNOSIS — Z1231 Encounter for screening mammogram for malignant neoplasm of breast: Secondary | ICD-10-CM

## 2020-11-21 DIAGNOSIS — E039 Hypothyroidism, unspecified: Secondary | ICD-10-CM | POA: Diagnosis not present

## 2020-12-25 DIAGNOSIS — R21 Rash and other nonspecific skin eruption: Secondary | ICD-10-CM | POA: Diagnosis not present

## 2020-12-25 DIAGNOSIS — T50905A Adverse effect of unspecified drugs, medicaments and biological substances, initial encounter: Secondary | ICD-10-CM | POA: Diagnosis not present

## 2020-12-25 DIAGNOSIS — U071 COVID-19: Secondary | ICD-10-CM | POA: Diagnosis not present

## 2020-12-25 DIAGNOSIS — T375X5A Adverse effect of antiviral drugs, initial encounter: Secondary | ICD-10-CM | POA: Diagnosis not present

## 2021-03-16 DIAGNOSIS — Z809 Family history of malignant neoplasm, unspecified: Secondary | ICD-10-CM | POA: Diagnosis not present

## 2021-03-16 DIAGNOSIS — E785 Hyperlipidemia, unspecified: Secondary | ICD-10-CM | POA: Diagnosis not present

## 2021-03-16 DIAGNOSIS — K219 Gastro-esophageal reflux disease without esophagitis: Secondary | ICD-10-CM | POA: Diagnosis not present

## 2021-03-16 DIAGNOSIS — E039 Hypothyroidism, unspecified: Secondary | ICD-10-CM | POA: Diagnosis not present

## 2021-03-16 DIAGNOSIS — Z9181 History of falling: Secondary | ICD-10-CM | POA: Diagnosis not present

## 2021-03-16 DIAGNOSIS — M1990 Unspecified osteoarthritis, unspecified site: Secondary | ICD-10-CM | POA: Diagnosis not present

## 2021-03-16 DIAGNOSIS — Z820 Family history of epilepsy and other diseases of the nervous system: Secondary | ICD-10-CM | POA: Diagnosis not present

## 2021-03-16 DIAGNOSIS — I10 Essential (primary) hypertension: Secondary | ICD-10-CM | POA: Diagnosis not present

## 2021-05-09 DIAGNOSIS — H60392 Other infective otitis externa, left ear: Secondary | ICD-10-CM | POA: Diagnosis not present

## 2021-05-09 DIAGNOSIS — H66002 Acute suppurative otitis media without spontaneous rupture of ear drum, left ear: Secondary | ICD-10-CM | POA: Diagnosis not present

## 2021-05-09 DIAGNOSIS — H6120 Impacted cerumen, unspecified ear: Secondary | ICD-10-CM | POA: Diagnosis not present

## 2021-05-18 DIAGNOSIS — H9202 Otalgia, left ear: Secondary | ICD-10-CM | POA: Diagnosis not present

## 2021-06-04 DIAGNOSIS — I10 Essential (primary) hypertension: Secondary | ICD-10-CM | POA: Diagnosis not present

## 2021-06-04 DIAGNOSIS — M859 Disorder of bone density and structure, unspecified: Secondary | ICD-10-CM | POA: Diagnosis not present

## 2021-06-04 DIAGNOSIS — E039 Hypothyroidism, unspecified: Secondary | ICD-10-CM | POA: Diagnosis not present

## 2021-06-04 DIAGNOSIS — E7800 Pure hypercholesterolemia, unspecified: Secondary | ICD-10-CM | POA: Diagnosis not present

## 2021-06-11 DIAGNOSIS — Z1339 Encounter for screening examination for other mental health and behavioral disorders: Secondary | ICD-10-CM | POA: Diagnosis not present

## 2021-06-11 DIAGNOSIS — Z1331 Encounter for screening for depression: Secondary | ICD-10-CM | POA: Diagnosis not present

## 2021-06-11 DIAGNOSIS — E7800 Pure hypercholesterolemia, unspecified: Secondary | ICD-10-CM | POA: Diagnosis not present

## 2021-06-11 DIAGNOSIS — L821 Other seborrheic keratosis: Secondary | ICD-10-CM | POA: Diagnosis not present

## 2021-06-11 DIAGNOSIS — K219 Gastro-esophageal reflux disease without esophagitis: Secondary | ICD-10-CM | POA: Diagnosis not present

## 2021-06-11 DIAGNOSIS — M8580 Other specified disorders of bone density and structure, unspecified site: Secondary | ICD-10-CM | POA: Diagnosis not present

## 2021-06-11 DIAGNOSIS — E039 Hypothyroidism, unspecified: Secondary | ICD-10-CM | POA: Diagnosis not present

## 2021-06-11 DIAGNOSIS — R82998 Other abnormal findings in urine: Secondary | ICD-10-CM | POA: Diagnosis not present

## 2021-06-11 DIAGNOSIS — M1990 Unspecified osteoarthritis, unspecified site: Secondary | ICD-10-CM | POA: Diagnosis not present

## 2021-06-11 DIAGNOSIS — L570 Actinic keratosis: Secondary | ICD-10-CM | POA: Diagnosis not present

## 2021-06-11 DIAGNOSIS — Z23 Encounter for immunization: Secondary | ICD-10-CM | POA: Diagnosis not present

## 2021-06-11 DIAGNOSIS — H538 Other visual disturbances: Secondary | ICD-10-CM | POA: Diagnosis not present

## 2021-06-11 DIAGNOSIS — I10 Essential (primary) hypertension: Secondary | ICD-10-CM | POA: Diagnosis not present

## 2021-06-11 DIAGNOSIS — Z0000 Encounter for general adult medical examination without abnormal findings: Secondary | ICD-10-CM | POA: Diagnosis not present

## 2021-06-26 DIAGNOSIS — M79641 Pain in right hand: Secondary | ICD-10-CM | POA: Diagnosis not present

## 2021-07-02 DIAGNOSIS — M79641 Pain in right hand: Secondary | ICD-10-CM | POA: Diagnosis not present

## 2021-07-16 DIAGNOSIS — I10 Essential (primary) hypertension: Secondary | ICD-10-CM | POA: Diagnosis not present

## 2021-07-16 DIAGNOSIS — R42 Dizziness and giddiness: Secondary | ICD-10-CM | POA: Diagnosis not present

## 2021-07-16 DIAGNOSIS — E039 Hypothyroidism, unspecified: Secondary | ICD-10-CM | POA: Diagnosis not present

## 2021-07-16 DIAGNOSIS — R0789 Other chest pain: Secondary | ICD-10-CM | POA: Diagnosis not present

## 2021-07-16 DIAGNOSIS — K219 Gastro-esophageal reflux disease without esophagitis: Secondary | ICD-10-CM | POA: Diagnosis not present

## 2021-07-16 DIAGNOSIS — M1990 Unspecified osteoarthritis, unspecified site: Secondary | ICD-10-CM | POA: Diagnosis not present

## 2021-08-19 DIAGNOSIS — H5213 Myopia, bilateral: Secondary | ICD-10-CM | POA: Diagnosis not present

## 2021-08-19 DIAGNOSIS — H524 Presbyopia: Secondary | ICD-10-CM | POA: Diagnosis not present

## 2021-08-19 DIAGNOSIS — H52223 Regular astigmatism, bilateral: Secondary | ICD-10-CM | POA: Diagnosis not present

## 2021-09-18 DIAGNOSIS — M5459 Other low back pain: Secondary | ICD-10-CM | POA: Diagnosis not present

## 2021-10-13 ENCOUNTER — Other Ambulatory Visit: Payer: Self-pay | Admitting: Internal Medicine

## 2021-10-13 DIAGNOSIS — Z1231 Encounter for screening mammogram for malignant neoplasm of breast: Secondary | ICD-10-CM

## 2021-11-02 DIAGNOSIS — L237 Allergic contact dermatitis due to plants, except food: Secondary | ICD-10-CM | POA: Diagnosis not present

## 2021-11-10 DIAGNOSIS — R0789 Other chest pain: Secondary | ICD-10-CM | POA: Diagnosis not present

## 2021-11-10 DIAGNOSIS — K219 Gastro-esophageal reflux disease without esophagitis: Secondary | ICD-10-CM | POA: Diagnosis not present

## 2021-11-10 DIAGNOSIS — F4323 Adjustment disorder with mixed anxiety and depressed mood: Secondary | ICD-10-CM | POA: Diagnosis not present

## 2021-11-17 ENCOUNTER — Other Ambulatory Visit: Payer: Self-pay | Admitting: Gastroenterology

## 2021-11-17 DIAGNOSIS — R112 Nausea with vomiting, unspecified: Secondary | ICD-10-CM

## 2021-11-17 DIAGNOSIS — K219 Gastro-esophageal reflux disease without esophagitis: Secondary | ICD-10-CM | POA: Diagnosis not present

## 2021-11-17 DIAGNOSIS — R1013 Epigastric pain: Secondary | ICD-10-CM | POA: Diagnosis not present

## 2021-11-19 ENCOUNTER — Ambulatory Visit
Admission: RE | Admit: 2021-11-19 | Discharge: 2021-11-19 | Disposition: A | Discharge status: Home / Self Care | Payer: Medicare PPO | Source: Ambulatory Visit | Attending: Internal Medicine | Admitting: Internal Medicine

## 2021-11-19 DIAGNOSIS — Z1231 Encounter for screening mammogram for malignant neoplasm of breast: Secondary | ICD-10-CM | POA: Diagnosis not present

## 2021-11-25 ENCOUNTER — Ambulatory Visit
Admission: RE | Admit: 2021-11-25 | Discharge: 2021-11-25 | Disposition: A | Discharge status: Home / Self Care | Payer: Medicare PPO | Source: Ambulatory Visit | Attending: Gastroenterology | Admitting: Gastroenterology

## 2021-11-25 DIAGNOSIS — K449 Diaphragmatic hernia without obstruction or gangrene: Secondary | ICD-10-CM | POA: Diagnosis not present

## 2021-11-25 DIAGNOSIS — R112 Nausea with vomiting, unspecified: Secondary | ICD-10-CM

## 2021-11-25 DIAGNOSIS — R1013 Epigastric pain: Secondary | ICD-10-CM

## 2021-11-25 DIAGNOSIS — K219 Gastro-esophageal reflux disease without esophagitis: Secondary | ICD-10-CM | POA: Diagnosis not present

## 2021-11-25 DIAGNOSIS — K224 Dyskinesia of esophagus: Secondary | ICD-10-CM | POA: Diagnosis not present

## 2021-12-04 DIAGNOSIS — R933 Abnormal findings on diagnostic imaging of other parts of digestive tract: Secondary | ICD-10-CM | POA: Diagnosis not present

## 2021-12-04 DIAGNOSIS — K219 Gastro-esophageal reflux disease without esophagitis: Secondary | ICD-10-CM | POA: Diagnosis not present

## 2021-12-11 DIAGNOSIS — R933 Abnormal findings on diagnostic imaging of other parts of digestive tract: Secondary | ICD-10-CM | POA: Diagnosis not present

## 2021-12-11 DIAGNOSIS — K317 Polyp of stomach and duodenum: Secondary | ICD-10-CM | POA: Diagnosis not present

## 2021-12-16 DIAGNOSIS — K317 Polyp of stomach and duodenum: Secondary | ICD-10-CM | POA: Diagnosis not present

## 2021-12-22 DIAGNOSIS — E785 Hyperlipidemia, unspecified: Secondary | ICD-10-CM | POA: Diagnosis not present

## 2021-12-22 DIAGNOSIS — Z91040 Latex allergy status: Secondary | ICD-10-CM | POA: Diagnosis not present

## 2021-12-22 DIAGNOSIS — I951 Orthostatic hypotension: Secondary | ICD-10-CM | POA: Diagnosis not present

## 2021-12-22 DIAGNOSIS — K259 Gastric ulcer, unspecified as acute or chronic, without hemorrhage or perforation: Secondary | ICD-10-CM | POA: Diagnosis not present

## 2021-12-22 DIAGNOSIS — I10 Essential (primary) hypertension: Secondary | ICD-10-CM | POA: Diagnosis not present

## 2021-12-22 DIAGNOSIS — E039 Hypothyroidism, unspecified: Secondary | ICD-10-CM | POA: Diagnosis not present

## 2021-12-22 DIAGNOSIS — K219 Gastro-esophageal reflux disease without esophagitis: Secondary | ICD-10-CM | POA: Diagnosis not present

## 2021-12-22 DIAGNOSIS — J309 Allergic rhinitis, unspecified: Secondary | ICD-10-CM | POA: Diagnosis not present

## 2021-12-22 DIAGNOSIS — Z809 Family history of malignant neoplasm, unspecified: Secondary | ICD-10-CM | POA: Diagnosis not present

## 2022-03-22 DIAGNOSIS — K219 Gastro-esophageal reflux disease without esophagitis: Secondary | ICD-10-CM | POA: Diagnosis not present

## 2022-03-22 DIAGNOSIS — R1013 Epigastric pain: Secondary | ICD-10-CM | POA: Diagnosis not present

## 2022-04-07 ENCOUNTER — Other Ambulatory Visit: Payer: Self-pay

## 2022-04-07 ENCOUNTER — Observation Stay (HOSPITAL_BASED_OUTPATIENT_CLINIC_OR_DEPARTMENT_OTHER)
Admission: EM | Admit: 2022-04-07 | Discharge: 2022-04-08 | Disposition: A | Discharge status: Home / Self Care | Payer: Medicare PPO | Attending: Internal Medicine | Admitting: Internal Medicine

## 2022-04-07 ENCOUNTER — Emergency Department (HOSPITAL_BASED_OUTPATIENT_CLINIC_OR_DEPARTMENT_OTHER): Payer: Medicare PPO

## 2022-04-07 ENCOUNTER — Encounter (HOSPITAL_COMMUNITY): Payer: Self-pay

## 2022-04-07 ENCOUNTER — Encounter (HOSPITAL_BASED_OUTPATIENT_CLINIC_OR_DEPARTMENT_OTHER): Payer: Self-pay | Admitting: Emergency Medicine

## 2022-04-07 DIAGNOSIS — E039 Hypothyroidism, unspecified: Secondary | ICD-10-CM | POA: Diagnosis not present

## 2022-04-07 DIAGNOSIS — I10 Essential (primary) hypertension: Secondary | ICD-10-CM | POA: Diagnosis not present

## 2022-04-07 DIAGNOSIS — R29818 Other symptoms and signs involving the nervous system: Secondary | ICD-10-CM | POA: Diagnosis not present

## 2022-04-07 DIAGNOSIS — E785 Hyperlipidemia, unspecified: Secondary | ICD-10-CM | POA: Insufficient documentation

## 2022-04-07 DIAGNOSIS — G459 Transient cerebral ischemic attack, unspecified: Secondary | ICD-10-CM | POA: Diagnosis not present

## 2022-04-07 DIAGNOSIS — R200 Anesthesia of skin: Secondary | ICD-10-CM | POA: Diagnosis present

## 2022-04-07 DIAGNOSIS — M545 Low back pain, unspecified: Secondary | ICD-10-CM | POA: Diagnosis not present

## 2022-04-07 DIAGNOSIS — R079 Chest pain, unspecified: Secondary | ICD-10-CM | POA: Insufficient documentation

## 2022-04-07 DIAGNOSIS — Z79899 Other long term (current) drug therapy: Secondary | ICD-10-CM | POA: Insufficient documentation

## 2022-04-07 DIAGNOSIS — Z91040 Latex allergy status: Secondary | ICD-10-CM | POA: Diagnosis not present

## 2022-04-07 DIAGNOSIS — M25512 Pain in left shoulder: Secondary | ICD-10-CM | POA: Diagnosis not present

## 2022-04-07 DIAGNOSIS — R001 Bradycardia, unspecified: Secondary | ICD-10-CM | POA: Diagnosis not present

## 2022-04-07 DIAGNOSIS — K219 Gastro-esophageal reflux disease without esophagitis: Secondary | ICD-10-CM | POA: Insufficient documentation

## 2022-04-07 DIAGNOSIS — Z96691 Finger-joint replacement of right hand: Secondary | ICD-10-CM | POA: Diagnosis not present

## 2022-04-07 DIAGNOSIS — M542 Cervicalgia: Secondary | ICD-10-CM | POA: Diagnosis not present

## 2022-04-07 LAB — COMPREHENSIVE METABOLIC PANEL
ALT: 14 U/L (ref 0–44)
AST: 20 U/L (ref 15–41)
Albumin: 4.4 g/dL (ref 3.5–5.0)
Alkaline Phosphatase: 47 U/L (ref 38–126)
Anion gap: 9 (ref 5–15)
BUN: 20 mg/dL (ref 8–23)
CO2: 25 mmol/L (ref 22–32)
Calcium: 9.7 mg/dL (ref 8.9–10.3)
Chloride: 105 mmol/L (ref 98–111)
Creatinine, Ser: 0.93 mg/dL (ref 0.44–1.00)
GFR, Estimated: >60 mL/min (ref >60)
Glucose, Bld: 107 mg/dL — ABNORMAL HIGH (ref 70–99)
Potassium: 4 mmol/L (ref 3.5–5.1)
Sodium: 139 mmol/L (ref 135–145)
Total Bilirubin: 0.4 mg/dL (ref 0.3–1.2)
Total Protein: 7.3 g/dL (ref 6.5–8.1)

## 2022-04-07 LAB — CBC
HCT: 40.2 % (ref 36.0–46.0)
Hemoglobin: 13.4 g/dL (ref 12.0–15.0)
MCH: 32.4 pg (ref 26.0–34.0)
MCHC: 33.3 g/dL (ref 30.0–36.0)
MCV: 97.1 fL (ref 80.0–100.0)
Platelets: 255 10*3/uL (ref 150–400)
RBC: 4.14 MIL/uL (ref 3.87–5.11)
RDW: 12.7 % (ref 11.5–15.5)
WBC: 6.5 10*3/uL (ref 4.0–10.5)
nRBC: 0 % (ref 0.0–0.2)

## 2022-04-07 MED ORDER — ASPIRIN 81 MG PO CHEW
324.0000 mg | CHEWABLE_TABLET | Freq: Every day | ORAL | Status: DC
  Administered 2022-04-08: 324 mg via ORAL
  Filled 2022-04-07: qty 4

## 2022-04-07 NOTE — ED Triage Notes (Signed)
Pt arrived POV. Pt caox4 and ambulatory. Pt reports she slipped on leaves on Monday causing her to fall. Pt denies LOC but does not remember if she hit her head. Denies taking blood thinner medication. Pt reports at 2000 last night she began experiencing tingling/numbness in her left finger tips which radiated up her arm lasting for 1.5 hours. Pt reports shoulder pain and neck tightness as well. Pt states the tingling subsided but went to UC today who recommended she go to ED for neuro rule out. NIH 1 on arrival to ED for decreased sensation in L arm. VAN negative.

## 2022-04-07 NOTE — ED Provider Notes (Addendum)
Patient seen and conjunction with PA Roehmhildt.  Patient presented to the ED for possible stroke TIA evaluation.  Patient states she fell after slipping some leaves on Monday.  She did not recall any significant head or neck injury.  Patient states however yesterday evening at about 8 PM she started experiencing tingling and numbness in her left hands and fingers.  She also felt it in her face.  Patient felt like it was hard to grip with her left hand.  She did have some pain in the back of her head and some tightness in her neck.  Patient went to an orthopedic urgent care because she does have a history of prior spinal surgery.  Patient states she was evaluated there and they did not feel that her symptoms or definitely consistent with a radiculopathy.  They were concerned about stroke TIA and recommended she come to the ED for further evaluation. Physical Exam  BP (!) 169/67   Pulse (!) 56   Temp 97.7 F (36.5 C) (Oral)   Resp 13   Ht 1.575 m (5\' 2" )   Wt 63 kg   SpO2 98%   BMI 25.42 kg/m   Physical Exam Vitals and nursing note reviewed.  Constitutional:      General: She is not in acute distress.    Appearance: She is well-developed.  HENT:     Head: Normocephalic and atraumatic.     Right Ear: External ear normal.     Left Ear: External ear normal.  Eyes:     General: No visual field deficit or scleral icterus.       Right eye: No discharge.        Left eye: No discharge.     Conjunctiva/sclera: Conjunctivae normal.  Neck:     Trachea: No tracheal deviation.  Cardiovascular:     Rate and Rhythm: Normal rate.  Pulmonary:     Effort: Pulmonary effort is normal. No respiratory distress.     Breath sounds: No stridor.  Abdominal:     General: There is no distension.  Musculoskeletal:        General: No swelling, tenderness, deformity or signs of injury.     Cervical back: Neck supple.  Skin:    General: Skin is warm and dry.     Findings: No rash.  Neurological:     Mental  Status: She is alert.     Cranial Nerves: No cranial nerve deficit (no gross deficits), dysarthria or facial asymmetry.     Sensory: Sensation is intact.     Motor: No weakness, tremor, atrophy, abnormal muscle tone or seizure activity.     Procedures  Procedures  ED Course / MDM   Clinical Course as of 04/07/22 2346  Wed Apr 07, 2022  2310 Case was discussed with Dr. Blair Dolphin.  Could consider MRI outpatient work-up if symptoms were suggestive of radiculopathy.  However, considering her age and risk factors with moderate ABCD2 score if stroke TIA is a concern would recommend admission for TIA work-up. [id number] Labs reviewed.  No significant abnormalities. [JK]  2346 D/w Dr Sidney Ace regarding admission [JK]    Clinical Course User Index [JK] Dorie Rank, MD   Medical Decision Making Problems Addressed: TIA (transient ischemic attack): acute illness or injury that poses a threat to life or bodily functions  Amount and/or Complexity of Data Reviewed Labs: ordered. Decision-making details documented in ED Course. Radiology: ordered. Discussion of management or test interpretation with external provider(s): Case  discussed with Dr. Lenora Boys, neurology  Risk Decision regarding hospitalization.   Patient presented for evaluation of possible stroke stroke TIA symptoms.  Cervical radiculopathy was a consideration however patient reports to me numbness in her hand as well as her face.  She also did have an orthopedic evaluation did not feel that her symptoms were typical for cervical radiculopathy.  Considering her age and risk factors and ABCD2 score I will consult with the medical service for admission and further work-up.  She is currently at the freestanding ED and will require transfer.       Linwood Dibbles, MD 04/07/22 Janetta Hora    Linwood Dibbles, MD 04/07/22 [phone]

## 2022-04-08 ENCOUNTER — Observation Stay (HOSPITAL_COMMUNITY): Payer: Medicare PPO

## 2022-04-08 ENCOUNTER — Observation Stay (HOSPITAL_BASED_OUTPATIENT_CLINIC_OR_DEPARTMENT_OTHER): Payer: Medicare PPO

## 2022-04-08 DIAGNOSIS — R202 Paresthesia of skin: Secondary | ICD-10-CM | POA: Diagnosis not present

## 2022-04-08 DIAGNOSIS — R001 Bradycardia, unspecified: Secondary | ICD-10-CM | POA: Insufficient documentation

## 2022-04-08 DIAGNOSIS — E039 Hypothyroidism, unspecified: Secondary | ICD-10-CM | POA: Insufficient documentation

## 2022-04-08 DIAGNOSIS — Z96691 Finger-joint replacement of right hand: Secondary | ICD-10-CM | POA: Diagnosis not present

## 2022-04-08 DIAGNOSIS — R200 Anesthesia of skin: Secondary | ICD-10-CM | POA: Diagnosis not present

## 2022-04-08 DIAGNOSIS — G459 Transient cerebral ischemic attack, unspecified: Secondary | ICD-10-CM

## 2022-04-08 DIAGNOSIS — R519 Headache, unspecified: Secondary | ICD-10-CM | POA: Diagnosis not present

## 2022-04-08 DIAGNOSIS — Z91040 Latex allergy status: Secondary | ICD-10-CM | POA: Diagnosis not present

## 2022-04-08 DIAGNOSIS — R569 Unspecified convulsions: Secondary | ICD-10-CM

## 2022-04-08 DIAGNOSIS — Z8673 Personal history of transient ischemic attack (TIA), and cerebral infarction without residual deficits: Secondary | ICD-10-CM | POA: Diagnosis not present

## 2022-04-08 DIAGNOSIS — M542 Cervicalgia: Secondary | ICD-10-CM | POA: Diagnosis not present

## 2022-04-08 DIAGNOSIS — R079 Chest pain, unspecified: Secondary | ICD-10-CM | POA: Diagnosis not present

## 2022-04-08 DIAGNOSIS — K219 Gastro-esophageal reflux disease without esophagitis: Secondary | ICD-10-CM | POA: Insufficient documentation

## 2022-04-08 DIAGNOSIS — Z79899 Other long term (current) drug therapy: Secondary | ICD-10-CM | POA: Diagnosis not present

## 2022-04-08 DIAGNOSIS — I10 Essential (primary) hypertension: Secondary | ICD-10-CM | POA: Diagnosis not present

## 2022-04-08 DIAGNOSIS — E785 Hyperlipidemia, unspecified: Secondary | ICD-10-CM | POA: Insufficient documentation

## 2022-04-08 LAB — LIPID PANEL
Cholesterol: 203 mg/dL — ABNORMAL HIGH (ref 0–200)
HDL: 44 mg/dL (ref >40)
LDL Cholesterol: 138 mg/dL — ABNORMAL HIGH (ref 0–99)
Total CHOL/HDL Ratio: 4.6 RATIO
Triglycerides: 104 mg/dL (ref <150)
VLDL: 21 mg/dL (ref 0–40)

## 2022-04-08 LAB — TROPONIN I (HIGH SENSITIVITY): Troponin I (High Sensitivity): 5 ng/L (ref <18)

## 2022-04-08 LAB — HEMOGLOBIN A1C
Hgb A1c MFr Bld: 5 % (ref 4.8–5.6)
Mean Plasma Glucose: 96.8 mg/dL

## 2022-04-08 LAB — ECHOCARDIOGRAM COMPLETE
Area-P 1/2: 3.3 cm2
Height: 62 in
P 1/2 time: 709 msec
S' Lateral: 2.5 cm
Weight: 2224 oz

## 2022-04-08 LAB — TSH: TSH: 4.968 u[IU]/mL — ABNORMAL HIGH (ref 0.350–4.500)

## 2022-04-08 LAB — T4, FREE: Free T4: 1.14 ng/dL — ABNORMAL HIGH (ref 0.61–1.12)

## 2022-04-08 MED ORDER — ACETAMINOPHEN 160 MG/5ML PO SOLN: 650.0000 mg | Freq: Four times a day (QID) | ORAL | Status: DC | PRN

## 2022-04-08 MED ORDER — ASPIRIN 81 MG PO TBEC: 81.0000 mg | DELAYED_RELEASE_TABLET | Freq: Every day | ORAL | 11 refills | Status: AC

## 2022-04-08 MED ORDER — STROKE: EARLY STAGES OF RECOVERY BOOK: Freq: Once | Status: DC

## 2022-04-08 MED ORDER — LORAZEPAM 2 MG/ML IJ SOLN
0.5000 mg | Freq: Once | INTRAMUSCULAR | Status: AC | PRN
  Administered 2022-04-08: 0.5 mg via INTRAVENOUS
  Filled 2022-04-08: qty 1

## 2022-04-08 MED ORDER — ATORVASTATIN CALCIUM 10 MG PO TABS
20.0000 mg | ORAL_TABLET | Freq: Every day | ORAL | Status: DC
  Administered 2022-04-08: 20 mg via ORAL
  Filled 2022-04-08: qty 2

## 2022-04-08 MED ORDER — ATORVASTATIN CALCIUM 40 MG PO TABS: 40.0000 mg | ORAL_TABLET | Freq: Every day | ORAL | 2 refills | Status: DC

## 2022-04-08 MED ORDER — SODIUM CHLORIDE 0.9 % IV SOLN
20.0000 mg/kg | Freq: Once | INTRAVENOUS | Status: DC
  Filled 2022-04-08: qty 12.6

## 2022-04-08 MED ORDER — IOHEXOL 350 MG/ML SOLN
75.0000 mL | Freq: Once | INTRAVENOUS | Status: AC | PRN
  Administered 2022-04-08: 75 mL via INTRAVENOUS

## 2022-04-08 MED ORDER — CLOPIDOGREL BISULFATE 75 MG PO TABS: 75.0000 mg | ORAL_TABLET | Freq: Every day | ORAL | 0 refills | Status: AC

## 2022-04-08 MED ORDER — ACETAMINOPHEN 325 MG PO TABS
650.0000 mg | ORAL_TABLET | Freq: Four times a day (QID) | ORAL | Status: DC | PRN
  Administered 2022-04-08: 650 mg via ORAL
  Filled 2022-04-08: qty 2

## 2022-04-08 MED ORDER — ACETAMINOPHEN 650 MG RE SUPP: 650.0000 mg | Freq: Four times a day (QID) | RECTAL | Status: DC | PRN

## 2022-04-08 NOTE — Progress Notes (Signed)
EEG complete - results pending 

## 2022-04-08 NOTE — Consult Note (Signed)
NEURO HOSPITALIST CONSULT NOTE   Requestig physician: Dr. Neoma LamingKrishan   Reason for Consult: Acute onset numbness and tingling involving left upper extremity and face    History obtained from: Patient and Chart  HPI:                                                                                                                                          Tiffany Mayo is an 81 y.o. female  with PMHx of HTN, HLD, hypothyroidism, and GERD presenting with 3 hour episode of left fingertip denies any numbness and tingling the radiated and progressed to left arm and neck, secondary to recent fall 2 days ago.  On evaluation today she is asymptomatic, denies any pain or tingling or numbness of her extremities.  Denies any recurrence.  Denies any history of stroke or seizures.  States that she was seen by an orthopedic surgeon prior to coming to the ED, she had a x-ray of her spine done at that time which was negative for any acute findings or abnormalities.  She reports a prior surgery of her anterior cervical decomp/discectomy fusion in 2013, with no other complications.  Past Medical History:  Diagnosis Date   Arthritis    spine, neck   GERD (gastroesophageal reflux disease)    Headache    SINUS    Hemorrhoid    Hyperlipemia    Hypertension    Hypothyroidism     Past Surgical History:  Procedure Laterality Date   ABDOMINAL HYSTERECTOMY  1990   ANTERIOR CERVICAL DECOMP/DISCECTOMY FUSION  04/03/2012   Procedure: ANTERIOR CERVICAL DECOMPRESSION/DISCECTOMY FUSION 1 LEVEL;  Surgeon: Hewitt Shortsobert W Nudelman, MD;  Location: [facility];  Service: Neurosurgery;  Laterality: N/A;  Cervical Five-Six Anterior Cervical Decompression with Fusion Plating and Bonegraft   BUNIONECTOMY     RIGHT FOOT   EYE SURGERY     Lasik, Catarct   GANGLION CYST EXCISION     BIL WRIST    JOINT REPLACEMENT     Right middle finger   TRIGGER FINGER RELEASE     thumbs    History reviewed. No pertinent  family history.            Social History:  reports that she has never smoked. She has never used smokeless tobacco. She reports current alcohol use. She reports that she does not use drugs.  Allergies  Allergen Reactions   Latex Rash   Codeine Nausea And Vomiting   Sulfa Antibiotics Nausea And Vomiting    MEDICATIONS:  I have reviewed the patient's current medications. Prior to Admission:  Medications Prior to Admission  Medication Sig Dispense Refill Last Dose   levothyroxine (SYNTHROID) 75 MCG tablet Take 75 mcg by mouth daily before breakfast.      olmesartan (BENICAR) 20 MG tablet Take 20 mg by mouth daily.      rosuvastatin (CRESTOR) 5 MG tablet Take 5 mg by mouth See admin instructions. 5 mg once a week on Thursday.      sucralfate (CARAFATE) 1 g tablet Take 1 g by mouth 2 (two) times daily.      triamcinolone cream (KENALOG) 0.1 % Apply 1 Application topically daily as needed (irritation).      Cholecalciferol (VITAMIN D3) 1000 units CAPS Take 1,000 Units by mouth daily.       esomeprazole (NEXIUM) 20 MG capsule Take 20 mg by mouth daily.      ibuprofen (ADVIL,MOTRIN) 200 MG tablet Take 200 mg by mouth every 6 (six) hours as needed for headache.      levothyroxine (SYNTHROID) 50 MCG tablet Take 50 mcg by mouth every other day.      Scheduled:  [START ON 04/09/2022]  stroke: early stages of recovery book   Does not apply Once     ROS:                                                                                                                                       History obtained from chart review and the patient  General ROS: negative for - chills, fatigue, fever, night sweats, weight gain or weight loss Psychological ROS: negative for - behavioral disorder, hallucinations, memory difficulties, mood swings or suicidal ideation Ophthalmic ROS: negative for -  blurry vision, double vision, eye pain or loss of vision ENT ROS: negative for - epistaxis, nasal discharge, oral lesions, sore throat, tinnitus or vertigo Allergy and Immunology ROS: negative for - hives or itchy/watery eyes Hematological and Lymphatic ROS: negative for - bleeding problems, bruising or swollen lymph nodes Endocrine ROS: negative for - galactorrhea, hair pattern changes, polydipsia/polyuria or temperature intolerance Respiratory ROS: negative for - cough, hemoptysis, shortness of breath or wheezing Cardiovascular ROS: negative for - chest pain, dyspnea on exertion, edema or irregular heartbeat Gastrointestinal ROS: negative for - abdominal pain, diarrhea, hematemesis, nausea/vomiting or stool incontinence Genito-Urinary ROS: negative for - dysuria, hematuria, incontinence or urinary frequency/urgency Musculoskeletal ROS: negative for - joint swelling or muscular weakness Neurological ROS: as noted in HPI Dermatological ROS: negative for rash and skin lesion changes   Blood pressure (!) 148/76, pulse (!) 59, temperature 98.2 F (36.8 C), temperature source Oral, resp. rate 17, height 5\' 2"  (1.575 m), weight 63 kg, SpO2 96 %.   General Examination:  Physical Exam  General:  HEENT-  Normocephalic, no lesions, without obvious abnormality.  Normal external eye and conjunctiva.   Cardiovascular- S1-S2 audible, pulses palpable throughout   Lungs-no rhonchi or wheezing noted, no excessive working breathing.  Saturations within normal limits Abdomen- All 4 quadrants palpated and nontender Extremities- Warm, dry and intact Musculoskeletal-no joint tenderness, deformity or swelling Skin-warm and dry, no hyperpigmentation, vitiligo, or suspicious lesions  Neurological Examination Mental Status: Alert, oriented, thought content appropriate.  Speech fluent without evidence of aphasia.   Able to follow 3 step commands without difficulty. Cranial Nerves: II: Discs flat bilaterally; Visual fields grossly normal,  III,IV, VI: ptosis not present, extra-ocular motions intact bilaterally pupils equal, round, reactive to light and accommodation V,VII: smile symmetric, facial light touch sensation normal bilaterally VIII: hearing normal bilaterally IX,X: uvula rises symmetrically XI: bilateral shoulder shrug XII: midline tongue extension Motor: UE Strength  Right  Left Deltoids  5/5  5/5 Triceps   5/5  5/5 Biceps   5/5  5/5 Wrist FL  5/5  5/5 Wrist Ext  5/5  5/5 Finger grip  5/5  5/5 LE Strength  Right  Left Hip Flex  5/5  5/5 Knee flexed  5/5  5/5 Knee X  5/5  5/5 Dorsiflexion  5/5  5/5 Plantarflexion  5/5  5/5  Tone and bulk:normal tone throughout; no atrophy noted Sensory: Pinprick and light touch intact throughout, bilaterally Deep Tendon Reflexes: 2+ and symmetric throughout Cerebellar: FNF: WNL Rapid alternating movements: WNL Heel-to-shin: WNL Gait: not assessed    Lab Results: Basic Metabolic Panel: Recent Labs  Lab 04/07/22 1921  NA 139  K 4.0  CL 105  CO2 25  GLUCOSE 107*  BUN 20  CREATININE 0.93  CALCIUM 9.7    CBC: Recent Labs  Lab 04/07/22 1921  WBC 6.5  HGB 13.4  HCT 40.2  MCV 97.1  PLT 255    Cardiac Enzymes: No results for input(s): "CKTOTAL", "CKMB", "CKMBINDEX", "TROPONINI" in the last 168 hours.  Lipid Panel: Recent Labs  Lab 04/08/22 0000  CHOL 203*  TRIG 104  HDL 44  CHOLHDL 4.6  VLDL 21  LDLCALC 235*    Imaging: MR CERVICAL SPINE WO CONTRAST  Result Date: 04/08/2022 CLINICAL DATA:  Neck pain, chronic degenerative changes. EXAM: MRI CERVICAL SPINE WITHOUT CONTRAST TECHNIQUE: Multiplanar, multisequence MR imaging of the cervical spine was performed. No intravenous contrast was administered. COMPARISON:  MRI of the cervical spine December 03, 2011. FINDINGS: Alignment: Straightening of the cervical curvature.  Vertebrae: No fracture, evidence of discitis, or bone lesion. Postsurgical changes from C5-6 ACDF. Cord: Normal signal and morphology. Posterior Fossa, vertebral arteries, paraspinal tissues: Negative. Disc levels: C2-3: No spinal canal or neural foraminal stenosis. C3-4: Prominent hypertrophic left facet degenerative changes. No significant spinal canal or neural foraminal stenosis. C4-5: Facet degenerative changes. No significant spinal canal or neural foraminal stenosis. C5-6: No spinal canal or neural foraminal stenosis. C6-7: Small posterior disc osteophyte complex and uncovertebral degenerative changes. No significant spinal canal or neural foraminal stenosis. C7-T1: Mild facet degenerative changes. No significant spinal canal or neural foraminal stenosis. IMPRESSION: Mild degenerative changes of the cervical spine without high-grade spinal canal or neural foraminal stenosis at any level. Electronically Signed   By: Baldemar Lenis M.D.   On: 04/08/2022 11:02   MR BRAIN WO CONTRAST  Result Date: 04/08/2022 CLINICAL DATA:  Acute onset left and/face numbness and tingling, acute neck and left shoulder pain EXAM: MRI HEAD WITHOUT CONTRAST TECHNIQUE:  Multiplanar, multiecho pulse sequences of the brain and surrounding structures were obtained without intravenous contrast. COMPARISON:  03/05/2016 MRI head, correlation is also made with 04/07/2022 CT head FINDINGS: Brain: No restricted diffusion to suggest acute or subacute infarct. No acute hemorrhage, mass, mass effect, midline shift. No hydrocephalus or extra-axial collection. Cerebral volume is within normal limits for age. Scattered T2 hyperintense signal in the periventricular white matter and pons, likely the sequela of mild-to-moderate chronic small vessel ischemic disease. Vascular: Normal arterial flow voids. Skull and upper cervical spine: Normal marrow signal. Sinuses/Orbits: Clear paranasal sinuses. Status post bilateral lens  replacements. Other: The mastoids are well aerated. IMPRESSION: No acute intracranial process. No evidence of acute or subacute infarct. Electronically Signed   By: Wiliam Ke M.D.   On: 04/08/2022 04:00   CT HEAD WO CONTRAST  Result Date: 04/07/2022 CLINICAL DATA:  Neurologic deficit. EXAM: CT HEAD WITHOUT CONTRAST TECHNIQUE: Contiguous axial images were obtained from the base of the skull through the vertex without intravenous contrast. RADIATION DOSE REDUCTION: This exam was performed according to the departmental dose-optimization program which includes automated exposure control, adjustment of the mA and/or kV according to patient size and/or use of iterative reconstruction technique. COMPARISON:  Head CT dated 03/05/2016. FINDINGS: Brain: The ventricles and sulci are appropriate size for the patient's age. The gray-white matter discrimination is preserved. There is no acute intracranial hemorrhage. No mass effect or midline shift. No extra-axial fluid collection. Vascular: No hyperdense vessel or unexpected calcification. Skull: Normal. Negative for fracture or focal lesion. Sinuses/Orbits: No acute finding. Other: None IMPRESSION: No acute intracranial pathology. Electronically Signed   By: Elgie Collard M.D.   On: 04/07/2022 19:06    Assessment and plan per attending neurologist   Assessment/impression: Tiffany Mayo is an 81 year old female with history of hypertension, hyperlipidemia, hypothyroidism and GERD who presents with episode of numbness that started in the left 3 fingers and then progressed up her hand or forearm, her upper arm and into her left face.  It took an hour for it to start progressing up and get to her face however the symptoms were persistent and not waxing and waning but about 3 hours into symptom onset, she went to sleep.  When she woke up in the morning, her symptoms were gone.  She had work-up with MRI brain without contrast and MR cervical spine without contrast  which were negative for an acute stroke or cord compression or foraminal stenosis.  She denies any prior history of strokes, no family history of strokes, reports her blood pressure is relatively well controlled at home and runs in the 150s systolic.  She is on Crestor and she takes her Crestor every day.  She denies any diagnosis of atrial fibrillation, she is not a smoker, does not use any recreational substances.  She also had a routine EEG which incidentally demonstrated left frontal sharp waves.  However, I would not expect the left frontal sharp wave to cause ipsilateral arm numbness.  Also the duration of her numbness is more concerning for a possible TIA rather than a seizure.  She denies any prior history of seizures, no family history of seizures or epilepsy.  She does not drink alcohol, no significant head injury or loss of consciousness.  No history of strokes, no ICH, no history of CNS surgery or meningitis.  Overall based on clinical presentation and symptoms, my suspicion is that this episode was likely a TIA.  I discussed this in detail with the  patient and obtain collateral from her son and her husband who are present at the bedside.  Plan:  - Frequent Neuro checks per stroke unit protocol - Recommend brain imaging with MRI Brain without contrast - Recommend Vascular imaging with CT angio head and neck - TTE is unremarkable. -LDL is elevated to 138, we will switch her from Crestor 5 mg daily to atorvastatin 40 mg daily.  I discussed side effects of atorvastatin including muscle soreness, muscle achiness, muscle cramps and potential hepatic dysfunction.  I also asked her to reach out to her PCP if she experiences any of these symptoms.  May consider switching her to PCSK9 inhibitors outpatient in case of side effects from Lipitor. -HbA1c is not concerning for diabetes. - Antithrombotic -aspirin 81 mg daily along with Plavix 75 mg daily for 21 days, followed by aspirin 81 mg daily alone -  Recommend DVT ppx - SBP goal -aim for gradual normotension - Recommend Telemetry monitoring for arrythmia - Recommend bedside swallow screen prior to PO intake. - Stroke education booklet - Recommend PT/OT/SLP consult - Follow-up with stroke clinic outpatient. - If she has seizures in the future, recommend low threshold to consider AEDs but not needed at this time.   Lorri Frederick, MD PGY-1 04/08/2022, 11:59 AM   NEUROHOSPITALIST ADDENDUM Performed a face to face diagnostic evaluation.   I have reviewed the contents of history and physical exam as documented by PA/ARNP/Resident and agree with above documentation.  I have discussed and formulated the above plan as documented. Edits to the note have been made as needed.  Erick Blinks, MD [facility] [phone]   If 7pm to 7am, please call on call as listed on AMION.

## 2022-04-08 NOTE — Procedures (Signed)
Patient Name: Tiffany Mayo  MRN: [id number]  Epilepsy Attending: Charlsie Quest  Referring Physician/Provider: Osvaldo Shipper, MD  Date: 04/06/2022 Duration: 27.13 mins  Patient history:  81yo F with left hand/face numbness and tingling. EEG to evaluate for seizure  Level of alertness: Awake, asleep  AEDs during EEG study: Ativan  Technical aspects: This EEG study was done with scalp electrodes positioned according to the 10-20 International system of electrode placement. Electrical activity was reviewed with band pass filter of 1-70Hz , sensitivity of 7 uV/mm, display speed of 94mm/sec with a 60Hz  notched filter applied as appropriate. EEG data were recorded continuously and digitally stored.  Video monitoring was available and reviewed as appropriate.  Description: The posterior dominant rhythm consists of 8 Hz activity of moderate voltage (25-35 uV) seen predominantly in posterior head regions, symmetric and reactive to eye opening and eye closing. Sleep was characterized by vertex waves, sleep spindles (12 to 14 Hz), maximal frontocentral region. Sharp waves were noted in left frontal region. Hyperventilation and photic stimulation were not performed.     ABNORMALITY -Sharp wave, left frontal region  IMPRESSION: This study showed evidence of potential epileptogenicity arising from LEFT frontal region. No seizures  were seen throughout the recording.  Dr. was notified.  [name] Derry Lory

## 2022-04-08 NOTE — Progress Notes (Signed)
[facility] PROGRESS NOTE   NJERI VICENTE VFI:[id number] DOB: January 31, 1941 DOA: 04/07/2022  PCP: Lewis Moccasin, MD  Brief History/Interval Summary: 81 y.o. female with medical history significant of hypertension, hyperlipidemia, hypothyroidism, GERD presented to the ED for evaluation of acute onset left hand/face numbness and tingling, acute neck and left shoulder pain in the setting of recent fall 2 days ago.  She was evaluated at orthopedic urgent care and they did not feel that her symptoms were consistent with radiculopathy.  She was sent to the ED due to concern for stroke.   Consultants: Neurology  Procedures:   Thoracic echocardiogram is pending EEG is pending   Subjective/Interval History: Patient denies any complaints this morning.  Her left-sided numbness and tingling has resolved.  She did not have any weakness.  Seems to be back to baseline.     Assessment/Plan:  Left paresthesia with facial numbness/concern for TIA Neurology consultation is pending. MRI brain did not show any acute infarct. Panel is pending.  HbA1c is 5.0. Echocardiogram is pending. EEG is pending.  PT and OT evaluation.  Chest pain/sinus bradycardia Not on any AV blocking agents.  No chest pain currently.  Troponin was normal.  EKG did not show any ischemic findings.  Check TSH.  Essential hypertension Allowing permissive hypertension.  Noted to be on olmesartan prior to admission.  Hypothyroidism Noted to be on levothyroxine 50 mcg every other day.  Hyperlipidemia Noted to be on Crestor.  Lipid panel is pending.  GERD PPI  DVT Prophylaxis: SCDs Code Status: DNR Family Communication: No family at bedside Disposition Plan: Lives with her husband.  Hopefully discharge home when work-up is completed and once cleared by neurology  Status is: Observation The patient remains OBS appropriate and will d/c before 2 midnights.      Medications: Scheduled:  [START ON  04/09/2022]  stroke: early stages of recovery book   Does not apply Once   Continuous: CZY:[id number] **OR** acetaminophen (TYLENOL) oral liquid 160 mg/5 mL **OR** acetaminophen  Antibiotics: Anti-infectives (From admission, onward)    None       Objective:  Vital Signs  Vitals:   04/08/22 0000 04/08/22 0125 04/08/22 0307 04/08/22 0750  BP: (!) 154/62 (!) 155/54 (!) 120/56 (!) 148/76  Pulse: (!) 55 (!) 52 (!) 51 (!) 59  Resp: 13 15 16 17   Temp:  97.9 F (36.6 C) 98.3 F (36.8 C) 98.2 F (36.8 C)  TempSrc:  Oral Oral Oral  SpO2: 97% 95% 97% 96%  Weight:      Height:       No intake or output data in the 24 hours ending 04/08/22 1113 Filed Weights   04/07/22 1811  Weight: 63 kg    General appearance: Awake alert.  In no distress Resp: Clear to auscultation bilaterally.  Normal effort Cardio: S1-S2 is normal regular.  No S3-S4.  No rubs murmurs or bruit GI: Abdomen is soft.  Nontender nondistended.  Bowel sounds are present normal.  No masses organomegaly Extremities: No edema.  Full range of motion of lower extremities. Neurologic: Alert and oriented x3.  No focal neurological deficits.    Lab Results:  Data Reviewed: I have personally reviewed following labs and reports of the imaging studies  CBC: Recent Labs  Lab 04/07/22 1921  WBC 6.5  HGB 13.4  HCT 40.2  MCV 97.1  PLT 255    Basic Metabolic Panel: Recent Labs  Lab 04/07/22 1921  NA 139  K 4.0  CL 105  CO2 25  GLUCOSE 107*  BUN 20  CREATININE 0.93  CALCIUM 9.7    GFR: Estimated Creatinine Clearance: 41.4 mL/min (by C-G formula based on SCr of 0.93 mg/dL).  Liver Function Tests: Recent Labs  Lab 04/07/22 1921  AST 20  ALT 14  ALKPHOS 47  BILITOT 0.4  PROT 7.3  ALBUMIN 4.4    HbA1C: Recent Labs    04/08/22 0552  HGBA1C 5.0     Radiology Studies: MR CERVICAL SPINE WO CONTRAST  Result Date: 04/08/2022 CLINICAL DATA:  Neck pain, chronic degenerative changes. EXAM: MRI  CERVICAL SPINE WITHOUT CONTRAST TECHNIQUE: Multiplanar, multisequence MR imaging of the cervical spine was performed. No intravenous contrast was administered. COMPARISON:  MRI of the cervical spine December 03, 2011. FINDINGS: Alignment: Straightening of the cervical curvature. Vertebrae: No fracture, evidence of discitis, or bone lesion. Postsurgical changes from C5-6 ACDF. Cord: Normal signal and morphology. Posterior Fossa, vertebral arteries, paraspinal tissues: Negative. Disc levels: C2-3: No spinal canal or neural foraminal stenosis. C3-4: Prominent hypertrophic left facet degenerative changes. No significant spinal canal or neural foraminal stenosis. C4-5: Facet degenerative changes. No significant spinal canal or neural foraminal stenosis. C5-6: No spinal canal or neural foraminal stenosis. C6-7: Small posterior disc osteophyte complex and uncovertebral degenerative changes. No significant spinal canal or neural foraminal stenosis. C7-T1: Mild facet degenerative changes. No significant spinal canal or neural foraminal stenosis. IMPRESSION: Mild degenerative changes of the cervical spine without high-grade spinal canal or neural foraminal stenosis at any level. Electronically Signed   By: Baldemar Lenis M.D.   On: 04/08/2022 11:02   MR BRAIN WO CONTRAST  Result Date: 04/08/2022 CLINICAL DATA:  Acute onset left and/face numbness and tingling, acute neck and left shoulder pain EXAM: MRI HEAD WITHOUT CONTRAST TECHNIQUE: Multiplanar, multiecho pulse sequences of the brain and surrounding structures were obtained without intravenous contrast. COMPARISON:  03/05/2016 MRI head, correlation is also made with 04/07/2022 CT head FINDINGS: Brain: No restricted diffusion to suggest acute or subacute infarct. No acute hemorrhage, mass, mass effect, midline shift. No hydrocephalus or extra-axial collection. Cerebral volume is within normal limits for age. Scattered T2 hyperintense signal in the  periventricular white matter and pons, likely the sequela of mild-to-moderate chronic small vessel ischemic disease. Vascular: Normal arterial flow voids. Skull and upper cervical spine: Normal marrow signal. Sinuses/Orbits: Clear paranasal sinuses. Status post bilateral lens replacements. Other: The mastoids are well aerated. IMPRESSION: No acute intracranial process. No evidence of acute or subacute infarct. Electronically Signed   By: Wiliam Ke M.D.   On: 04/08/2022 04:00   CT HEAD WO CONTRAST  Result Date: 04/07/2022 CLINICAL DATA:  Neurologic deficit. EXAM: CT HEAD WITHOUT CONTRAST TECHNIQUE: Contiguous axial images were obtained from the base of the skull through the vertex without intravenous contrast. RADIATION DOSE REDUCTION: This exam was performed according to the departmental dose-optimization program which includes automated exposure control, adjustment of the mA and/or kV according to patient size and/or use of iterative reconstruction technique. COMPARISON:  Head CT dated 03/05/2016. FINDINGS: Brain: The ventricles and sulci are appropriate size for the patient's age. The gray-white matter discrimination is preserved. There is no acute intracranial hemorrhage. No mass effect or midline shift. No extra-axial fluid collection. Vascular: No hyperdense vessel or unexpected calcification. Skull: Normal. Negative for fracture or focal lesion. Sinuses/Orbits: No acute finding. Other: None IMPRESSION: No acute intracranial pathology. Electronically Signed   By: Elgie Collard M.D.   On: 04/07/2022 19:06  LOS: 0 days   Mckinzee Spirito [employer] on [email or web address]  04/08/2022, 11:13 AM

## 2022-04-08 NOTE — Evaluation (Signed)
Physical Therapy Evaluation and Discharge Patient Details Name: Tiffany Mayo MRN: [id number] DOB: November 27, 1940 Today's Date: 04/08/2022  History of Present Illness  81 y.o. female presented to the ED 04/07/22 for evaluation of acute onset left hand/face numbness and tingling, acute neck and left shoulder pain in the setting of recent fall 2 days ago. CT head negative for acute finding.  PMH significant of hypertension, hyperlipidemia, hypothyroidism, GERD, ACDF  Clinical Impression   Patient evaluated by Physical Therapy with no further acute PT needs identified. Patient ambulated independently in hall, completed stairs without rails, and scored 24/24 on DGI. PT is signing off. Thank you for this referral.        Recommendations for follow up therapy are one component of a multi-disciplinary discharge planning process, led by the attending physician.  Recommendations may be updated based on patient status, additional functional criteria and insurance authorization.  Follow Up Recommendations No PT follow up      Assistance Recommended at Discharge None  Patient can return home with the following       Equipment Recommendations None recommended by PT  Recommendations for Other Services       Functional Status Assessment Patient has not had a recent decline in their functional status     Precautions / Restrictions Precautions Precautions: None      Mobility  Bed Mobility Overal bed mobility: Independent             General bed mobility comments: sitting on EOB on arrival; return to supine at end of session I'ly    Transfers Overall transfer level: Independent Equipment used: None                    Ambulation/Gait Ambulation/Gait assistance: Independent, Supervision Gait Distance (Feet): 400 Feet Assistive device: None Gait Pattern/deviations: WFL(Within Functional Limits)   Gait velocity interpretation: >2.62 ft/sec, indicative of community  ambulatory   General Gait Details: initial 50 ft with slight drift to her right which she noted and self-corrected  Stairs Stairs: Yes Stairs assistance: Independent Stair Management: No rails, Forwards, Alternating pattern Number of Stairs: 2 General stair comments: no issues  Wheelchair Mobility    Modified Rankin (Stroke Patients Only) Modified Rankin (Stroke Patients Only) Pre-Morbid Rankin Score: No symptoms Modified Rankin: No symptoms     Balance Overall balance assessment: Independent                               Standardized Balance Assessment Standardized Balance Assessment : Dynamic Gait Index   Dynamic Gait Index Level Surface: Normal Change in Gait Speed: Normal Gait with Horizontal Head Turns: Normal Gait with Vertical Head Turns: Normal Gait and Pivot Turn: Normal Step Over Obstacle: Normal Step Around Obstacles: Normal Steps: Normal Total Score: 24       Pertinent Vitals/Pain Pain Assessment Pain Assessment: No/denies pain    Home Living Family/patient expects to be discharged to:: Private residence Living Arrangements: Spouse/significant other Available Help at Discharge: Family;Available 24 hours/day Type of Home: House Home Access: Stairs to enter Entrance Stairs-Rails: None Entrance Stairs-Number of Steps: 3 Alternate Level Stairs-Number of Steps: flight Home Layout: Two level;Able to live on main level with bedroom/bathroom;Laundry or work area in [employer]: None      Prior Function Prior Level of Function : Independent/Modified Independent  Hand Dominance   Dominant Hand: Right    Extremity/Trunk Assessment   Upper Extremity Assessment Upper Extremity Assessment: Defer to OT evaluation    Lower Extremity Assessment Lower Extremity Assessment: Overall WFL for tasks assessed    Cervical / Trunk Assessment Cervical / Trunk Assessment: Normal  Communication    Communication: No difficulties  Cognition Arousal/Alertness: Awake/alert Behavior During Therapy: WFL for tasks assessed/performed Overall Cognitive Status: Within Functional Limits for tasks assessed                                          General Comments      Exercises     Assessment/Plan    PT Assessment Patient does not need any further PT services  PT Problem List         PT Treatment Interventions      PT Goals (Current goals can be found in the Care Plan section)  Acute Rehab PT Goals PT Goal Formulation: All assessment and education complete, DC therapy    Frequency       Co-evaluation               AM-PAC PT "6 Clicks" Mobility  Outcome Measure Help needed turning from your back to your side while in a flat bed without using bedrails?: None Help needed moving from lying on your back to sitting on the side of a flat bed without using bedrails?: None Help needed moving to and from a bed to a chair (including a wheelchair)?: None Help needed standing up from a chair using your arms (e.g., wheelchair or bedside chair)?: None Help needed to walk in hospital room?: None Help needed climbing 3-5 steps with a railing? : None 6 Click Score: 24    End of Session Equipment Utilized During Treatment: Gait belt Activity Tolerance: Patient tolerated treatment well Patient left: in bed;with family/visitor present Nurse Communication: Mobility status;Other (comment) (RN OK with leaving bed alarm off) PT Visit Diagnosis: Other symptoms and signs involving the nervous system (J82.505)    Time: 3976-7341 PT Time Calculation (min) (ACUTE ONLY): 11 min   Charges:   PT Evaluation $PT Eval Low Complexity: 1 Low           Jerolyn Center, PT Acute Rehabilitation Services  Office [phone]   Zena Amos 04/08/2022, 12:19 PM

## 2022-04-08 NOTE — Evaluation (Signed)
Occupational Therapy Evaluation Patient Details Name: Tiffany Mayo MRN: [id number] DOB: 25-Dec-1940 Today's Date: 04/08/2022   History of Present Illness 81 y.o. female presented to the ED 04/07/22 for evaluation of acute onset left hand/face numbness and tingling, acute neck and left shoulder pain in the setting of recent fall 2 days ago. CT head negative for acute finding.  PMH significant of hypertension, hyperlipidemia, hypothyroidism, GERD, ACDF   Clinical Impression   PTA, pt lived with husband and was independent in ADL and IADL. Upon eval, pt agreeable to eval, reporting she is not sure whether she feels 100% back to baseline. Pt performing UB ADL and LB ADL as well as tub/shower transfer with mod I overall. Pt performing functional mobility in hall and with one minor LOB, but able to self correct. Pt with slightly decreased strength of LUE as compared to R, but pt R handed. Able to perform finger opposition, finger to nose testing, and vision testing WFL. No acute OT needs identified. Providing education regarding positioning for comfort of L shoulder while sleeping, and education regarding BEFAST. Recommending discharge home with no follow up OT.       Recommendations for follow up therapy are one component of a multi-disciplinary discharge planning process, led by the attending physician.  Recommendations may be updated based on patient status, additional functional criteria and insurance authorization.   Follow Up Recommendations  No OT follow up    Assistance Recommended at Discharge PRN  Patient can return home with the following Other (comment) (as needed)    Functional Status Assessment  Patient has had a recent decline in their functional status and demonstrates the ability to make significant improvements in function in a reasonable and predictable amount of time.  Equipment Recommendations  None recommended by OT    Recommendations for Other Services        Precautions / Restrictions Precautions Precautions: None      Mobility Bed Mobility Overal bed mobility: Independent                  Transfers Overall transfer level: Modified independent Equipment used: None               General transfer comment: increased time      Balance Overall balance assessment: Mild deficits observed, not formally tested                                         ADL either performed or assessed with clinical judgement   ADL Overall ADL's : Modified independent                                             Vision Baseline Vision/History: 1 Wears glasses Ability to See in Adequate Light: 0 Adequate Patient Visual Report: Other (comment) (Pt reports vision was initially different when she came) Vision Assessment?: No apparent visual deficits Additional Comments: Visual pursuits, occular ROM, gaze, saccades WFL. L eye more difficulty with convergence close to nose, but overall [facility]     Perception     Praxis      Pertinent Vitals/Pain Pain Assessment Pain Assessment: Faces Faces Pain Scale: Hurts little more Pain Location: L shoulder Pain Descriptors / Indicators: Discomfort, Sore Pain Intervention(s): Limited activity within patient's tolerance  Hand Dominance Right   Extremity/Trunk Assessment Upper Extremity Assessment Upper Extremity Assessment: Overall WFL for tasks assessed (LUE slightly weak as compared to R, but using WFL. Finger opposition [facility].)   Lower Extremity Assessment Lower Extremity Assessment: Defer to PT evaluation   Cervical / Trunk Assessment Cervical / Trunk Assessment: Normal   Communication Communication Communication: No difficulties   Cognition Arousal/Alertness: Awake/alert Behavior During Therapy: WFL for tasks assessed/performed Overall Cognitive Status: Within Functional Limits for tasks assessed                                        General Comments  VSS. Pt reporting unsure if she feels back to normal. Agreeable to full OT eval. Pt cognition, strength, coordination [facility]    Exercises     Shoulder Instructions      Home Living Family/patient expects to be discharged to:: Private residence Living Arrangements: Spouse/significant other Available Help at Discharge: Family;Available 24 hours/day Type of Home: House Home Access: Stairs to enter [employer] of Steps: 3 Entrance Stairs-Rails: None Home Layout: Two level;Able to live on main level with bedroom/bathroom;Laundry or work area in basement Alternate Therapist, sports of Steps: flight   Bathroom Shower/Tub: Tub/shower unit;Walk-in shower   Bathroom Toilet: Standard     Home Equipment: None          Prior Functioning/Environment Prior Level of Function : Independent/Modified Independent;Driving               ADLs Comments: Likes to participate in [place] shows in her free time.        OT Problem List: Decreased strength;Impaired balance (sitting and/or standing);Pain      OT Treatment/Interventions:      OT Goals(Current goals can be found in the care plan section) Acute Rehab OT Goals Patient Stated Goal: go home OT Goal Formulation: With patient  OT Frequency:      Co-evaluation              AM-PAC OT "6 Clicks" Daily Activity     Outcome Measure Help from another person eating meals?: None Help from another person taking care of personal grooming?: None Help from another person toileting, which includes using toliet, bedpan, or urinal?: None Help from another person bathing (including washing, rinsing, drying)?: None Help from another person to put on and taking off regular upper body clothing?: None Help from another person to put on and taking off regular lower body clothing?: None 6 Click Score: 24   End of Session Nurse Communication: Mobility status  Activity Tolerance: Patient tolerated treatment  well Patient left: in bed  OT Visit Diagnosis: Unsteadiness on feet (R26.81);Muscle weakness (generalized) (M62.81);Pain;Other abnormalities of gait and mobility (R26.89) Pain - Right/Left: Left Pain - part of body: Shoulder                Time: 1250-1307 OT Time Calculation (min): 17 min Charges:  OT General Charges $OT Visit: 1 Visit OT Evaluation $OT Eval Low Complexity: [place], OTR/L [facility] Acute Rehabilitation Office: [phone]   Tiffany Mayo 04/08/2022, 1:17 PM

## 2022-04-08 NOTE — Progress Notes (Signed)
*  PRELIMINARY RESULTS* Echocardiogram 2D Echocardiogram has been performed.  Maren Reamer 04/08/2022, 12:09 PM

## 2022-04-08 NOTE — H&P (Signed)
History and Physical    Tiffany Mayo ACZ:[id number] DOB: 10/10/1940 DOA: 04/07/2022  PCP: Lewis Moccasin, MD  Patient coming from: [facility] ED  Chief Complaint: Left hand/face numbness and tingling  HPI: Tiffany Mayo is a 81 y.o. female with medical history significant of hypertension, hyperlipidemia, hypothyroidism, GERD presented to the ED for evaluation of acute onset left hand/face numbness and tingling, acute neck and left shoulder pain in the setting of recent fall 2 days ago.  She was evaluated at orthopedic urgent care and they did not feel that her symptoms were consistent with radiculopathy.  She was sent to the ED due to concern for stroke.  In the ED, patient bradycardic with heart rate in the 50s but remainder of vital signs stable.  No significant abnormalities on labs including CBC and CMP.  CT head negative for acute finding. ED physician discussed the case with on-call neurologist Dr. Iver Nestle who recommended admission for TIA versus stroke work-up.  Patient was given aspirin 324 mg and Lipitor 20 mg.  Patient states she slipped on leaves and fell on Monday (11/6).  After the fall, she started experiencing pain in her neck and left shoulder.  She is also reporting intermittent episodes of left-sided pressure-like chest pain which has been going on for several weeks, sometimes with exertion and sometimes even with rest and she thinks it might be related to indigestion.  On Tuesday (11/7) around 8 PM she started experiencing acute onset left hand numbness and tingling which then went up her arm all the way up to her shoulder and left cheek.  Patient states symptoms lasted about 3 hours and completely resolved, no recurrence since then.  She denies history of stroke.  States she was seen at an orthopedic urgent care yesterday and they did scans of her neck and shoulder which did not show any acute abnormalities and she was sent to the ED due to concern for possible stroke. Reports chronic  allergies and cough which she attributes to postnasal drip.  Denies fevers or shortness of breath.  No other complaints.  Review of Systems:  Review of Systems  All other systems reviewed and are negative.   Past Medical History:  Diagnosis Date   Arthritis    spine, neck   GERD (gastroesophageal reflux disease)    Headache    SINUS    Hemorrhoid    Hyperlipemia    Hypertension    Hypothyroidism     Past Surgical History:  Procedure Laterality Date   ABDOMINAL HYSTERECTOMY  1990   ANTERIOR CERVICAL DECOMP/DISCECTOMY FUSION  04/03/2012   Procedure: ANTERIOR CERVICAL DECOMPRESSION/DISCECTOMY FUSION 1 LEVEL;  Surgeon: Hewitt Shorts, MD;  Location: [facility];  Service: Neurosurgery;  Laterality: N/A;  Cervical Five-Six Anterior Cervical Decompression with Fusion Plating and Bonegraft   BUNIONECTOMY     RIGHT FOOT   EYE SURGERY     Lasik, Catarct   GANGLION CYST EXCISION     BIL WRIST    JOINT REPLACEMENT     Right middle finger   TRIGGER FINGER RELEASE     thumbs     reports that she has never smoked. She has never used smokeless tobacco. She reports current alcohol use. She reports that she does not use drugs.  Allergies  Allergen Reactions   Latex Rash   Codeine Nausea And Vomiting   Sulfa Antibiotics Nausea And Vomiting    History reviewed. No pertinent family history.  Prior to Admission medications  Medication Sig Start Date End Date Taking? Authorizing Provider  Cholecalciferol (VITAMIN D3) 1000 units CAPS Take 1,000 Units by mouth daily.     [provider]  ibuprofen (ADVIL,MOTRIN) 200 MG tablet Take 200 mg by mouth every 6 (six) hours as needed for headache.    [provider]  levothyroxine (SYNTHROID, LEVOTHROID) 25 MCG tablet Take 25 mcg by mouth daily.    [provider]  loratadine (CLARITIN) 10 MG tablet Take 10 mg by mouth daily as needed for allergies.    [provider]  losartan-hydrochlorothiazide  (HYZAAR) 50-12.5 MG per tablet Take 1 tablet by mouth daily.    [provider]  Multiple Vitamins-Minerals (HAIR SKIN AND NAILS FORMULA) TABS Take 2 tablets by mouth daily.    [provider]  omeprazole (PRILOSEC) 20 MG capsule Take 20 mg by mouth daily.    [provider]  tiZANidine (ZANAFLEX) 4 MG capsule Take 1 capsule (4 mg total) by mouth at bedtime as needed for muscle spasms. 08/22/17   Wallis Bamberg, PA-C    Physical Exam: Vitals:   04/07/22 2300 04/07/22 2355 04/08/22 0000 04/08/22 0125  BP: (!) 169/67  (!) 154/62 (!) 155/54  Pulse: (!) 56  (!) 55 (!) 52  Resp: 13  13 15   Temp:  98.2 F (36.8 C)  97.9 F (36.6 C)  TempSrc:    Oral  SpO2: 98%  97% 95%  Weight:      Height:        Physical Exam Vitals reviewed.  Constitutional:      General: She is not in acute distress. HENT:     Head: Normocephalic and atraumatic.  Eyes:     Extraocular Movements: Extraocular movements intact.  Cardiovascular:     Rate and Rhythm: Normal rate and regular rhythm.     Pulses: Normal pulses.  Pulmonary:     Effort: Pulmonary effort is normal. No respiratory distress.     Breath sounds: Normal breath sounds.  Abdominal:     General: Bowel sounds are normal. There is no distension.     Palpations: Abdomen is soft.     Tenderness: There is no abdominal tenderness.  Musculoskeletal:        General: No swelling or tenderness.     Cervical back: Normal range of motion.  Skin:    General: Skin is warm and dry.  Neurological:     General: No focal deficit present.     Mental Status: She is alert and oriented to person, place, and time.     Cranial Nerves: No cranial nerve deficit.     Sensory: No sensory deficit.     Motor: No weakness.     Labs on Admission: I have personally reviewed following labs and imaging studies  CBC: Recent Labs  Lab 04/07/22 1921  WBC 6.5  HGB 13.4  HCT 40.2  MCV 97.1  PLT 255   Basic Metabolic Panel: Recent Labs   Lab 04/07/22 1921  NA 139  K 4.0  CL 105  CO2 25  GLUCOSE 107*  BUN 20  CREATININE 0.93  CALCIUM 9.7   GFR: Estimated Creatinine Clearance: 41.4 mL/min (by C-G formula based on SCr of 0.93 mg/dL). Liver Function Tests: Recent Labs  Lab 04/07/22 1921  AST 20  ALT 14  ALKPHOS 47  BILITOT 0.4  PROT 7.3  ALBUMIN 4.4   No results for input(s): "LIPASE", "AMYLASE" in the last 168 hours. No results for input(s): "AMMONIA" in  the last 168 hours. Coagulation Profile: No results for input(s): "INR", "PROTIME" in the last 168 hours. Cardiac Enzymes: No results for input(s): "CKTOTAL", "CKMB", "CKMBINDEX", "TROPONINI" in the last 168 hours. BNP (last 3 results) No results for input(s): "PROBNP" in the last 8760 hours. HbA1C: No results for input(s): "HGBA1C" in the last 72 hours. CBG: No results for input(s): "GLUCAP" in the last 168 hours. Lipid Profile: No results for input(s): "CHOL", "HDL", "LDLCALC", "TRIG", "CHOLHDL", "LDLDIRECT" in the last 72 hours. Thyroid Function Tests: No results for input(s): "TSH", "T4TOTAL", "FREET4", "T3FREE", "THYROIDAB" in the last 72 hours. Anemia Panel: No results for input(s): "VITAMINB12", "FOLATE", "FERRITIN", "TIBC", "IRON", "RETICCTPCT" in the last 72 hours. Urine analysis:    Component Value Date/Time   COLORURINE YELLOW 03/05/2016 2026   APPEARANCEUR CLEAR 03/05/2016 2026   LABSPEC 1.015 03/05/2016 2026   PHURINE 5.0 03/05/2016 2026   GLUCOSEU NEGATIVE 03/05/2016 2026   HGBUR NEGATIVE 03/05/2016 2026   BILIRUBINUR NEGATIVE 03/05/2016 2026   KETONESUR NEGATIVE 03/05/2016 2026   PROTEINUR NEGATIVE 03/05/2016 2026   UROBILINOGEN 0.2 04/05/2012 1528   NITRITE NEGATIVE 03/05/2016 2026   LEUKOCYTESUR SMALL (A) 03/05/2016 2026    Radiological Exams on Admission: CT HEAD WO CONTRAST  Result Date: 04/07/2022 CLINICAL DATA:  Neurologic deficit. EXAM: CT HEAD WITHOUT CONTRAST TECHNIQUE: Contiguous axial images were obtained from the  base of the skull through the vertex without intravenous contrast. RADIATION DOSE REDUCTION: This exam was performed according to the departmental dose-optimization program which includes automated exposure control, adjustment of the mA and/or kV according to patient size and/or use of iterative reconstruction technique. COMPARISON:  Head CT dated 03/05/2016. FINDINGS: Brain: The ventricles and sulci are appropriate size for the patient's age. The gray-white matter discrimination is preserved. There is no acute intracranial hemorrhage. No mass effect or midline shift. No extra-axial fluid collection. Vascular: No hyperdense vessel or unexpected calcification. Skull: Normal. Negative for fracture or focal lesion. Sinuses/Orbits: No acute finding. Other: None IMPRESSION: No acute intracranial pathology. Electronically Signed   By: Elgie Collard M.D.   On: 04/07/2022 19:06    EKG: Independently reviewed.  Sinus bradycardia.  Assessment and Plan  Acute onset numbness and tingling involving left upper extremity and face Symptom onset around 8 PM on Tuesday (11/7) and symptoms lasted about 3 hours.  Neuro exam currently nonfocal.  She was evaluated at orthopedic urgent care and they did not feel that her symptoms were consistent with radiculopathy.  CT head negative for acute finding.  Given risk factors, neurology recommended admission for TIA versus stroke work-up. -Telemetry monitoring -MRI of the brain without contrast -Echocardiogram -Hemoglobin A1c, fasting lipid panel -Antiplatelet therapy per neurology recommendations -Frequent neurochecks -PT, OT, speech therapy. -N.p.o. until cleared by bedside swallow evaluation or formal speech evaluation   Chest pain Ongoing for several weeks.  EKG showing sinus bradycardia.  Not having active chest pain at this time and appears comfortable.  PE less likely given no tachycardia or hypoxia. -Cardiac monitoring -Check troponin  Sinus bradycardia Heart  rate in the 50s and blood pressure stable. No beta-blockers or calcium channel blockers listed in home meds but pharmacy med rec currently pending. -Cardiac monitoring  Hypertension -Allow permissive hypertension until brain MRI is done  Hyperlipidemia -Lipid panel  Hypothyroidism -Continue Synthroid after pharmacy med rec is done  GERD -Continue PPI after pharmacy med rec is done  DVT prophylaxis: SCDs Code Status: DNR (discussed with the patient) Family Communication: No family available at this time.  Consults called: Neurology Level of care: Telemetry bed Admission status: It is my clinical opinion that referral for OBSERVATION is reasonable and necessary in this patient based on the above information provided. The aforementioned taken together are felt to place the patient at high risk for further clinical deterioration. However, it is anticipated that the patient may be medically stable for discharge from the hospital within 24 to 48 hours.   John Giovanni MD [facility]  If 7PM-7AM, please contact night-coverage [email or web address]  04/08/2022, 1:34 AM

## 2022-04-08 NOTE — Plan of Care (Signed)
CTA reviewed, agree with radiology read: 1. No acute intracranial process. 2. No intracranial large vessel occlusion or significant stenosis. 3. No hemodynamically significant stenosis in the neck. 4. 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 5. 2 mm posteromedially directed outpouching from the left carotid terminus, which may represent an infundibulum at the origin of a now no longer seen left posterior communicating artery versus a tiny aneurysm. 6. 4 mm solid pulmonary nodule in the right upper lobe. No follow-up needed if patient is low-risk.This recommendation follows the consensus statement: Guidelines for Management of Incidental Pulmonary Nodules Detected on CT Images: From the Fleischner Society 2017; Radiology 2017; 284:228-243.  Small aneurysms can be followed up outpatient with serial imaging.  No change in plan from prior neurology note  Brooke Dare MD-PhD [facility] [phone] Available 7 PM to 7 AM, outside of these hours please call Neurologist on call as listed on Amion.

## 2022-04-08 NOTE — Progress Notes (Signed)
PT Cancellation Note  Patient Details Name: Tiffany Mayo MRN: [id number] DOB: [birth year]   Cancelled Treatment:    Reason Eval/Treat Not Completed: Patient at procedure or test/unavailable  Just back from MRI and eating breakfast.   Jerolyn Center, PT Acute Rehabilitation Services  Office [phone]   Zena Amos 04/08/2022, 10:47 AM

## 2022-04-08 NOTE — Discharge Summary (Signed)
[facility]  Physician Discharge Summary   Patient ID: Tiffany Mayo MRN: [id number] DOB/AGE: 1940-08-26 81 y.o.  Admit date: 04/07/2022 Discharge date: 04/08/2022    PCP: Fanny Bien, MD  DISCHARGE DIAGNOSES:    TIA (transient ischemic attack)   HTN (hypertension)   HLD (hyperlipidemia)   Hypothyroidism   GERD (gastroesophageal reflux disease)   Sinus bradycardia    RECOMMENDATIONS FOR OUTPATIENT FOLLOW UP: Ambulatory referral sent to neurology   Home Health: None Equipment/Devices: None  CODE STATUS: DNR  DISCHARGE CONDITION: fair  Diet recommendation: Heart healthy  INITIAL HISTORY: 81 y.o. female with medical history significant of hypertension, hyperlipidemia, hypothyroidism, GERD presented to the ED for evaluation of acute onset left hand/face numbness and tingling, acute neck and left shoulder pain in the setting of recent fall 2 days ago.  She was evaluated at orthopedic urgent care and they did not feel that her symptoms were consistent with radiculopathy.  She was sent to the ED due to concern for stroke.    Consultants: Neurology   Procedures:   Thoracic echocardiogram  EEG    HOSPITAL COURSE:   TIA Patient presented with left paresthesia with facial numbness. Concern for TIA.  Seen by neurology.  MRI brain does not show any acute infarct.  HbA1c 5.0.  LDL 138. Patient started on atorvastatin.  Echocardiogram shows normal systolic function.  EEG was done which raised concern for spikes in the left frontal lobe however her symptoms were left-sided.  Neurology feels that this is an incidental finding.  Does not recommend any antiepileptics at this time. Neurology recommends aspirin and Plavix for 3 weeks followed by aspirin alone.  Seen by PT and OT.  Okay for discharge today.  Brain aneurysms CT Angiogram did not show any significant stenosis.  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  Neurology recommends monitoring by imaging which can be pursued by  outpatient neurology.   Chest pain/sinus bradycardia Not on any AV blocking agents.  No chest pain currently.  Troponin was normal.  EKG did not show any ischemic findings.    Essential hypertension   Hypothyroidism Noted to be on levothyroxine 50 mcg every other day.  He has not noted to be 4.9 with a free T4 of 1.14.  No changes to treatment at this time.   Hyperlipidemia Crestor has been switched over to atorvastatin due to elevated LDL of 138.   GERD PPI  Patient is stable.  Cleared by neurology.  Okay for discharge today.   PERTINENT LABS:  The results of significant diagnostics from this hospitalization (including imaging, microbiology, ancillary and laboratory) are listed below for reference.     Labs:   Basic Metabolic Panel: Recent Labs  Lab 04/07/22 1921  NA 139  K 4.0  CL 105  CO2 25  GLUCOSE 107*  BUN 20  CREATININE 0.93  CALCIUM 9.7   Liver Function Tests: Recent Labs  Lab 04/07/22 1921  AST 20  ALT 14  ALKPHOS 47  BILITOT 0.4  PROT 7.3  ALBUMIN 4.4    CBC: Recent Labs  Lab 04/07/22 1921  WBC 6.5  HGB 13.4  HCT 40.2  MCV 97.1  PLT 255     IMAGING STUDIES CT ANGIO HEAD NECK W WO CM  Result Date: 04/08/2022 CLINICAL DATA:  Acute onset left hand and face numbness and tingling; TIA EXAM: CT ANGIOGRAPHY HEAD AND NECK TECHNIQUE: Multidetector CT imaging of the head and neck was performed using the standard protocol during bolus administration of  intravenous contrast. Multiplanar CT image reconstructions and MIPs were obtained to evaluate the vascular anatomy. Carotid stenosis measurements (when applicable) are obtained utilizing NASCET criteria, using the distal internal carotid diameter as the denominator. RADIATION DOSE REDUCTION: This exam was performed according to the departmental dose-optimization program which includes automated exposure control, adjustment of the mA and/or kV according to patient size and/or use of iterative  reconstruction technique. CONTRAST:  50mL OMNIPAQUE IOHEXOL 350 MG/ML SOLN COMPARISON:  No prior CTA, correlation is made with CT head 04/07/2022 FINDINGS: CT HEAD FINDINGS Brain: No evidence of acute infarct, hemorrhage, mass, mass effect, or midline shift. No hydrocephalus or extra-axial fluid collection. Vascular: No hyperdense vessel. Skull: Negative for fracture or focal lesion. Partially calcified left frontal scalp lesion. Sinuses/Orbits: No acute finding. Status post bilateral lens replacements. Other: The mastoid air cells are well aerated. CTA NECK FINDINGS Aortic arch: 4 vessel arch, with the origin of the left vertebral artery from the aorta. Imaged portion shows no evidence of aneurysm or dissection. No significant stenosis of the major arch vessel origins. Right carotid system: No evidence of dissection, occlusion, or hemodynamically significant stenosis (greater than 50%). Left carotid system: No evidence of dissection, occlusion, or hemodynamically significant stenosis (greater than 50%). Vertebral arteries: Evaluation is somewhat limited by beam hardening artifact from contrast in the adjacent veins. Within this limitation, no evidence of stenosis, dissection, or occlusion. Skeleton: No acute osseous abnormality.  ACDF C5-C6. Other neck: 7 mm hypoenhancing lesion in the inferior left thyroid, for which no follow-up is currently indicated. (Reference: J Am Coll Radiol. 2015 Feb;12(2): 143-50) Upper chest: 4 mm solid pulmonary nodule in the right upper lobe (series 12, image 322). No pleural effusion. Left apical scarring. Review of the MIP images confirms the above findings CTA HEAD FINDINGS Anterior circulation: Both internal carotid arteries are patent to the termini, with mild calcifications but without significant stenosis. 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 2 mm posteromedially directed outpouching from the left  carotid terminus (series 12, image 117), which may represent an infundibulum at the origin of a now no longer seen left posterior communicating artery versus a tiny aneurysm. A1 segments patent. Normal anterior communicating artery. Anterior cerebral arteries are patent to their distal aspects. No M1 stenosis or occlusion. MCA branches perfused and symmetric. Posterior circulation: Vertebral arteries patent to the vertebrobasilar junction without stenosis. Posterior inferior cerebellar artery patent on the right. Prominent left AICA. Basilar patent to its distal aspect. Superior cerebellar arteries patent proximally. Patent P1 segments. PCAs perfused to their distal aspects without stenosis. A diminutive right posterior communicating artery is visualized. Venous sinuses: As permitted by contrast timing, patent. Anatomic variants: None significant. Review of the MIP images confirms the above findings IMPRESSION: 1. No acute intracranial process. 2. No intracranial large vessel occlusion or significant stenosis. 3. No hemodynamically significant stenosis in the neck. 4. 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 5. 2 mm posteromedially directed outpouching from the left carotid terminus, which may represent an infundibulum at the origin of a now no longer seen left posterior communicating artery versus a tiny aneurysm. 6. 4 mm solid pulmonary nodule in the right upper lobe. No follow-up needed if patient is low-risk.This recommendation follows the consensus statement: Guidelines for Management of Incidental Pulmonary Nodules Detected on CT Images: From the Fleischner Society 2017; Radiology 2017; 284:228-243. Electronically Signed   By: Wiliam Ke  M.D.   On: 04/08/2022 20:26   EEG adult  Result Date: 04/08/2022 Lora Havens, MD     04/08/2022  4:36 PM Patient Name: Tiffany Mayo MRN: [id number] Epilepsy Attending: Lora Havens Referring  Physician/Provider: Bonnielee Haff, MD Date: 04/06/2022 Duration: 27.13 mins Patient history:  81yo F with left hand/face numbness and tingling. EEG to evaluate for seizure Level of alertness: Awake, asleep AEDs during EEG study: Ativan Technical aspects: This EEG study was done with scalp electrodes positioned according to the 10-20 International system of electrode placement. Electrical activity was reviewed with band pass filter of 1-70Hz , sensitivity of 7 uV/mm, display speed of 58mm/sec with a 60Hz  notched filter applied as appropriate. EEG data were recorded continuously and digitally stored.  Video monitoring was available and reviewed as appropriate. Description: The posterior dominant rhythm consists of 8 Hz activity of moderate voltage (25-35 uV) seen predominantly in posterior head regions, symmetric and reactive to eye opening and eye closing. Sleep was characterized by vertex waves, sleep spindles (12 to 14 Hz), maximal frontocentral region. Sharp waves were noted in left frontal region. Hyperventilation and photic stimulation were not performed.   ABNORMALITY -Sharp wave, left frontal region IMPRESSION: This study showed evidence of potential epileptogenicity arising from LEFT frontal region. No seizures  were seen throughout the recording. Dr. Lorrin Goodell was notified. Lora Havens   ECHOCARDIOGRAM COMPLETE  Result Date: 04/08/2022    ECHOCARDIOGRAM REPORT   Patient Name:   Tiffany Mayo Date of Exam: 04/08/2022 Medical Rec #:  [id number]      Height:       62.0 in Accession #:    [id number]     Weight:       139.0 lb Date of Birth:  01-Mar-1941      BSA:          1.638 m Patient Age:    75 years       BP:           148/76 mmHg Patient Gender: F              HR:           56 bpm. Exam Location:  Inpatient Procedure: 2D Echo, 3D Echo, Color Doppler and Limited Color Doppler Indications:    Stroke I63.9  History:        Patient has no prior history of Echocardiogram examinations.                  TIA, Arrythmias:Bradycardia, Signs/Symptoms:Chest Pain; Risk                 Factors:Hypertension and Dyslipidemia.  Sonographer:    Greer Pickerel Referring Phys: [id number] RATHORE  Sonographer Comments: Image acquisition challenging due to patient body habitus and Image acquisition challenging due to respiratory motion. IMPRESSIONS  1. Left ventricular ejection fraction, by estimation, is 55 to 60%. The left ventricle has normal function. The left ventricle has no regional wall motion abnormalities. Left ventricular diastolic parameters were normal.  2. Right ventricular systolic function is normal. The right ventricular size is normal. There is normal pulmonary artery systolic pressure.  3. The mitral valve is abnormal. No evidence of mitral valve regurgitation. No evidence of mitral stenosis.  4. The aortic valve is tricuspid. There is moderate calcification of the aortic valve. There is moderate thickening of the aortic valve. Aortic valve regurgitation is mild. Aortic valve sclerosis is present, with no evidence of aortic valve stenosis.  5.  The inferior vena cava is normal in size with greater than 50% respiratory variability, suggesting right atrial pressure of 3 mmHg. FINDINGS  Left Ventricle: Left ventricular ejection fraction, by estimation, is 55 to 60%. The left ventricle has normal function. The left ventricle has no regional wall motion abnormalities. The left ventricular internal cavity size was normal in size. There is  no left ventricular hypertrophy. Left ventricular diastolic parameters were normal. Right Ventricle: The right ventricular size is normal. No increase in right ventricular wall thickness. Right ventricular systolic function is normal. There is normal pulmonary artery systolic pressure. The tricuspid regurgitant velocity is 1.76 m/s, and  with an assumed right atrial pressure of 3 mmHg, the estimated right ventricular systolic pressure is 123XX123 mmHg. Left Atrium: Left atrial size  was normal in size. Right Atrium: Right atrial size was normal in size. Pericardium: There is no evidence of pericardial effusion. Mitral Valve: The mitral valve is abnormal. There is mild thickening of the mitral valve leaflet(s). There is mild calcification of the mitral valve leaflet(s). No evidence of mitral valve regurgitation. No evidence of mitral valve stenosis. Tricuspid Valve: The tricuspid valve is normal in structure. Tricuspid valve regurgitation is mild . No evidence of tricuspid stenosis. Aortic Valve: The aortic valve is tricuspid. There is moderate calcification of the aortic valve. There is moderate thickening of the aortic valve. Aortic valve regurgitation is mild. Aortic regurgitation PHT measures 709 msec. Aortic valve sclerosis is present, with no evidence of aortic valve stenosis. Pulmonic Valve: The pulmonic valve was normal in structure. Pulmonic valve regurgitation is not visualized. No evidence of pulmonic stenosis. Aorta: The aortic root is normal in size and structure. Venous: The inferior vena cava is normal in size with greater than 50% respiratory variability, suggesting right atrial pressure of 3 mmHg. IAS/Shunts: No atrial level shunt detected by color flow Doppler.  LEFT VENTRICLE PLAX 2D LVIDd:         4.80 cm   Diastology LVIDs:         2.50 cm   LV e' medial:    5.03 cm/s LV PW:         1.00 cm   LV E/e' medial:  12.7 LV IVS:        0.60 cm   LV e' lateral:   7.22 cm/s LVOT diam:     1.60 cm   LV E/e' lateral: 8.8 LV SV:         46 LV SV Index:   28 LVOT Area:     2.01 cm                           3D Volume EF:                          3D EF:        57 %                          LV EDV:       93 ml                          LV ESV:       40 ml                          LV SV:  53 ml RIGHT VENTRICLE RV S prime:     11.40 cm/s TAPSE (M-mode): 2.6 cm LEFT ATRIUM             Index        RIGHT ATRIUM           Index LA diam:        3.10 cm 1.89 cm/m   RA Area:     13.20 cm LA  Vol (A2C):   36.3 ml 22.16 ml/m  RA Volume:   30.70 ml  18.74 ml/m LA Vol (A4C):   50.9 ml 31.08 ml/m LA Biplane Vol: 42.8 ml 26.13 ml/m  AORTIC VALVE LVOT Vmax:   86.20 cm/s LVOT Vmean:  58.800 cm/s LVOT VTI:    0.229 m AI PHT:      709 msec  AORTA Ao Root diam: 3.00 cm Ao Asc diam:  2.70 cm MITRAL VALVE               TRICUSPID VALVE MV Area (PHT): 3.30 cm    TR Peak grad:   12.4 mmHg MV Decel Time: 230 msec    TR Vmax:        176.00 cm/s MV E velocity: 63.80 cm/s MV A velocity: 85.70 cm/s  SHUNTS MV E/A ratio:  0.74        Systemic VTI:  0.23 m                            Systemic Diam: 1.60 cm Jenkins Rouge MD Electronically signed by Jenkins Rouge MD Signature Date/Time: 04/08/2022/12:17:43 PM    Final    MR CERVICAL SPINE WO CONTRAST  Result Date: 04/08/2022 CLINICAL DATA:  Neck pain, chronic degenerative changes. EXAM: MRI CERVICAL SPINE WITHOUT CONTRAST TECHNIQUE: Multiplanar, multisequence MR imaging of the cervical spine was performed. No intravenous contrast was administered. COMPARISON:  MRI of the cervical spine December 03, 2011. FINDINGS: Alignment: Straightening of the cervical curvature. Vertebrae: No fracture, evidence of discitis, or bone lesion. Postsurgical changes from C5-6 ACDF. Cord: Normal signal and morphology. Posterior Fossa, vertebral arteries, paraspinal tissues: Negative. Disc levels: C2-3: No spinal canal or neural foraminal stenosis. C3-4: Prominent hypertrophic left facet degenerative changes. No significant spinal canal or neural foraminal stenosis. C4-5: Facet degenerative changes. No significant spinal canal or neural foraminal stenosis. C5-6: No spinal canal or neural foraminal stenosis. C6-7: Small posterior disc osteophyte complex and uncovertebral degenerative changes. No significant spinal canal or neural foraminal stenosis. C7-T1: Mild facet degenerative changes. No significant spinal canal or neural foraminal stenosis. IMPRESSION: Mild degenerative changes of the cervical  spine without high-grade spinal canal or neural foraminal stenosis at any level. Electronically Signed   By: Pedro Earls M.D.   On: 04/08/2022 11:02   MR BRAIN WO CONTRAST  Result Date: 04/08/2022 CLINICAL DATA:  Acute onset left and/face numbness and tingling, acute neck and left shoulder pain EXAM: MRI HEAD WITHOUT CONTRAST TECHNIQUE: Multiplanar, multiecho pulse sequences of the brain and surrounding structures were obtained without intravenous contrast. COMPARISON:  03/05/2016 MRI head, correlation is also made with 04/07/2022 CT head FINDINGS: Brain: No restricted diffusion to suggest acute or subacute infarct. No acute hemorrhage, mass, mass effect, midline shift. No hydrocephalus or extra-axial collection. Cerebral volume is within normal limits for age. Scattered T2 hyperintense signal in the periventricular white matter and pons, likely the sequela of mild-to-moderate chronic small vessel ischemic disease. Vascular: Normal arterial flow voids. Skull and  upper cervical spine: Normal marrow signal. Sinuses/Orbits: Clear paranasal sinuses. Status post bilateral lens replacements. Other: The mastoids are well aerated. IMPRESSION: No acute intracranial process. No evidence of acute or subacute infarct. Electronically Signed   By: Merilyn Baba M.D.   On: 04/08/2022 04:00   CT HEAD WO CONTRAST  Result Date: 04/07/2022 CLINICAL DATA:  Neurologic deficit. EXAM: CT HEAD WITHOUT CONTRAST TECHNIQUE: Contiguous axial images were obtained from the base of the skull through the vertex without intravenous contrast. RADIATION DOSE REDUCTION: This exam was performed according to the departmental dose-optimization program which includes automated exposure control, adjustment of the mA and/or kV according to patient size and/or use of iterative reconstruction technique. COMPARISON:  Head CT dated 03/05/2016. FINDINGS: Brain: The ventricles and sulci are appropriate size for the patient's age. The  gray-white matter discrimination is preserved. There is no acute intracranial hemorrhage. No mass effect or midline shift. No extra-axial fluid collection. Vascular: No hyperdense vessel or unexpected calcification. Skull: Normal. Negative for fracture or focal lesion. Sinuses/Orbits: No acute finding. Other: None IMPRESSION: No acute intracranial pathology. Electronically Signed   By: Anner Crete M.D.   On: 04/07/2022 19:06    DISCHARGE EXAMINATION: See progress note from earlier today  DISPOSITION: Home  Discharge Instructions     Ambulatory referral to Neurology   Complete by: As directed    An appointment is requested in approximately: 8 weeks   Call MD for:  difficulty breathing, headache or visual disturbances   Complete by: As directed    Call MD for:  extreme fatigue   Complete by: As directed    Call MD for:  persistant dizziness or light-headedness   Complete by: As directed    Call MD for:  persistant nausea and vomiting   Complete by: As directed    Call MD for:  severe uncontrolled pain   Complete by: As directed    Call MD for:  temperature >100.4   Complete by: As directed    Diet - low sodium heart healthy   Complete by: As directed    Discharge instructions   Complete by: As directed    Please take your medications as prescribed. Please f/u with your PCP within 1 week.  You were cared for by a hospitalist during your hospital stay. If you have any questions about your discharge medications or the care you received while you were in the hospital after you are discharged, you can call the unit and asked to speak with the hospitalist on call if the hospitalist that took care of you is not available. Once you are discharged, your primary care physician will handle any further medical issues. Please note that NO REFILLS for any discharge medications will be authorized once you are discharged, as it is imperative that you return to your primary care physician (or  establish a relationship with a primary care physician if you do not have one) for your aftercare needs so that they can reassess your need for medications and monitor your lab values. If you do not have a primary care physician, you can call [phone] for a physician referral.   Increase activity slowly   Complete by: As directed           Allergies as of 04/08/2022       Reactions   Latex Rash   Codeine Nausea And Vomiting   Sulfa Antibiotics Nausea And Vomiting        Medication List  STOP taking these medications    ibuprofen 200 MG tablet Commonly known as: ADVIL   rosuvastatin 5 MG tablet Commonly known as: CRESTOR       TAKE these medications    aspirin EC 81 MG tablet Take 1 tablet (81 mg total) by mouth daily. Swallow whole.   atorvastatin 40 MG tablet Commonly known as: LIPITOR Take 1 tablet (40 mg total) by mouth daily.   clopidogrel 75 MG tablet Commonly known as: PLAVIX Take 1 tablet (75 mg total) by mouth daily for 21 days. For 3 weeks only   esomeprazole 20 MG capsule Commonly known as: NEXIUM Take 20 mg by mouth daily.   levothyroxine 50 MCG tablet Commonly known as: SYNTHROID Take 50 mcg by mouth every other day. What changed: Another medication with the same name was removed. Continue taking this medication, and follow the directions you see here.   olmesartan 20 MG tablet Commonly known as: BENICAR Take 20 mg by mouth daily.   sucralfate 1 g tablet Commonly known as: CARAFATE Take 1 g by mouth 2 (two) times daily.   triamcinolone cream 0.1 % Commonly known as: KENALOG Apply 1 Application topically daily as needed (irritation).   Vitamin D3 25 MCG (1000 UT) Caps Take 1,000 Units by mouth daily.           TOTAL DISCHARGE TIME: 35 minutes  Lai Hendriks [employer] on [email or web address]  04/09/2022, 10:44 AM

## 2022-04-16 DIAGNOSIS — E039 Hypothyroidism, unspecified: Secondary | ICD-10-CM | POA: Diagnosis not present

## 2022-04-16 DIAGNOSIS — I10 Essential (primary) hypertension: Secondary | ICD-10-CM | POA: Diagnosis not present

## 2022-04-16 DIAGNOSIS — E7800 Pure hypercholesterolemia, unspecified: Secondary | ICD-10-CM | POA: Diagnosis not present

## 2022-04-16 DIAGNOSIS — F4323 Adjustment disorder with mixed anxiety and depressed mood: Secondary | ICD-10-CM | POA: Diagnosis not present

## 2022-04-16 DIAGNOSIS — Z8673 Personal history of transient ischemic attack (TIA), and cerebral infarction without residual deficits: Secondary | ICD-10-CM | POA: Diagnosis not present

## 2022-04-16 DIAGNOSIS — K219 Gastro-esophageal reflux disease without esophagitis: Secondary | ICD-10-CM | POA: Diagnosis not present

## 2022-04-27 ENCOUNTER — Ambulatory Visit: Payer: Medicare PPO | Admitting: Neurology

## 2022-04-27 ENCOUNTER — Encounter: Payer: Self-pay | Admitting: Neurology

## 2022-04-27 VITALS — BP 137/64 | HR 61 | Ht 62.0 in | Wt 141.0 lb

## 2022-04-27 DIAGNOSIS — R200 Anesthesia of skin: Secondary | ICD-10-CM | POA: Diagnosis not present

## 2022-04-27 DIAGNOSIS — G459 Transient cerebral ischemic attack, unspecified: Secondary | ICD-10-CM

## 2022-04-27 DIAGNOSIS — E785 Hyperlipidemia, unspecified: Secondary | ICD-10-CM | POA: Diagnosis not present

## 2022-04-27 NOTE — Progress Notes (Signed)
[facility] [address] [place].  [address] [phone]       OFFICE CONSULT NOTE  Ms. Tiffany Mayo Date of Birth:  Jun 07, 1940 Medical Record Number:  [id number]   Referring MD: Osvaldo Shipper  Reason for Referral: TIA  HPI: Tiffany Mayo is a 81 year old pleasant Caucasian lady seen today for initial office consultation visit for TIA.  History is obtained from the patient and review of electronic medical records and I personally reviewed pertinent available imaging films in PACS.  She has past medical history of hypertension, hyperlipidemia, hypothyroidism and gastroesophageal reflux disease.  He presented on 04/08/2022 for evaluation for a 3-hour episode of left fingertip gradually spread to involve left arm, , left side of the neck as well as face.  She denies any accompanying slurred speech , mass of the difficulty.  She denied any accompanying headache, slurred speech or gait difficulty.  She fell asleep and next day next day when she woke up in morning the numbness was gone.  She has had no recurrent episodes since then.  She denies any prior history of TIA, stroke or significant neurological problems.  He has no history of migraine headaches.  She admits she has been under significant stress recently since her husband diagnosed with memory problems and possible dementia.  Patient underwent MRI of The Brain which did not show any acute abnormality.  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  She has no family history of ruptured brain aneurysm he has risk factors of uncontrolled hypertension, smoking known prior aneurysms in the brain.  LDL cholesterol was 138 mg percent and hemoglobin A1c was 5.0.  Patient was commended to take Lipitor 40 mg daily  but her primary care physician has changed her to taking it 3 days a week instead now that she is tolerating well without bruising or bleeding..  Blood pressure in the good control today it is 137/64..   ROS:   14 system review of systems is positive for numbness, fall, headache and neck pain only all other systems negative  PMH:  Past Medical History:  Diagnosis Date   Arthritis    spine, neck   GERD (gastroesophageal reflux disease)    Headache    SINUS    Hemorrhoid    Hyperlipemia    Hypertension    Hypothyroidism     Social History:  Social History   Socioeconomic History   Marital status: Married    Spouse name: Not on file   Number of children: Not on file   Years of education: Not on file   Highest education level: Not on file  Occupational History   Not on file  Tobacco Use   Smoking status: Never   Smokeless tobacco: Never  Substance and Sexual Activity   Alcohol use: Yes    Comment: OCC WINE    Drug use: No   Sexual activity: Not on file  Other Topics Concern   Not on file  Social History Narrative   Not on file   Social Determinants of Health   Financial Resource Strain: Not on file  Food Insecurity: No Food Insecurity (04/08/2022)   Hunger Vital Sign    Worried About Running Out of Food in the Last Year: Never true  Ran Out of Food in the Last Year: Never true  Transportation Needs: No Transportation Needs (04/08/2022)   PRAPARE - Administrator, Civil Service (Medical): No    Lack of Transportation (Non-Medical): No  Physical Activity: Not on file  Stress: Not on file  Social Connections: Not on file  Intimate Partner Violence: Not At Risk (04/08/2022)   Humiliation, Afraid, Rape, and Kick questionnaire    Fear of Current or Ex-Partner: No    Emotionally Abused: No    Physically Abused: No    Sexually Abused: No    Medications:   Current Outpatient Medications on File Prior to Visit  Medication Sig Dispense Refill   aspirin  EC 81 MG tablet Take 1 tablet (81 mg total) by mouth daily. Swallow whole. 30 tablet 11   clopidogrel (PLAVIX) 75 MG tablet Take 1 tablet (75 mg total) by mouth daily for 21 days. For 3 weeks only 21 tablet 0   esomeprazole (NEXIUM) 20 MG capsule Take 20 mg by mouth daily.     levothyroxine (SYNTHROID) 50 MCG tablet Take 50 mcg by mouth every other day.     levothyroxine (SYNTHROID) 75 MCG tablet Take 75 mcg by mouth every other day.     olmesartan (BENICAR) 20 MG tablet Take 20 mg by mouth daily.     atorvastatin (LIPITOR) 40 MG tablet Take 1 tablet (40 mg total) by mouth daily. 30 tablet 2   sucralfate (CARAFATE) 1 g tablet Take 1 g by mouth 2 (two) times daily. (Patient not taking: Reported on 04/08/2022)     No current facility-administered medications on file prior to visit.    Allergies:   Allergies  Allergen Reactions   Latex Rash   Codeine Nausea And Vomiting   Sulfa Antibiotics Nausea And Vomiting    Physical Exam General: Pleasant frail elderly Caucasian lady seated, in no evident distress Head: head normocephalic and atraumatic.   Neck: supple with no carotid or supraclavicular bruits Cardiovascular: regular rate and rhythm, no murmurs Musculoskeletal: no deformity Skin:  no rash/petichiae Vascular:  Normal pulses all extremities  Neurologic Exam Mental Status: Awake and fully alert. Oriented to place and time. Recent and remote memory intact. Attention span, concentration and fund of knowledge appropriate. Mood and affect appropriate.  Cranial Nerves: Fundoscopic exam reveals sharp disc margins. Pupils equal, briskly reactive to light. Extraocular movements full without nystagmus. Visual fields full to confrontation. Hearing intact. Facial sensation intact. Face, tongue, palate moves normally and symmetrically.  Motor: Normal bulk and tone. Normal strength in all tested extremity muscles. Sensory.: intact to touch , pinprick , position and vibratory sensation.   Coordination: Rapid alternating movements normal in all extremities. Finger-to-nose and heel-to-shin performed accurately bilaterally. Gait and Station: Arises from chair without difficulty. Stance is normal. Gait demonstrates normal stride length and balance . Able to heel, toe and tandem walk without difficulty.  Reflexes: 1+ and symmetric. Toes downgoing.   NIHSS  0 Modified Rankin  0   ASSESSMENT: 81 year old Caucasian lady with episode of transient left upper extremity and face paresthesias in November 2023 possibly right brain subcortical TIA from small vessel disease.  Atypical migraine or focal seizures less likely.  Vascular risk factors of hypertension and hyperlipidemia.  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     PLAN:I had a long d/w patient about his recent stroke, risk for recurrent stroke/TIAs, personally independently reviewed imaging studies and stroke evaluation results and  answered questions.Continue aspirin 81 mg daily  for secondary stroke prevention  and stop plavix after 1 week and maintain strict control of hypertension with blood pressure goal below 130/90, diabetes with hemoglobin A1c goal below 6.5% and lipids with LDL cholesterol goal below 70 mg/dL. I also advised the patient to eat a healthy diet with plenty of whole grains, cereals, fruits and vegetables, exercise regularly and maintain ideal body weight .continue conservative follow-up for 2 mm terminal left ICA aneurysm and she does not have any associated high risk features or risk factors for aneurysm rupture. Followup in the future with my NP in 3 months or call earlier if needed.  Greater than 50% time during this 45-minute consultation visit was spent in coordination of care about her episode of numbness and TIA and answering questions  Delia Heady, MD   Note: This document was prepared with digital dictation and possible smart phrase technology. Any  transcriptional errors that result from this process are unintentional.

## 2022-04-27 NOTE — Patient Instructions (Signed)
I had a long d/w patient about his recent stroke, risk for recurrent stroke/TIAs, personally independently reviewed imaging studies and stroke evaluation results and answered questions.Continue aspirin 81 mg daily  for secondary stroke prevention  and stop plavix after 1 week and maintain strict control of hypertension with blood pressure goal below 130/90, diabetes with hemoglobin A1c goal below 6.5% and lipids with LDL cholesterol goal below 70 mg/dL. I also advised the patient to eat a healthy diet with plenty of whole grains, cereals, fruits and vegetables, exercise regularly and maintain ideal body weight Followup in the future with my NP in 3 months or call earlier if needed.  Stroke Prevention Some medical conditions and behaviors can lead to a higher chance of having a stroke. You can help prevent a stroke by eating healthy, exercising, not smoking, and managing any medical conditions you have. Stroke is a leading cause of functional impairment. Primary prevention is particularly important because a majority of strokes are first-time events. Stroke changes the lives of not only those who experience a stroke but also their family and other caregivers. How can this condition affect me? A stroke is a medical emergency and should be treated right away. A stroke can lead to brain damage and can sometimes be life-threatening. If a person gets medical treatment right away, there is a better chance of surviving and recovering from a stroke. What can increase my risk? The following medical conditions may increase your risk of a stroke: Cardiovascular disease. High blood pressure (hypertension). Diabetes. High cholesterol. Sickle cell disease. Blood clotting disorders (hypercoagulable state). Obesity. Sleep disorders (obstructive sleep apnea). Other risk factors include: Being older than age 29. Having a history of blood clots, stroke, or mini-stroke (transient ischemic attack, TIA). Genetic  factors, such as race, ethnicity, or a family history of stroke. Smoking cigarettes or using other tobacco products. Taking birth control pills, especially if you also use tobacco. Heavy use of alcohol or drugs, especially cocaine and methamphetamine. Physical inactivity. What actions can I take to prevent this? Manage your health conditions High cholesterol levels. Eating a healthy diet is important for preventing high cholesterol. If cholesterol cannot be managed through diet alone, you may need to take medicines. Take any prescribed medicines to control your cholesterol as told by your health care provider. Hypertension. To reduce your risk of stroke, try to keep your blood pressure below 130/80. Eating a healthy diet and exercising regularly are important for controlling blood pressure. If these steps are not enough to manage your blood pressure, you may need to take medicines. Take any prescribed medicines to control hypertension as told by your health care provider. Ask your health care provider if you should monitor your blood pressure at home. Have your blood pressure checked every year, even if your blood pressure is normal. Blood pressure increases with age and some medical conditions. Diabetes. Eating a healthy diet and exercising regularly are important parts of managing your blood sugar (glucose). If your blood sugar cannot be managed through diet and exercise, you may need to take medicines. Take any prescribed medicines to control your diabetes as told by your health care provider. Get evaluated for obstructive sleep apnea. Talk to your health care provider about getting a sleep evaluation if you snore a lot or have excessive sleepiness. Make sure that any other medical conditions you have, such as atrial fibrillation or atherosclerosis, are managed. Nutrition Follow instructions from your health care provider about what to eat or drink to help manage  your health condition. These  instructions may include: Reducing your daily calorie intake. Limiting how much salt (sodium) you use to 1,500 milligrams (mg) each day. Using only healthy fats for cooking, such as olive oil, canola oil, or sunflower oil. Eating healthy foods. You can do this by: Choosing foods that are high in fiber, such as whole grains, and fresh fruits and vegetables. Eating at least 5 servings of fruits and vegetables a day. Try to fill one-half of your plate with fruits and vegetables at each meal. Choosing lean protein foods, such as lean cuts of meat, poultry without skin, fish, tofu, beans, and nuts. Eating low-fat dairy products. Avoiding foods that are high in sodium. This can help lower blood pressure. Avoiding foods that have saturated fat, trans fat, and cholesterol. This can help prevent high cholesterol. Avoiding processed and prepared foods. Counting your daily carbohydrate intake.  Lifestyle If you drink alcohol: Limit how much you have to: 0-1 drink a day for women who are not pregnant. 0-2 drinks a day for men. Know how much alcohol is in your drink. In the [place] one drink equals one 12 oz bottle of beer (35mL), one 5 oz glass of wine (194mL), or one 1 oz glass of hard liquor (27mL). Do not use any products that contain nicotine or tobacco. These products include cigarettes, chewing tobacco, and vaping devices, such as e-cigarettes. If you need help quitting, ask your health care provider. Avoid secondhand smoke. Do not use drugs. Activity  Try to stay at a healthy weight. Get at least 30 minutes of exercise on most days, such as: Fast walking. Biking. Swimming. Medicines Take over-the-counter and prescription medicines only as told by your health care provider. Aspirin or blood thinners (antiplatelets or anticoagulants) may be recommended to reduce your risk of forming blood clots that can lead to stroke. Avoid taking birth control pills. Talk to your health care provider about  the risks of taking birth control pills if: You are over 58 years old. You smoke. You get very bad headaches. You have had a blood clot. Where to find more information American Stroke Association: www.strokeassociation.org Get help right away if: You or a loved one has any symptoms of a stroke. "BE FAST" is an easy way to remember the main warning signs of a stroke: B - Balance. Signs are dizziness, sudden trouble walking, or loss of balance. E - Eyes. Signs are trouble seeing or a sudden change in vision. F - Face. Signs are sudden weakness or numbness of the face, or the face or eyelid drooping on one side. A - Arms. Signs are weakness or numbness in an arm. This happens suddenly and usually on one side of the body. S - Speech. Signs are sudden trouble speaking, slurred speech, or trouble understanding what people say. T - Time. Time to call emergency services. Write down what time symptoms started. You or a loved one has other signs of a stroke, such as: A sudden, severe headache with no known cause. Nausea or vomiting. Seizure. These symptoms may represent a serious problem that is an emergency. Do not wait to see if the symptoms will go away. Get medical help right away. Call your local emergency services (911 in the [place] Do not drive yourself to the hospital. Summary You can help to prevent a stroke by eating healthy, exercising, not smoking, limiting alcohol intake, and managing any medical conditions you may have. Do not use any products that contain nicotine or tobacco. These  include cigarettes, chewing tobacco, and vaping devices, such as e-cigarettes. If you need help quitting, ask your health care provider. Remember "BE FAST" for warning signs of a stroke. Get help right away if you or a loved one has any of these signs. This information is not intended to replace advice given to you by your health care provider. Make sure you discuss any questions you have with your health care  provider. Document Revised: 12/17/2019 Document Reviewed: 12/17/2019 Elsevier Patient Education  2023 [employer].

## 2022-05-05 DIAGNOSIS — M79645 Pain in left finger(s): Secondary | ICD-10-CM | POA: Diagnosis not present

## 2022-05-05 DIAGNOSIS — M25512 Pain in left shoulder: Secondary | ICD-10-CM | POA: Diagnosis not present

## 2022-05-15 DIAGNOSIS — H04123 Dry eye syndrome of bilateral lacrimal glands: Secondary | ICD-10-CM | POA: Diagnosis not present

## 2022-05-15 DIAGNOSIS — H524 Presbyopia: Secondary | ICD-10-CM | POA: Diagnosis not present

## 2022-05-15 DIAGNOSIS — H5051 Esophoria: Secondary | ICD-10-CM | POA: Diagnosis not present

## 2022-05-15 DIAGNOSIS — H5213 Myopia, bilateral: Secondary | ICD-10-CM | POA: Diagnosis not present

## 2022-05-15 DIAGNOSIS — H52223 Regular astigmatism, bilateral: Secondary | ICD-10-CM | POA: Diagnosis not present

## 2022-05-15 DIAGNOSIS — H16143 Punctate keratitis, bilateral: Secondary | ICD-10-CM | POA: Diagnosis not present

## 2022-05-19 DIAGNOSIS — M79645 Pain in left finger(s): Secondary | ICD-10-CM | POA: Diagnosis not present

## 2022-05-25 ENCOUNTER — Ambulatory Visit: Payer: Medicare PPO | Admitting: Neurology

## 2022-05-27 DIAGNOSIS — M542 Cervicalgia: Secondary | ICD-10-CM | POA: Diagnosis not present

## 2022-05-27 DIAGNOSIS — M47812 Spondylosis without myelopathy or radiculopathy, cervical region: Secondary | ICD-10-CM | POA: Diagnosis not present

## 2022-06-11 DIAGNOSIS — E7800 Pure hypercholesterolemia, unspecified: Secondary | ICD-10-CM | POA: Diagnosis not present

## 2022-06-11 DIAGNOSIS — E039 Hypothyroidism, unspecified: Secondary | ICD-10-CM | POA: Diagnosis not present

## 2022-06-11 DIAGNOSIS — I10 Essential (primary) hypertension: Secondary | ICD-10-CM | POA: Diagnosis not present

## 2022-06-11 DIAGNOSIS — R7989 Other specified abnormal findings of blood chemistry: Secondary | ICD-10-CM | POA: Diagnosis not present

## 2022-06-15 DIAGNOSIS — L570 Actinic keratosis: Secondary | ICD-10-CM | POA: Diagnosis not present

## 2022-06-15 DIAGNOSIS — D225 Melanocytic nevi of trunk: Secondary | ICD-10-CM | POA: Diagnosis not present

## 2022-06-15 DIAGNOSIS — L820 Inflamed seborrheic keratosis: Secondary | ICD-10-CM | POA: Diagnosis not present

## 2022-06-15 DIAGNOSIS — L821 Other seborrheic keratosis: Secondary | ICD-10-CM | POA: Diagnosis not present

## 2022-06-16 DIAGNOSIS — M79645 Pain in left finger(s): Secondary | ICD-10-CM | POA: Diagnosis not present

## 2022-06-16 DIAGNOSIS — I10 Essential (primary) hypertension: Secondary | ICD-10-CM | POA: Diagnosis not present

## 2022-06-16 DIAGNOSIS — R82998 Other abnormal findings in urine: Secondary | ICD-10-CM | POA: Diagnosis not present

## 2022-06-17 DIAGNOSIS — Z8673 Personal history of transient ischemic attack (TIA), and cerebral infarction without residual deficits: Secondary | ICD-10-CM | POA: Diagnosis not present

## 2022-06-17 DIAGNOSIS — Z1339 Encounter for screening examination for other mental health and behavioral disorders: Secondary | ICD-10-CM | POA: Diagnosis not present

## 2022-06-17 DIAGNOSIS — K219 Gastro-esophageal reflux disease without esophagitis: Secondary | ICD-10-CM | POA: Diagnosis not present

## 2022-06-17 DIAGNOSIS — Z0000 Encounter for general adult medical examination without abnormal findings: Secondary | ICD-10-CM | POA: Diagnosis not present

## 2022-06-17 DIAGNOSIS — E039 Hypothyroidism, unspecified: Secondary | ICD-10-CM | POA: Diagnosis not present

## 2022-06-17 DIAGNOSIS — F4323 Adjustment disorder with mixed anxiety and depressed mood: Secondary | ICD-10-CM | POA: Diagnosis not present

## 2022-06-17 DIAGNOSIS — I209 Angina pectoris, unspecified: Secondary | ICD-10-CM | POA: Diagnosis not present

## 2022-06-17 DIAGNOSIS — E7800 Pure hypercholesterolemia, unspecified: Secondary | ICD-10-CM | POA: Diagnosis not present

## 2022-06-17 DIAGNOSIS — I10 Essential (primary) hypertension: Secondary | ICD-10-CM | POA: Diagnosis not present

## 2022-06-17 DIAGNOSIS — Z1331 Encounter for screening for depression: Secondary | ICD-10-CM | POA: Diagnosis not present

## 2022-06-17 DIAGNOSIS — Z90710 Acquired absence of both cervix and uterus: Secondary | ICD-10-CM | POA: Diagnosis not present

## 2022-06-25 NOTE — Progress Notes (Unsigned)
Cardiology Office Note:   Date:  06/28/2022  NAME:  Tiffany Mayo    MRN: [id number] DOB:  Jul 18, 1940   PCP:  Haywood Pao, MD  Cardiologist:  None  Electrophysiologist:  None   Referring MD: Haywood Pao, MD   Chief Complaint  Patient presents with   Chest Pain   History of Present Illness:   Tiffany Mayo is a 82 y.o. female with a hx of TIA, hypertension, hyperlipidemia who is being seen today for the evaluation of chest pain at the request of Tisovec, Fransico Him, MD. she reports for the past 2 to 3 months she has had burning in her left chest.  Symptoms can occur with heavy activity.  She reports they can also occur with eating.  Her primary care physician is worried about possible acid reflux.  He would like to know that her heart is okay given association with activity.  She was admitted to the hospital in November with TIA.  Workup unremarkable.  She has never had a heart attack or stroke.  EKG demonstrates sinus rhythm with no acute ischemic changes.  CV exam normal.  Echo from November showed normal LV and RV function with no significant valvular heart disease.  She does have hypertension that is treated.  Her cholesterol is being treated by her primary care physician.  She is on aspirin.  She is married.  She has 2 children.  She has several grandchildren and 1 great grandchild.  She has never had a heart attack or stroke.  She reports her father had congestive heart failure.  Her husband has dementia.  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  Recent TSH unremarkable.  She does not smoke.  No alcohol.  No drug use.  She is a retired Pharmacist, hospital.  She [place].  She has relocated to [facility] to be closer to family.  She is not currently exercising.  She does a lot of pottery and yard work.  Denies any symptoms in office today.  Problem List TIA -03/2022 HTN HLD -T chol 203, HDL 44, LDL 138, TG 104  Past Medical History: Past Medical History:  Diagnosis Date    Arthritis    spine, neck   GERD (gastroesophageal reflux disease)    Headache    SINUS    Hemorrhoid    Hyperlipemia    Hypertension    Hypothyroidism     Past Surgical History: Past Surgical History:  Procedure Laterality Date   ABDOMINAL HYSTERECTOMY  1990   ANTERIOR CERVICAL DECOMP/DISCECTOMY FUSION  04/03/2012   Procedure: ANTERIOR CERVICAL DECOMPRESSION/DISCECTOMY FUSION 1 LEVEL;  Surgeon: Hosie Spangle, MD;  Location: [facility];  Service: Neurosurgery;  Laterality: N/A;  Cervical Five-Six Anterior Cervical Decompression with Fusion Plating and Bonegraft   BUNIONECTOMY     RIGHT FOOT   EYE SURGERY     Lasik, Catarct   GANGLION CYST EXCISION     BIL WRIST    JOINT REPLACEMENT     Right middle finger   TRIGGER FINGER RELEASE     thumbs    Current Medications: Current Meds  Medication Sig   aspirin EC 81 MG tablet Take 1 tablet (81 mg total) by mouth daily. Swallow whole.   atorvastatin (LIPITOR) 40 MG tablet Take 1 tablet (40 mg total) by mouth daily.   escitalopram (LEXAPRO) 5 MG tablet 5 mg daily.   esomeprazole (NEXIUM) 20 MG capsule Take 20 mg by mouth daily.   levothyroxine (  SYNTHROID) 50 MCG tablet Take 50 mcg by mouth every other day.   levothyroxine (SYNTHROID) 75 MCG tablet Take 75 mcg by mouth every other day.   metoprolol tartrate (LOPRESSOR) 25 MG tablet Take 1 tablet by mouth once for procedure.   olmesartan (BENICAR) 20 MG tablet Take 20 mg by mouth daily.   pantoprazole (PROTONIX) 20 MG tablet Take 20 mg by mouth daily.   sucralfate (CARAFATE) 1 g tablet Take 1 g by mouth 2 (two) times daily.     Allergies:    Latex, Codeine, and Sulfa antibiotics   Social History: Social History   Socioeconomic History   Marital status: Married    Spouse name: Not on file   Number of children: 2   Years of education: Not on file   Highest education level: Not on file  Occupational History   Occupation: Pharmacist, 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  Tobacco Use    Smoking status: Never   Smokeless tobacco: Never  Substance and Sexual Activity   Alcohol use: Yes    Comment: OCC WINE    Drug use: No   Sexual activity: Not on file  Other Topics Concern   Not on file  Social History Narrative   Not on file   Social Determinants of Health   Financial Resource Strain: Not on file  Food Insecurity: No Food Insecurity (04/08/2022)   Hunger Vital Sign    Worried About Running Out of Food in the Last Year: Never true    Ran Out of Food in the Last Year: Never true  Transportation Needs: No Transportation Needs (04/08/2022)   PRAPARE - Hydrologist (Medical): No    Lack of Transportation (Non-Medical): No  Physical Activity: Not on file  Stress: Not on file  Social Connections: Not on file     Family History: The patient's family history includes Heart disease in her father.  ROS:   All other ROS reviewed and negative. Pertinent positives noted in the HPI.     EKGs/Labs/Other Studies Reviewed:   The following studies were personally reviewed by me today:  EKG:  EKG is ordered today.  The ekg ordered today demonstrates normal sinus rhythm heart rate 65, no acute ischemic changes or evidence of infarction, and was personally reviewed by me.   TTE 04/08/2022  1. Left ventricular ejection fraction, by estimation, is 55 to 60%. The  left ventricle has normal function. The left ventricle has no regional  wall motion abnormalities. Left ventricular diastolic parameters were  normal.   2. Right ventricular systolic function is normal. The right ventricular  size is normal. There is normal pulmonary artery systolic pressure.   3. The mitral valve is abnormal. No evidence of mitral valve  regurgitation. No evidence of mitral stenosis.   4. The aortic valve is tricuspid. There is moderate calcification of the  aortic valve. There is moderate thickening of the aortic valve. Aortic  valve regurgitation is mild. Aortic valve  sclerosis is present, with no  evidence of aortic valve stenosis.   5. The inferior vena cava is normal in size with greater than 50%  respiratory variability, suggesting right atrial pressure of 3 mmHg.    Recent Labs: 04/07/2022: ALT 14; BUN 20; Creatinine, Ser 0.93; Hemoglobin 13.4; Platelets 255; Potassium 4.0; Sodium 139 04/08/2022: TSH 4.968   Recent Lipid Panel    Component Value Date/Time   CHOL 203 (H) 04/08/2022 0000   TRIG 104 04/08/2022 0000  HDL 44 04/08/2022 0000   CHOLHDL 4.6 04/08/2022 0000   VLDL 21 04/08/2022 0000   LDLCALC 138 (H) 04/08/2022 0000    Physical Exam:   VS:  BP (!) 110/58 (BP Location: Left Arm, Patient Position: Sitting, Cuff Size: Normal)   Pulse 65   Ht 5\' 1"  (1.549 m)   Wt 140 lb 9.6 oz (63.8 kg)   SpO2 97%   BMI 26.57 kg/m    Wt Readings from Last 3 Encounters:  06/28/22 140 lb 9.6 oz (63.8 kg)  04/27/22 141 lb (64 kg)  04/07/22 139 lb (63 kg)    General: Well nourished, well developed, in no acute distress Head: Atraumatic, normal size  Eyes: PEERLA, EOMI  Neck: Supple, no JVD Endocrine: No thryomegaly Cardiac: Normal S1, S2; RRR; no murmurs, rubs, or gallops Lungs: Clear to auscultation bilaterally, no wheezing, rhonchi or rales  Abd: Soft, nontender, no hepatomegaly  Ext: No edema, pulses 2+ Musculoskeletal: No deformities, BUE and BLE strength normal and equal Skin: Warm and dry, no rashes   Neuro: Alert and oriented to person, place, time, and situation, CNII-XII grossly intact, no focal deficits  Psych: Normal mood and affect   ASSESSMENT:   Tiffany Mayo is a 82 y.o. female who presents for the following: 1. Precordial pain     PLAN:   1. Precordial pain -Burning sensation in her chest with activity.  Symptoms also occur with eating.  Could represent acid reflux.  CV risk factors include hypertension, TIA, hyperlipidemia and inactivity.  Also age.  To exclude CAD will proceed with coronary CTA.  She will need a BMP  today.  She will take 25 mg metoprolol tartrate 2 hours before the scan.  She will see me back as needed based on the results of the scan.  I do agree with continuing aspirin as well as Lipitor given history of TIA.  We will guide her further based on the results of her scan.  Recent echo was normal which is reassuring.  CV exam also normal. -If cardiac workup is negative would recommend intensification of acid reflux medications.   Disposition: Return if symptoms worsen or fail to improve.  Medication Adjustments/Labs and Tests Ordered: Current medicines are reviewed at length with the patient today.  Concerns regarding medicines are outlined above.  Orders Placed This Encounter  Procedures   CT CORONARY MORPH W/CTA COR W/SCORE W/CA W/CM &/OR WO/CM   Basic metabolic panel   EKG 12-Lead   Meds ordered this encounter  Medications   metoprolol tartrate (LOPRESSOR) 25 MG tablet    Sig: Take 1 tablet by mouth once for procedure.    Dispense:  1 tablet    Refill:  0    Patient Instructions  Medication Instructions:  Take Metoprolol 25 mg two hours before CT when scheduled.   *If you need a refill on your cardiac medications before your next appointment, please call your pharmacy*   Lab Work: BMET today   If you have labs (blood work) drawn today and your tests are completely normal, you will receive your results only by: MyChart Message (if you have MyChart) OR A paper copy in the mail If you have any lab test that is abnormal or we need to change your treatment, we will call you to review the results.   Testing/Procedures: Coronary CTA- they will call you to schedule this.   Follow-Up: At [facility], you and your health needs are our priority.  As part  of our continuing mission to provide you with exceptional heart care, we have created designated Provider Care Teams.  These Care Teams include your primary Cardiologist (physician) and Advanced Practice Providers (APPs  -  Physician Assistants and Nurse Practitioners) who all work together to provide you with the care you need, when you need it.  We recommend signing up for the patient portal called "MyChart".  Sign up information is provided on this After Visit Summary.  MyChart is used to connect with patients for Virtual Visits (Telemedicine).  Patients are able to view lab/test results, encounter notes, upcoming appointments, etc.  Non-urgent messages can be sent to your provider as well.   To learn more about what you can do with MyChart, go to [email or web address].    Your next appointment:   As needed  Provider:   Lennie Odor, MD      Signed, Lenna Gilford. Flora Lipps, MD, [facility]  [facility]  [address] [place], [place] [address] [phone]  06/28/2022 11:34 AM

## 2022-06-28 ENCOUNTER — Ambulatory Visit: Payer: Medicare PPO | Attending: Cardiovascular Disease | Admitting: Cardiovascular Disease

## 2022-06-28 ENCOUNTER — Encounter: Payer: Self-pay | Admitting: Cardiovascular Disease

## 2022-06-28 VITALS — BP 110/58 | HR 65 | Ht 61.0 in | Wt 140.6 lb

## 2022-06-28 DIAGNOSIS — R072 Precordial pain: Secondary | ICD-10-CM | POA: Diagnosis not present

## 2022-06-28 MED ORDER — METOPROLOL TARTRATE 25 MG PO TABS: ORAL_TABLET | ORAL | 0 refills | Status: DC

## 2022-06-28 NOTE — Patient Instructions (Signed)
Medication Instructions:  Take Metoprolol 25 mg two hours before CT when scheduled.   *If you need a refill on your cardiac medications before your next appointment, please call your pharmacy*   Lab Work: BMET today   If you have labs (blood work) drawn today and your tests are completely normal, you will receive your results only by: [place] (if you have MyChart) OR A paper copy in the mail If you have any lab test that is abnormal or we need to change your treatment, we will call you to review the results.   Testing/Procedures: Coronary CTA- they will call you to schedule this.   Follow-Up: At [facility], you and your health needs are our priority.  As part of our continuing mission to provide you with exceptional heart care, we have created designated Provider Care Teams.  These Care Teams include your primary Cardiologist (physician) and Advanced Practice Providers (APPs -  Physician Assistants and Nurse Practitioners) who all work together to provide you with the care you need, when you need it.  We recommend signing up for the patient portal called "MyChart".  Sign up information is provided on this After Visit Summary.  MyChart is used to connect with patients for Virtual Visits (Telemedicine).  Patients are able to view lab/test results, encounter notes, upcoming appointments, etc.  Non-urgent messages can be sent to your provider as well.   To learn more about what you can do with MyChart, go to [email or web address].    Your next appointment:   As needed  Provider:   Eleonore Chiquito, MD

## 2022-06-29 LAB — BASIC METABOLIC PANEL
BUN/Creatinine Ratio: 22 (ref 12–28)
BUN: 24 mg/dL (ref 8–27)
CO2: 23 mmol/L (ref 20–29)
Calcium: 9.3 mg/dL (ref 8.7–10.3)
Chloride: 102 mmol/L (ref 96–106)
Creatinine, Ser: 1.09 mg/dL — ABNORMAL HIGH (ref 0.57–1.00)
Glucose: 94 mg/dL (ref 70–99)
Potassium: 4.5 mmol/L (ref 3.5–5.2)
Sodium: 140 mmol/L (ref 134–144)
eGFR: 51 mL/min/{1.73_m2} — ABNORMAL LOW (ref >59)

## 2022-06-30 ENCOUNTER — Telehealth (HOSPITAL_COMMUNITY): Payer: Self-pay | Admitting: *Deleted

## 2022-06-30 NOTE — Telephone Encounter (Signed)
Reaching out to patient to offer assistance regarding upcoming cardiac imaging study; pt verbalizes understanding of appt date/time, parking situation and where to check in, pre-test NPO status and medications ordered, and verified current allergies; name and call back number provided for further questions should they arise  Gordy Clement RN Navigator Cardiac Imaging [facility] [phone] office [phone] cell  Patient to take 25mg  metoprolol tartrate two hours prior to her cardiac CT scan. She is aware to arrive at [facility].

## 2022-07-01 ENCOUNTER — Other Ambulatory Visit: Payer: Self-pay | Admitting: Cardiovascular Disease

## 2022-07-01 ENCOUNTER — Ambulatory Visit (HOSPITAL_COMMUNITY)
Admission: RE | Admit: 2022-07-01 | Discharge: 2022-07-01 | Disposition: A | Discharge status: Home / Self Care | Payer: Medicare PPO | Source: Ambulatory Visit | Attending: Cardiovascular Disease | Admitting: Cardiovascular Disease

## 2022-07-01 DIAGNOSIS — I700 Atherosclerosis of aorta: Secondary | ICD-10-CM | POA: Insufficient documentation

## 2022-07-01 DIAGNOSIS — J9811 Atelectasis: Secondary | ICD-10-CM | POA: Diagnosis not present

## 2022-07-01 DIAGNOSIS — R072 Precordial pain: Secondary | ICD-10-CM

## 2022-07-01 DIAGNOSIS — I358 Other nonrheumatic aortic valve disorders: Secondary | ICD-10-CM | POA: Diagnosis not present

## 2022-07-01 DIAGNOSIS — R079 Chest pain, unspecified: Secondary | ICD-10-CM | POA: Insufficient documentation

## 2022-07-01 DIAGNOSIS — I2510 Atherosclerotic heart disease of native coronary artery without angina pectoris: Secondary | ICD-10-CM

## 2022-07-01 DIAGNOSIS — R931 Abnormal findings on diagnostic imaging of heart and coronary circulation: Secondary | ICD-10-CM | POA: Diagnosis not present

## 2022-07-01 MED ORDER — IOHEXOL 350 MG/ML SOLN
100.0000 mL | Freq: Once | INTRAVENOUS | Status: AC | PRN
  Administered 2022-07-01: 100 mL via INTRAVENOUS

## 2022-07-01 MED ORDER — NITROGLYCERIN 0.4 MG SL SUBL: 0.8000 mg | SUBLINGUAL_TABLET | Freq: Once | SUBLINGUAL | Status: AC

## 2022-07-01 MED ORDER — NITROGLYCERIN 0.4 MG SL SUBL
SUBLINGUAL_TABLET | SUBLINGUAL | Status: AC
  Administered 2022-07-01: 0.8 mg via SUBLINGUAL
  Filled 2022-07-01: qty 2

## 2022-07-02 ENCOUNTER — Telehealth: Payer: Self-pay | Admitting: Cardiovascular Disease

## 2022-07-02 ENCOUNTER — Ambulatory Visit (HOSPITAL_BASED_OUTPATIENT_CLINIC_OR_DEPARTMENT_OTHER)
Admission: RE | Admit: 2022-07-02 | Discharge: 2022-07-02 | Disposition: A | Discharge status: Home / Self Care | Payer: Medicare PPO | Source: Ambulatory Visit | Attending: Cardiovascular Disease | Admitting: Cardiovascular Disease

## 2022-07-02 DIAGNOSIS — I2510 Atherosclerotic heart disease of native coronary artery without angina pectoris: Secondary | ICD-10-CM | POA: Diagnosis not present

## 2022-07-02 DIAGNOSIS — R931 Abnormal findings on diagnostic imaging of heart and coronary circulation: Secondary | ICD-10-CM | POA: Diagnosis not present

## 2022-07-02 DIAGNOSIS — R072 Precordial pain: Secondary | ICD-10-CM | POA: Diagnosis not present

## 2022-07-02 DIAGNOSIS — I358 Other nonrheumatic aortic valve disorders: Secondary | ICD-10-CM | POA: Diagnosis not present

## 2022-07-02 DIAGNOSIS — J9811 Atelectasis: Secondary | ICD-10-CM | POA: Diagnosis not present

## 2022-07-02 DIAGNOSIS — R079 Chest pain, unspecified: Secondary | ICD-10-CM | POA: Diagnosis not present

## 2022-07-02 DIAGNOSIS — I700 Atherosclerosis of aorta: Secondary | ICD-10-CM | POA: Diagnosis not present

## 2022-07-02 MED ORDER — METOPROLOL TARTRATE 25 MG PO TABS: 25.0000 mg | ORAL_TABLET | Freq: Two times a day (BID) | ORAL | 3 refills | Status: DC

## 2022-07-02 MED ORDER — NITROGLYCERIN 0.4 MG SL SUBL: 0.4000 mg | SUBLINGUAL_TABLET | SUBLINGUAL | 3 refills | Status: DC | PRN

## 2022-07-02 NOTE — Telephone Encounter (Signed)
Called Ms. Tiffany Mayo to discuss results of coronary CTA.  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  He was having chest discomfort with burning when doing heavy activity.  Symptoms are now happening at rest.  She was planned to go to [place] for 2 weeks.  We discussed that we should likely delay this.  I do not want her to end up having a heart attack in [place].  She will need left heart catheterization with likely PCI.  She will see me on Monday morning at 8 AM.  I will call in metoprolol tartrate 25 mg twice daily.  She is on aspirin.  She is on high intensity statin.  She will be given a prescription for sublingual nitroglycerin.  She was given strict return precautions.  She was given instructions on this medication.'  She was in agreement with this plan.  [place] B[place] T. Audie Box, MD, [place]  [address] [place], [place] [address] [phone]  8:20 AM

## 2022-07-02 NOTE — Telephone Encounter (Signed)
Spoke with patient of Dr. Audie Box - she needs a letter ASAP. She is supposed to fly out today.   She also said MD told her he would see her Monday at [place]. This is not yet scheduled.   Advised will send to primary nurse

## 2022-07-02 NOTE — Telephone Encounter (Signed)
Letter sent via mychart- patient made aware.  Follow up on Monday, 02/05, scheduled.   Thanks!

## 2022-07-02 NOTE — Telephone Encounter (Signed)
Called patient, scheduled for visit on 07/05/2022.  Patient letter for her flight was also typed up and sent via mychart, patient made aware of this.   Verbalized understanding.  Advised her to call back if other questions/concerns.

## 2022-07-02 NOTE — Telephone Encounter (Signed)
Pot would like a callback in regards to a letter for flight as well as discuss appt. Please advise

## 2022-07-02 NOTE — Telephone Encounter (Signed)
Patient called back to say she needs a letter to give to the airlines, so that they can cancel her flight. Please advise

## 2022-07-04 NOTE — H&P (View-Only) (Signed)
Cardiology Office Note:   Date:  07/05/2022  NAME:  Tiffany Mayo    MRN: [id number] DOB:  Dec 18, 1940   PCP:  Haywood Pao, MD  Cardiologist:  None  Electrophysiologist:  None   Referring MD: Haywood Pao, MD   Chief Complaint  Patient presents with   Chest Pain    History of Present Illness:   Tiffany Mayo is a 82 y.o. female with a hx of CAD who presents for follow-up. CCTA with 2-vessel obstructive CAD.  Still having burning in her chest.  Symptoms are occurring more frequently.  They do respond to nitroglycerin.  She reports no jaw pain.  Her EKG is normal.  We discussed her coronary CTA which shows two-vessel CAD and an occluded second diagonal branch.  I have recommended coronary angiography for further evaluation.  I suspect she will need the proximal LAD intervened upon as well as the circumflex.  Lesions are tight.  CT FFR is positive.  EKG shows sinus bradycardia.  A little bit of fatigue today.  She is on metoprolol.  She is on aspirin.  She is on high intensity statin.  She presents with her son.  We will set her up for invasive angiography later this week.  Problem List CAD -severe pLAD 70-99% CT FFR 0.67 -severe LCX 70-99% CT FFR 0.69 -CAC 544 (80th percentile) 2. HLD  -T chol 203, HDL 44, LDL 138, TG 104 3. TIA  Past Medical History: Past Medical History:  Diagnosis Date   Arthritis    spine, neck   GERD (gastroesophageal reflux disease)    Headache    SINUS    Hemorrhoid    Hyperlipemia    Hypertension    Hypothyroidism     Past Surgical History: Past Surgical History:  Procedure Laterality Date   ABDOMINAL HYSTERECTOMY  1990   ANTERIOR CERVICAL DECOMP/DISCECTOMY FUSION  04/03/2012   Procedure: ANTERIOR CERVICAL DECOMPRESSION/DISCECTOMY FUSION 1 LEVEL;  Surgeon: Hosie Spangle, MD;  Location: [facility];  Service: Neurosurgery;  Laterality: N/A;  Cervical Five-Six Anterior Cervical Decompression with Fusion Plating and Bonegraft    BUNIONECTOMY     RIGHT FOOT   EYE SURGERY     Lasik, Catarct   GANGLION CYST EXCISION     BIL WRIST    JOINT REPLACEMENT     Right middle finger   TRIGGER FINGER RELEASE     thumbs    Current Medications: Current Meds  Medication Sig   aspirin EC 81 MG tablet Take 1 tablet (81 mg total) by mouth daily. Swallow whole.   atorvastatin (LIPITOR) 40 MG tablet Take 1 tablet (40 mg total) by mouth daily.   escitalopram (LEXAPRO) 5 MG tablet 5 mg daily.   levothyroxine (SYNTHROID) 50 MCG tablet Take 50 mcg by mouth every other day.   levothyroxine (SYNTHROID) 75 MCG tablet Take 75 mcg by mouth every other day.   metoprolol tartrate (LOPRESSOR) 25 MG tablet Take 1 tablet (25 mg total) by mouth 2 (two) times daily.   nitroGLYCERIN (NITROSTAT) 0.4 MG SL tablet Place 1 tablet (0.4 mg total) under the tongue every 5 (five) minutes as needed for chest pain.   olmesartan (BENICAR) 20 MG tablet Take 20 mg by mouth daily.   pantoprazole (PROTONIX) 20 MG tablet Take 20 mg by mouth daily.     Allergies:    Latex, Codeine, and Sulfa antibiotics   Social History: Social History   Socioeconomic History   Marital status: Married  Spouse name: Not on file   Number of children: 2   Years of education: Not on file   Highest education level: Not on file  Occupational History   Occupation: Teacher First Grade - Retired  Tobacco Use   Smoking status: Never   Smokeless tobacco: Never  Substance and Sexual Activity   Alcohol use: Yes    Comment: OCC WINE    Drug use: No   Sexual activity: Not on file  Other Topics Concern   Not on file  Social History Narrative   Not on file   Social Determinants of Health   Financial Resource Strain: Not on file  Food Insecurity: No Food Insecurity (04/08/2022)   Hunger Vital Sign    Worried About Running Out of Food in the Last Year: Never true    Ran Out of Food in the Last Year: Never true  Transportation Needs: No Transportation Needs (04/08/2022)    PRAPARE - Hydrologist (Medical): No    Lack of Transportation (Non-Medical): No  Physical Activity: Not on file  Stress: Not on file  Social Connections: Not on file     Family History: The patient's family history includes Heart disease in her father.  ROS:   All other ROS reviewed and negative. Pertinent positives noted in the HPI.     EKGs/Labs/Other Studies Reviewed:   The following studies were personally reviewed by me today:  EKG:  EKG is  ordered today.  The ekg ordered today demonstrates sinus bradycardia heart rate 52, no acute ischemic changes or evidence of infarction, and was personally reviewed by me.   TTE 04/08/2022  1. Left ventricular ejection fraction, by estimation, is 55 to 60%. The  left ventricle has normal function. The left ventricle has no regional  wall motion abnormalities. Left ventricular diastolic parameters were  normal.   2. Right ventricular systolic function is normal. The right ventricular  size is normal. There is normal pulmonary artery systolic pressure.   3. The mitral valve is abnormal. No evidence of mitral valve  regurgitation. No evidence of mitral stenosis.   4. The aortic valve is tricuspid. There is moderate calcification of the  aortic valve. There is moderate thickening of the aortic valve. Aortic  valve regurgitation is mild. Aortic valve sclerosis is present, with no  evidence of aortic valve stenosis.   5. The inferior vena cava is normal in size with greater than 50%  respiratory variability, suggesting right atrial pressure of 3 mmHg.   Recent Labs: 04/07/2022: ALT 14; Hemoglobin 13.4; Platelets 255 04/08/2022: TSH 4.968 06/28/2022: BUN 24; Creatinine, Ser 1.09; Potassium 4.5; Sodium 140   Recent Lipid Panel    Component Value Date/Time   CHOL 203 (H) 04/08/2022 0000   TRIG 104 04/08/2022 0000   HDL 44 04/08/2022 0000   CHOLHDL 4.6 04/08/2022 0000   VLDL 21 04/08/2022 0000   LDLCALC 138  (H) 04/08/2022 0000    Physical Exam:   VS:  BP 128/62   Pulse (!) 52   Ht 5\' 1"  (1.549 m)   Wt 140 lb 12.8 oz (63.9 kg)   BMI 26.60 kg/m    Wt Readings from Last 3 Encounters:  07/05/22 140 lb 12.8 oz (63.9 kg)  06/28/22 140 lb 9.6 oz (63.8 kg)  04/27/22 141 lb (64 kg)    General: Well nourished, well developed, in no acute distress Head: Atraumatic, normal size  Eyes: PEERLA, EOMI  Neck: Supple, no JVD  Endocrine: No thryomegaly Cardiac: Normal S1, S2; RRR; no murmurs, rubs, or gallops Lungs: Clear to auscultation bilaterally, no wheezing, rhonchi or rales  Abd: Soft, nontender, no hepatomegaly  Ext: No edema, pulses 2+ Musculoskeletal: No deformities, BUE and BLE strength normal and equal Skin: Warm and dry, no rashes   Neuro: Alert and oriented to person, place, time, and situation, CNII-XII grossly intact, no focal deficits  Psych: Normal mood and affect   ASSESSMENT:   Tiffany Mayo is a 82 y.o. female who presents for the following: 1. Coronary artery disease involving native coronary artery of native heart with unstable angina pectoris ([place])   2. Mixed hyperlipidemia     PLAN:   1. Coronary artery disease involving native coronary artery of native heart with unstable angina pectoris ([place]) 2. Mixed hyperlipidemia -Two-vessel CAD on coronary CTA.  Proximal LAD is involved.  Also with an occluded diagonal branch on my review of the data set.  She continues to have burning in her chest.  Symptoms occur at rest.  I believe her symptoms are becoming unstable.  EKG is nonischemic today.  She will continue aspirin 81 mg daily, Lipitor 80 mg daily, metoprolol tartrate 25 mg twice daily.  She reports some dizziness and lightheadedness.  Suspect she will not tolerate metoprolol long-term.  We will proceed with invasive angiography and percutaneous coronary intervention.  We will set her up for later this week.  She will need CBC and BMP today.  She will then see me back in 1 to 2  weeks to discuss further.  She has an echo from November.  We will discuss further risk reduction strategies when I see her back.    Shared Decision Making/Informed Consent The risks [stroke (1 in 1000), death (1 in 1000), kidney failure [usually temporary] (1 in 500), bleeding (1 in 200), allergic reaction [possibly serious] (1 in 200)], benefits (diagnostic support and management of coronary artery disease) and alternatives of a cardiac catheterization were discussed in detail with Tiffany Mayo and she is willing to proceed.  Disposition: Return in about 1 week (around 07/12/2022).  Medication Adjustments/Labs and Tests Ordered: Current medicines are reviewed at length with the patient today.  Concerns regarding medicines are outlined above.  Orders Placed This Encounter  Procedures   Basic metabolic panel   CBC   EKG 12-Lead   No orders of the defined types were placed in this encounter.   Patient Instructions  Medication Instructions:  The current medical regimen is effective;  continue present plan and medications.  *If you need a refill on your cardiac medications before your next appointment, please call your pharmacy*   Lab Work: CBC, BMET today   If you have labs (blood work) drawn today and your tests are completely normal, you will receive your results only by: [place] (if you have MyChart) OR A paper copy in the mail If you have any lab test that is abnormal or we need to change your treatment, we will call you to review the results.   Testing/Procedures:  Your physician has requested that you have a cardiac catheterization. Cardiac catheterization is used to diagnose and/or treat various heart conditions. Doctors may recommend this procedure for a number of different reasons. The most common reason is to evaluate chest pain. Chest pain can be a symptom of coronary artery disease (CAD), and cardiac catheterization can show whether plaque is narrowing or blocking  your heart's arteries. This procedure is also used to evaluate the  valves, as well as measure the blood flow and oxygen levels in different parts of your heart. For further information please visit [email or web address]. Please follow instruction sheet, as given.   Follow-Up: At [facility], you and your health needs are our priority.  As part of our continuing mission to provide you with exceptional heart care, we have created designated Provider Care Teams.  These Care Teams include your primary Cardiologist (physician) and Advanced Practice Providers (APPs -  Physician Assistants and Nurse Practitioners) who all work together to provide you with the care you need, when you need it.  We recommend signing up for the patient portal called "MyChart".  Sign up information is provided on this After Visit Summary.  MyChart is used to connect with patients for Virtual Visits (Telemedicine).  Patients are able to view lab/test results, encounter notes, upcoming appointments, etc.  Non-urgent messages can be sent to your provider as well.   To learn more about what you can do with MyChart, go to [email or web address].    Your next appointment:   07/19/2022 @ 9:00 AM  Provider:   Eleonore Chiquito, MD   Other Instructions  [place] A DEPT OF [place] [id number] [place] [address] Dept: [phone] Loc: [phone]  Tiffany Mayo  07/05/2022  You are scheduled for a Cardiac Catheterization on Thursday, February 8 with Dr. Lauree Chandler.  1. Please arrive at the [facility] (Main Entrance A) at [facility]: [address] [place], [place] [address] at 11:30 AM (This time is two hours before your procedure to ensure your preparation). Free valet parking service is available.   Special note: Every effort is made to have your procedure done on time. Please understand  that emergencies sometimes delay scheduled procedures.  2. Diet: Do not eat solid foods after midnight.  The patient may have clear liquids until 5am upon the day of the procedure.  3. Labs: You will need to have blood drawn today- CBC, BMET. You do not need to be fasting.  4. Medication instructions in preparation for your procedure:   Contrast Allergy: No  Stop taking, Benicar (Olmesartan) Wednesday, February 7,   On the morning of your procedure, take your Aspirin 81 mg and any morning medicines NOT listed above.  You may use sips of water.  5. Plan for one night stay--bring personal belongings. 6. Bring a current list of your medications and current insurance cards. 7. You MUST have a responsible person to drive you home. 8. Someone MUST be with you the first 24 hours after you arrive home or your discharge will be delayed. 9. Please wear clothes that are easy to get on and off and wear slip-on shoes.  Thank you for allowing [place] to care for you!   -- [facility]      Time Spent with Patient: I have spent a total of 35 minutes with patient reviewing hospital notes, telemetry, EKGs, labs and examining the patient as well as establishing an assessment and plan that was discussed with the patient.  > 50% of time was spent in direct patient care.  Signed, Addison Naegeli. Audie Box, MD, [place]  [address] [place], [place] [address] [phone]  07/05/2022 8:47 AM

## 2022-07-04 NOTE — Progress Notes (Unsigned)
Cardiology Office Note:   Date:  07/05/2022  NAME:  Tiffany Mayo    MRN: [id number] DOB:  24-May-1941   PCP:  Haywood Pao, MD  Cardiologist:  None  Electrophysiologist:  None   Referring MD: Haywood Pao, MD   Chief Complaint  Patient presents with   Chest Pain    History of Present Illness:   Tiffany Mayo is a 82 y.o. female with a hx of CAD who presents for follow-up. CCTA with 2-vessel obstructive CAD.  Still having burning in her chest.  Symptoms are occurring more frequently.  They do respond to nitroglycerin.  She reports no jaw pain.  Her EKG is normal.  We discussed her coronary CTA which shows two-vessel CAD and an occluded second diagonal branch.  I have recommended coronary angiography for further evaluation.  I suspect she will need the proximal LAD intervened upon as well as the circumflex.  Lesions are tight.  CT FFR is positive.  EKG shows sinus bradycardia.  A little bit of fatigue today.  She is on metoprolol.  She is on aspirin.  She is on high intensity statin.  She presents with her son.  We will set her up for invasive angiography later this week.  Problem List CAD -severe pLAD 70-99% CT FFR 0.67 -severe LCX 70-99% CT FFR 0.69 -CAC 544 (80th percentile) 2. HLD  -T chol 203, HDL 44, LDL 138, TG 104 3. TIA  Past Medical History: Past Medical History:  Diagnosis Date   Arthritis    spine, neck   GERD (gastroesophageal reflux disease)    Headache    SINUS    Hemorrhoid    Hyperlipemia    Hypertension    Hypothyroidism     Past Surgical History: Past Surgical History:  Procedure Laterality Date   ABDOMINAL HYSTERECTOMY  1990   ANTERIOR CERVICAL DECOMP/DISCECTOMY FUSION  04/03/2012   Procedure: ANTERIOR CERVICAL DECOMPRESSION/DISCECTOMY FUSION 1 LEVEL;  Surgeon: Hosie Spangle, MD;  Location: [facility];  Service: Neurosurgery;  Laterality: N/A;  Cervical Five-Six Anterior Cervical Decompression with Fusion Plating and Bonegraft    BUNIONECTOMY     RIGHT FOOT   EYE SURGERY     Lasik, Catarct   GANGLION CYST EXCISION     BIL WRIST    JOINT REPLACEMENT     Right middle finger   TRIGGER FINGER RELEASE     thumbs    Current Medications: Current Meds  Medication Sig   aspirin EC 81 MG tablet Take 1 tablet (81 mg total) by mouth daily. Swallow whole.   atorvastatin (LIPITOR) 40 MG tablet Take 1 tablet (40 mg total) by mouth daily.   escitalopram (LEXAPRO) 5 MG tablet 5 mg daily.   levothyroxine (SYNTHROID) 50 MCG tablet Take 50 mcg by mouth every other day.   levothyroxine (SYNTHROID) 75 MCG tablet Take 75 mcg by mouth every other day.   metoprolol tartrate (LOPRESSOR) 25 MG tablet Take 1 tablet (25 mg total) by mouth 2 (two) times daily.   nitroGLYCERIN (NITROSTAT) 0.4 MG SL tablet Place 1 tablet (0.4 mg total) under the tongue every 5 (five) minutes as needed for chest pain.   olmesartan (BENICAR) 20 MG tablet Take 20 mg by mouth daily.   pantoprazole (PROTONIX) 20 MG tablet Take 20 mg by mouth daily.     Allergies:    Latex, Codeine, and Sulfa antibiotics   Social History: Social History   Socioeconomic History   Marital status: Married  Spouse name: Not on file   Number of children: 2   Years of education: Not on file   Highest education level: Not on file  Occupational History   Occupation: Teacher First Grade - Retired  Tobacco Use   Smoking status: Never   Smokeless tobacco: Never  Substance and Sexual Activity   Alcohol use: Yes    Comment: OCC WINE    Drug use: No   Sexual activity: Not on file  Other Topics Concern   Not on file  Social History Narrative   Not on file   Social Determinants of Health   Financial Resource Strain: Not on file  Food Insecurity: No Food Insecurity (04/08/2022)   Hunger Vital Sign    Worried About Running Out of Food in the Last Year: Never true    Ran Out of Food in the Last Year: Never true  Transportation Needs: No Transportation Needs (04/08/2022)    PRAPARE - Hydrologist (Medical): No    Lack of Transportation (Non-Medical): No  Physical Activity: Not on file  Stress: Not on file  Social Connections: Not on file     Family History: The patient's family history includes Heart disease in her father.  ROS:   All other ROS reviewed and negative. Pertinent positives noted in the HPI.     EKGs/Labs/Other Studies Reviewed:   The following studies were personally reviewed by me today:  EKG:  EKG is  ordered today.  The ekg ordered today demonstrates sinus bradycardia heart rate 52, no acute ischemic changes or evidence of infarction, and was personally reviewed by me.   TTE 04/08/2022  1. Left ventricular ejection fraction, by estimation, is 55 to 60%. The  left ventricle has normal function. The left ventricle has no regional  wall motion abnormalities. Left ventricular diastolic parameters were  normal.   2. Right ventricular systolic function is normal. The right ventricular  size is normal. There is normal pulmonary artery systolic pressure.   3. The mitral valve is abnormal. No evidence of mitral valve  regurgitation. No evidence of mitral stenosis.   4. The aortic valve is tricuspid. There is moderate calcification of the  aortic valve. There is moderate thickening of the aortic valve. Aortic  valve regurgitation is mild. Aortic valve sclerosis is present, with no  evidence of aortic valve stenosis.   5. The inferior vena cava is normal in size with greater than 50%  respiratory variability, suggesting right atrial pressure of 3 mmHg.   Recent Labs: 04/07/2022: ALT 14; Hemoglobin 13.4; Platelets 255 04/08/2022: TSH 4.968 06/28/2022: BUN 24; Creatinine, Ser 1.09; Potassium 4.5; Sodium 140   Recent Lipid Panel    Component Value Date/Time   CHOL 203 (H) 04/08/2022 0000   TRIG 104 04/08/2022 0000   HDL 44 04/08/2022 0000   CHOLHDL 4.6 04/08/2022 0000   VLDL 21 04/08/2022 0000   LDLCALC 138  (H) 04/08/2022 0000    Physical Exam:   VS:  BP 128/62   Pulse (!) 52   Ht 5\' 1"  (1.549 m)   Wt 140 lb 12.8 oz (63.9 kg)   BMI 26.60 kg/m    Wt Readings from Last 3 Encounters:  07/05/22 140 lb 12.8 oz (63.9 kg)  06/28/22 140 lb 9.6 oz (63.8 kg)  04/27/22 141 lb (64 kg)    General: Well nourished, well developed, in no acute distress Head: Atraumatic, normal size  Eyes: PEERLA, EOMI  Neck: Supple, no JVD  Endocrine: No thryomegaly Cardiac: Normal S1, S2; RRR; no murmurs, rubs, or gallops Lungs: Clear to auscultation bilaterally, no wheezing, rhonchi or rales  Abd: Soft, nontender, no hepatomegaly  Ext: No edema, pulses 2+ Musculoskeletal: No deformities, BUE and BLE strength normal and equal Skin: Warm and dry, no rashes   Neuro: Alert and oriented to person, place, time, and situation, CNII-XII grossly intact, no focal deficits  Psych: Normal mood and affect   ASSESSMENT:   Tiffany Mayo is a 82 y.o. female who presents for the following: 1. Coronary artery disease involving native coronary artery of native heart with unstable angina pectoris ([place])   2. Mixed hyperlipidemia     PLAN:   1. Coronary artery disease involving native coronary artery of native heart with unstable angina pectoris ([place]) 2. Mixed hyperlipidemia -Two-vessel CAD on coronary CTA.  Proximal LAD is involved.  Also with an occluded diagonal branch on my review of the data set.  She continues to have burning in her chest.  Symptoms occur at rest.  I believe her symptoms are becoming unstable.  EKG is nonischemic today.  She will continue aspirin 81 mg daily, Lipitor 80 mg daily, metoprolol tartrate 25 mg twice daily.  She reports some dizziness and lightheadedness.  Suspect she will not tolerate metoprolol long-term.  We will proceed with invasive angiography and percutaneous coronary intervention.  We will set her up for later this week.  She will need CBC and BMP today.  She will then see me back in 1 to 2  weeks to discuss further.  She has an echo from November.  We will discuss further risk reduction strategies when I see her back.    Shared Decision Making/Informed Consent The risks [stroke (1 in 1000), death (1 in 1000), kidney failure [usually temporary] (1 in 500), bleeding (1 in 200), allergic reaction [possibly serious] (1 in 200)], benefits (diagnostic support and management of coronary artery disease) and alternatives of a cardiac catheterization were discussed in detail with Tiffany Mayo and she is willing to proceed.  Disposition: Return in about 1 week (around 07/12/2022).  Medication Adjustments/Labs and Tests Ordered: Current medicines are reviewed at length with the patient today.  Concerns regarding medicines are outlined above.  Orders Placed This Encounter  Procedures   Basic metabolic panel   CBC   EKG 12-Lead   No orders of the defined types were placed in this encounter.   Patient Instructions  Medication Instructions:  The current medical regimen is effective;  continue present plan and medications.  *If you need a refill on your cardiac medications before your next appointment, please call your pharmacy*   Lab Work: CBC, BMET today   If you have labs (blood work) drawn today and your tests are completely normal, you will receive your results only by: [place] (if you have MyChart) OR A paper copy in the mail If you have any lab test that is abnormal or we need to change your treatment, we will call you to review the results.   Testing/Procedures:  Your physician has requested that you have a cardiac catheterization. Cardiac catheterization is used to diagnose and/or treat various heart conditions. Doctors may recommend this procedure for a number of different reasons. The most common reason is to evaluate chest pain. Chest pain can be a symptom of coronary artery disease (CAD), and cardiac catheterization can show whether plaque is narrowing or blocking  your heart's arteries. This procedure is also used to evaluate the  valves, as well as measure the blood flow and oxygen levels in different parts of your heart. For further information please visit [email or web address]. Please follow instruction sheet, as given.   Follow-Up: At [facility], you and your health needs are our priority.  As part of our continuing mission to provide you with exceptional heart care, we have created designated Provider Care Teams.  These Care Teams include your primary Cardiologist (physician) and Advanced Practice Providers (APPs -  Physician Assistants and Nurse Practitioners) who all work together to provide you with the care you need, when you need it.  We recommend signing up for the patient portal called "MyChart".  Sign up information is provided on this After Visit Summary.  MyChart is used to connect with patients for Virtual Visits (Telemedicine).  Patients are able to view lab/test results, encounter notes, upcoming appointments, etc.  Non-urgent messages can be sent to your provider as well.   To learn more about what you can do with MyChart, go to [email or web address].    Your next appointment:   07/19/2022 @ 9:00 AM  Provider:   Eleonore Chiquito, MD   Other Instructions  [place] A DEPT OF [place] [id number] [place] [address] Dept: [phone] Loc: [phone]  Tiffany Mayo  07/05/2022  You are scheduled for a Cardiac Catheterization on Thursday, February 8 with Dr. Lauree Chandler.  1. Please arrive at the [facility] (Main Entrance A) at [facility]: [address] [place], [place] [address] at 11:30 AM (This time is two hours before your procedure to ensure your preparation). Free valet parking service is available.   Special note: Every effort is made to have your procedure done on time. Please understand  that emergencies sometimes delay scheduled procedures.  2. Diet: Do not eat solid foods after midnight.  The patient may have clear liquids until 5am upon the day of the procedure.  3. Labs: You will need to have blood drawn today- CBC, BMET. You do not need to be fasting.  4. Medication instructions in preparation for your procedure:   Contrast Allergy: No  Stop taking, Benicar (Olmesartan) Wednesday, February 7,   On the morning of your procedure, take your Aspirin 81 mg and any morning medicines NOT listed above.  You may use sips of water.  5. Plan for one night stay--bring personal belongings. 6. Bring a current list of your medications and current insurance cards. 7. You MUST have a responsible person to drive you home. 8. Someone MUST be with you the first 24 hours after you arrive home or your discharge will be delayed. 9. Please wear clothes that are easy to get on and off and wear slip-on shoes.  Thank you for allowing [place] to care for you!   -- [place] [facility]      Time Spent with Patient: I have spent a total of 35 minutes with patient reviewing hospital notes, telemetry, EKGs, labs and examining the patient as well as establishing an assessment and plan that was discussed with the patient.  > 50% of time was spent in direct patient care.  Signed, Addison Naegeli. Audie Box, MD, [place]  [address] [place], [place] [address] [phone]  07/05/2022 8:47 AM

## 2022-07-05 ENCOUNTER — Ambulatory Visit: Payer: Medicare PPO | Attending: Cardiovascular Disease | Admitting: Cardiovascular Disease

## 2022-07-05 ENCOUNTER — Encounter: Payer: Self-pay | Admitting: Cardiovascular Disease

## 2022-07-05 VITALS — BP 128/62 | HR 52 | Ht 61.0 in | Wt 140.8 lb

## 2022-07-05 DIAGNOSIS — I25110 Atherosclerotic heart disease of native coronary artery with unstable angina pectoris: Secondary | ICD-10-CM

## 2022-07-05 DIAGNOSIS — E782 Mixed hyperlipidemia: Secondary | ICD-10-CM

## 2022-07-05 NOTE — Patient Instructions (Addendum)
Medication Instructions:  The current medical regimen is effective;  continue present plan and medications.  *If you need a refill on your cardiac medications before your next appointment, please call your pharmacy*   Lab Work: CBC, BMET today   If you have labs (blood work) drawn today and your tests are completely normal, you will receive your results only by: [place] (if you have MyChart) OR A paper copy in the mail If you have any lab test that is abnormal or we need to change your treatment, we will call you to review the results.   Testing/Procedures:  Your physician has requested that you have a cardiac catheterization. Cardiac catheterization is used to diagnose and/or treat various heart conditions. Doctors may recommend this procedure for a number of different reasons. The most common reason is to evaluate chest pain. Chest pain can be a symptom of coronary artery disease (CAD), and cardiac catheterization can show whether plaque is narrowing or blocking your heart's arteries. This procedure is also used to evaluate the valves, as well as measure the blood flow and oxygen levels in different parts of your heart. For further information please visit [email or web address].[email or web address]. Please follow instruction sheet, as given.   Follow-Up: At [facility], you and your health needs are our priority.  As part of our continuing mission to provide you with exceptional heart care, we have created designated Provider Care Teams.  These Care Teams include your primary Cardiologist (physician) and Advanced Practice Providers (APPs -  Physician Assistants and Nurse Practitioners) who all work together to provide you with the care you need, when you need it.  We recommend signing up for the patient portal called "MyChart".  Sign up information is provided on this After Visit Summary.  MyChart is used to connect with patients for Virtual Visits (Telemedicine).  Patients are able to view  lab/test results, encounter notes, upcoming appointments, etc.  Non-urgent messages can be sent to your provider as well.   To learn more about what you can do with MyChart, go to [email or web address].    Your next appointment:   07/19/2022 @ 9:00 AM  Provider:   Eleonore Chiquito, MD   Other Instructions  [place] A DEPT OF [place] [id number] [place] [address] Dept: [phone] Loc: [phone]  Tiffany Mayo  07/05/2022  You are scheduled for a Cardiac Catheterization on Thursday, February 8 with Dr. Lauree Chandler.  1. Please arrive at the [facility] (Main Entrance A) at [facility]: [address] [place], [place] [address] at 11:30 AM (This time is two hours before your procedure to ensure your preparation). Free valet parking service is available.   Special note: Every effort is made to have your procedure done on time. Please understand that emergencies sometimes delay scheduled procedures.  2. Diet: Do not eat solid foods after midnight.  The patient may have clear liquids until 5am upon the day of the procedure.  3. Labs: You will need to have blood drawn today- CBC, BMET. You do not need to be fasting.  4. Medication instructions in preparation for your procedure:   Contrast Allergy: No  Stop taking, Benicar (Olmesartan) Wednesday, February 7,   On the morning of your procedure, take your Aspirin 81 mg and any morning medicines NOT listed above.  You may use sips of water.  5. Plan for one night stay--bring personal belongings. 6.  Bring a current list of your medications and current insurance cards. 7. You MUST have a responsible person to drive you home. 8. Someone MUST be with you the first 24 hours after you arrive home or your discharge will be delayed. 9. Please wear clothes that are easy to get on and off and wear slip-on  shoes.  Thank you for allowing [place] to care for you!   -- [place] [facility]

## 2022-07-06 ENCOUNTER — Telehealth: Payer: Self-pay | Admitting: *Deleted

## 2022-07-06 LAB — BASIC METABOLIC PANEL
BUN/Creatinine Ratio: 21 (ref 12–28)
BUN: 22 mg/dL (ref 8–27)
CO2: 27 mmol/L (ref 20–29)
Calcium: 9.5 mg/dL (ref 8.7–10.3)
Chloride: 105 mmol/L (ref 96–106)
Creatinine, Ser: 1.05 mg/dL — ABNORMAL HIGH (ref 0.57–1.00)
Glucose: 75 mg/dL (ref 70–99)
Potassium: 4.3 mmol/L (ref 3.5–5.2)
Sodium: 143 mmol/L (ref 134–144)
eGFR: 53 mL/min/{1.73_m2} — ABNORMAL LOW (ref >59)

## 2022-07-06 LAB — CBC
Hematocrit: 40.9 % (ref 34.0–46.6)
Hemoglobin: 13.6 g/dL (ref 11.1–15.9)
MCH: 32.5 pg (ref 26.6–33.0)
MCHC: 33.3 g/dL (ref 31.5–35.7)
MCV: 98 fL — ABNORMAL HIGH (ref 79–97)
Platelets: 185 10*3/uL (ref 150–450)
RBC: 4.18 x10E6/uL (ref 3.77–5.28)
RDW: 11.8 % (ref 11.7–15.4)
WBC: 5.5 10*3/uL (ref 3.4–10.8)

## 2022-07-06 NOTE — Telephone Encounter (Signed)
Cardiac Catheterization scheduled at [facility] for: Thursday July 08, 2022 1:30 PM Arrival time and place: [facility] Main Entrance A at: 11:30 AM  Nothing to eat after midnight prior to procedure, clear liquids until 5 AM day of procedure.  Medication instructions: -Hold: Olmesartan-day before and day of procedure -per protocol GFR 53 -Other usual morning medications can be taken with sips of water including aspirin 81 mg.  Confirmed patient has responsible adult to drive home post procedure and be with patient first 24 hours after arriving home.  Patient reports no new symptoms concerning for COVID-19 in the past 10 days.  Reviewed procedure instructions with patient.

## 2022-07-08 ENCOUNTER — Other Ambulatory Visit: Payer: Self-pay

## 2022-07-08 ENCOUNTER — Ambulatory Visit (HOSPITAL_COMMUNITY)
Admission: RE | Admit: 2022-07-08 | Discharge: 2022-07-08 | Disposition: A | Discharge status: Home / Self Care | Payer: Medicare PPO | Attending: Cardiovascular Disease | Admitting: Cardiovascular Disease

## 2022-07-08 ENCOUNTER — Ambulatory Visit (HOSPITAL_COMMUNITY)
Admission: RE | Disposition: A | Discharge status: Home / Self Care | Payer: Self-pay | Source: Home / Self Care | Attending: Cardiovascular Disease

## 2022-07-08 DIAGNOSIS — Z8249 Family history of ischemic heart disease and other diseases of the circulatory system: Secondary | ICD-10-CM | POA: Diagnosis not present

## 2022-07-08 DIAGNOSIS — I25118 Atherosclerotic heart disease of native coronary artery with other forms of angina pectoris: Secondary | ICD-10-CM

## 2022-07-08 DIAGNOSIS — Z7982 Long term (current) use of aspirin: Secondary | ICD-10-CM | POA: Diagnosis not present

## 2022-07-08 DIAGNOSIS — I25110 Atherosclerotic heart disease of native coronary artery with unstable angina pectoris: Secondary | ICD-10-CM | POA: Diagnosis not present

## 2022-07-08 DIAGNOSIS — I25119 Atherosclerotic heart disease of native coronary artery with unspecified angina pectoris: Secondary | ICD-10-CM

## 2022-07-08 DIAGNOSIS — E782 Mixed hyperlipidemia: Secondary | ICD-10-CM | POA: Diagnosis not present

## 2022-07-08 HISTORY — PX: LEFT HEART CATH AND CORONARY ANGIOGRAPHY: CATH118249

## 2022-07-08 SURGERY — LEFT HEART CATH AND CORONARY ANGIOGRAPHY
Anesthesia: LOCAL

## 2022-07-08 MED ORDER — ONDANSETRON HCL 4 MG/2ML IJ SOLN: 4.0000 mg | Freq: Four times a day (QID) | INTRAMUSCULAR | Status: DC | PRN

## 2022-07-08 MED ORDER — SODIUM CHLORIDE 0.9% FLUSH: 3.0000 mL | Freq: Two times a day (BID) | INTRAVENOUS | Status: DC

## 2022-07-08 MED ORDER — SODIUM CHLORIDE 0.9 % WEIGHT BASED INFUSION
3.0000 mL/kg/h | INTRAVENOUS | Status: AC
  Administered 2022-07-08: 3 mL/kg/h via INTRAVENOUS

## 2022-07-08 MED ORDER — SODIUM CHLORIDE 0.9 % IV SOLN: 250.0000 mL | INTRAVENOUS | Status: DC | PRN

## 2022-07-08 MED ORDER — ASPIRIN 81 MG PO CHEW: 81.0000 mg | CHEWABLE_TABLET | ORAL | Status: DC

## 2022-07-08 MED ORDER — HEPARIN (PORCINE) IN NACL 1000-0.9 UT/500ML-% IV SOLN
INTRAVENOUS | Status: DC | PRN
  Administered 2022-07-08: 1000 mL

## 2022-07-08 MED ORDER — LIDOCAINE HCL (PF) 1 % IJ SOLN
INTRAMUSCULAR | Status: DC | PRN
  Administered 2022-07-08: 20 mL via INTRADERMAL

## 2022-07-08 MED ORDER — SODIUM CHLORIDE 0.9% FLUSH: 3.0000 mL | INTRAVENOUS | Status: DC | PRN

## 2022-07-08 MED ORDER — MIDAZOLAM HCL 2 MG/2ML IJ SOLN
INTRAMUSCULAR | Status: AC
  Filled 2022-07-08: qty 2

## 2022-07-08 MED ORDER — MIDAZOLAM HCL 2 MG/2ML IJ SOLN
INTRAMUSCULAR | Status: DC | PRN
  Administered 2022-07-08 (×2): 1 mg via INTRAVENOUS

## 2022-07-08 MED ORDER — ASPIRIN 81 MG PO CHEW: 81.0000 mg | CHEWABLE_TABLET | Freq: Every day | ORAL | Status: DC

## 2022-07-08 MED ORDER — ISOSORBIDE MONONITRATE ER 30 MG PO TB24: 30.0000 mg | ORAL_TABLET | Freq: Every day | ORAL | 11 refills | Status: DC

## 2022-07-08 MED ORDER — ATORVASTATIN CALCIUM 80 MG PO TABS
80.0000 mg | ORAL_TABLET | Freq: Every day | ORAL | Status: DC
  Filled 2022-07-08: qty 1

## 2022-07-08 MED ORDER — VERAPAMIL HCL 2.5 MG/ML IV SOLN
INTRAVENOUS | Status: AC
  Filled 2022-07-08: qty 2

## 2022-07-08 MED ORDER — HEPARIN (PORCINE) IN NACL 1000-0.9 UT/500ML-% IV SOLN
INTRAVENOUS | Status: AC
  Filled 2022-07-08: qty 500

## 2022-07-08 MED ORDER — LABETALOL HCL 5 MG/ML IV SOLN: 10.0000 mg | INTRAVENOUS | Status: DC | PRN

## 2022-07-08 MED ORDER — SODIUM CHLORIDE 0.9 % WEIGHT BASED INFUSION: 1.0000 mL/kg/h | INTRAVENOUS | Status: DC

## 2022-07-08 MED ORDER — HYDRALAZINE HCL 20 MG/ML IJ SOLN: 10.0000 mg | INTRAMUSCULAR | Status: DC | PRN

## 2022-07-08 MED ORDER — FENTANYL CITRATE (PF) 100 MCG/2ML IJ SOLN
INTRAMUSCULAR | Status: AC
  Filled 2022-07-08: qty 2

## 2022-07-08 MED ORDER — ACETAMINOPHEN 325 MG PO TABS: 650.0000 mg | ORAL_TABLET | ORAL | Status: DC | PRN

## 2022-07-08 MED ORDER — SODIUM CHLORIDE 0.9 % IV SOLN: INTRAVENOUS | Status: DC

## 2022-07-08 MED ORDER — LIDOCAINE HCL (PF) 1 % IJ SOLN
INTRAMUSCULAR | Status: AC
  Filled 2022-07-08: qty 30

## 2022-07-08 MED ORDER — IOHEXOL 350 MG/ML SOLN
INTRAVENOUS | Status: DC | PRN
  Administered 2022-07-08: 70 mL

## 2022-07-08 MED ORDER — HEPARIN SODIUM (PORCINE) 1000 UNIT/ML IJ SOLN
INTRAMUSCULAR | Status: AC
  Filled 2022-07-08: qty 10

## 2022-07-08 SURGICAL SUPPLY — 12 items
CATH INFINITI 5FR MULTPACK ANG (CATHETERS) IMPLANT
CLOSURE MYNX CONTROL 5F (Vascular Products) IMPLANT
GLIDESHEATH SLEND SS 6F .021 (SHEATH) IMPLANT
GUIDEWIRE INQWIRE 1.5J.035X260 (WIRE) IMPLANT
INQWIRE 1.5J .035X260CM (WIRE) ×1
KIT HEART LEFT (KITS) ×1 IMPLANT
PACK CARDIAC CATHETERIZATION (CUSTOM PROCEDURE TRAY) ×1 IMPLANT
SHEATH PINNACLE 5F 10CM (SHEATH) IMPLANT
SHEATH PROBE COVER 6X72 (BAG) IMPLANT
TRANSDUCER W/STOPCOCK (MISCELLANEOUS) ×1 IMPLANT
TUBING CIL FLEX 10 FLL-RA (TUBING) ×1 IMPLANT
WIRE EMERALD 3MM-J .035X150CM (WIRE) IMPLANT

## 2022-07-08 NOTE — Interval H&P Note (Signed)
Cath Lab Visit (complete for each Cath Lab visit)  Clinical Evaluation Leading to the Procedure:   ACS: No.  Non-ACS:    Anginal Classification: CCS III  Anti-ischemic medical therapy: Minimal Therapy (1 class of medications)  Non-Invasive Test Results: High-risk stress test findings: cardiac mortality >3%/year  Prior CABG: No previous CABG      History and Physical Interval Note:  07/08/2022 7:36 AM  Tiffany Mayo  has presented today for surgery, with the diagnosis of unstable angina.  The various methods of treatment have been discussed with the patient and family. After consideration of risks, benefits and other options for treatment, the patient has consented to  Procedure(s): LEFT HEART CATH AND CORONARY ANGIOGRAPHY (N/A) as a surgical intervention.  The patient's history has been reviewed, patient examined, no change in status, stable for surgery.  I have reviewed the patient's chart and labs.  Questions were answered to the patient's satisfaction.     Shelva Majestic

## 2022-07-09 ENCOUNTER — Other Ambulatory Visit: Payer: Self-pay | Admitting: Cardiology

## 2022-07-09 ENCOUNTER — Encounter (HOSPITAL_COMMUNITY): Payer: Self-pay | Admitting: Cardiovascular Disease

## 2022-07-09 MED ORDER — CLOPIDOGREL BISULFATE 75 MG PO TABS: 75.0000 mg | ORAL_TABLET | Freq: Every day | ORAL | 11 refills | Status: DC

## 2022-07-09 MED FILL — Verapamil HCl IV Soln 2.5 MG/ML: INTRAVENOUS | Qty: 2 | Status: AC

## 2022-07-09 MED FILL — Fentanyl Citrate Preservative Free (PF) Inj 100 MCG/2ML: INTRAMUSCULAR | Qty: 2 | Status: AC

## 2022-07-09 NOTE — Pre-Procedure Instructions (Signed)
Notified Ms Demsky that she now has appt with Dr Burt Knack on 2/20.  We plan to bring her back to hospital on 2/21 to do PCI.  Also let her know we were starting her on Plavix, prescription was sent to [facility].  She verbalized understanding.

## 2022-07-12 ENCOUNTER — Telehealth: Payer: Self-pay

## 2022-07-12 NOTE — Telephone Encounter (Signed)
Called patient, moved appointment to 03/01-  Patient verbalized understanding.

## 2022-07-12 NOTE — Telephone Encounter (Signed)
-----   Message from Geralynn Rile, MD sent at 07/08/2022  3:43 PM EST ----- Thanks Ronalee Belts.   Almyra Free -> Can you move her follow-up to 1 week after 2/21?  [place] T. Audie Box, MD, [place]  [address] [place], [place] [address] [phone]  3:43 PM  ----- Message ----- From: Sherren Mocha, MD Sent: 07/08/2022  11:49 AM EST To: Geralynn Rile, MD; #  Jasmine Pang - this patient had a diagnostic cath today. There was some discussion about multivessel CABG versus PCI, but we are likely going to do PCI. Can you add her to my 2/20 office at 1:20pm, go ahead and start her on plavix 75 mg daily to be started this week, and schedule her for PCI on 2/21?  Thx Ronalee Belts

## 2022-07-13 ENCOUNTER — Telehealth: Payer: Self-pay | Admitting: Cardiovascular Disease

## 2022-07-13 NOTE — Telephone Encounter (Signed)
Pt states she would like a call back to discuss upcoming procedure on 02/21. She would like further instructions and information.

## 2022-07-13 NOTE — Telephone Encounter (Signed)
Contacted patient, she states she has not heard from anyone in regards to the upcoming procedure- I advised I had sent a message and they would call her as soon as they were able.   Thank you!  Jasmine Pang, I had send a staff message as well- just let me know if I need to do or help in any way. Thanks!

## 2022-07-14 NOTE — Telephone Encounter (Signed)
Called and spoke with patient about upcoming PCI procedure. Scheduled to see Burt Knack 2/19 @1$ :20. Details for cath sent to patient via [place]. No further questions at this time.

## 2022-07-19 ENCOUNTER — Ambulatory Visit: Payer: Medicare PPO | Admitting: Cardiovascular Disease

## 2022-07-20 ENCOUNTER — Encounter: Payer: Self-pay | Admitting: Cardiovascular Disease

## 2022-07-20 ENCOUNTER — Ambulatory Visit: Payer: Medicare PPO | Attending: Cardiovascular Disease | Admitting: Cardiovascular Disease

## 2022-07-20 VITALS — BP 130/68 | HR 56 | Ht 61.5 in | Wt 140.8 lb

## 2022-07-20 DIAGNOSIS — I25110 Atherosclerotic heart disease of native coronary artery with unstable angina pectoris: Secondary | ICD-10-CM

## 2022-07-20 LAB — BASIC METABOLIC PANEL
BUN/Creatinine Ratio: 19 (ref 12–28)
BUN: 20 mg/dL (ref 8–27)
CO2: 27 mmol/L (ref 20–29)
Calcium: 9.5 mg/dL (ref 8.7–10.3)
Chloride: 104 mmol/L (ref 96–106)
Creatinine, Ser: 1.04 mg/dL — ABNORMAL HIGH (ref 0.57–1.00)
Glucose: 103 mg/dL — ABNORMAL HIGH (ref 70–99)
Potassium: 4.4 mmol/L (ref 3.5–5.2)
Sodium: 140 mmol/L (ref 134–144)
eGFR: 54 mL/min/{1.73_m2} — ABNORMAL LOW (ref >59)

## 2022-07-20 LAB — CBC
Hematocrit: 36 % (ref 34.0–46.6)
Hemoglobin: 12 g/dL (ref 11.1–15.9)
MCH: 32.7 pg (ref 26.6–33.0)
MCHC: 33.3 g/dL (ref 31.5–35.7)
MCV: 98 fL — ABNORMAL HIGH (ref 79–97)
Platelets: 210 10*3/uL (ref 150–450)
RBC: 3.67 x10E6/uL — ABNORMAL LOW (ref 3.77–5.28)
RDW: 13.6 % (ref 11.7–15.4)
WBC: 6.6 10*3/uL (ref 3.4–10.8)

## 2022-07-20 NOTE — Patient Instructions (Signed)
Medication Instructions:  Your physician recommends that you continue on your current medications as directed. Please refer to the Current Medication list given to you today.  *If you need a refill on your cardiac medications before your next appointment, please call your pharmacy*   Lab Work: STAT BMET & CBC today If you have labs (blood work) drawn today and your tests are completely normal, you will receive your results only by: [place] (if you have MyChart) OR A paper copy in the mail If you have any lab test that is abnormal or we need to change your treatment, we will call you to review the results.   Testing/Procedures: NONE -PCI procedure tomorrow   Follow-Up: At [facility], you and your health needs are our priority.  As part of our continuing mission to provide you with exceptional heart care, we have created designated Provider Care Teams.  These Care Teams include your primary Cardiologist (physician) and Advanced Practice Providers (APPs -  Physician Assistants and Nurse Practitioners) who all work together to provide you with the care you need, when you need it.  Your next appointment:   Friday 07/30/22 @0900$   Provider:   [place]

## 2022-07-20 NOTE — H&P (View-Only) (Signed)
Cardiology Office Note:    Date:  07/20/2022   ID:  Tiffany Mayo, DOB 05-27-1941, MRN [id number]  PCP:  Haywood Pao, MD   [facility] Providers Cardiologist:  None     Referring MD: Haywood Pao, MD   Chief Complaint  Patient presents with   Coronary Artery Disease    History of Present Illness:    Tiffany Mayo is a 82 y.o. female presenting for evaluation of coronary artery disease.  The patient is followed by Dr. Davina Poke.  She underwent a gated coronary CTA demonstrating severe two-vessel coronary artery disease.  She has experienced progressive chest burning typical of angina, responsive to sublingual nitroglycerin.  CT-FFR was strongly positive in the LAD and left circumflex branches.  She underwent diagnostic cardiac catheterization 07/08/2022, confirming severe stenosis in the LAD and left circumflex/obtuse marginal branches.  She presents today to discuss revascularization options.  She is referred for an interventional opinion as she has expressed concern about going through CABG at her age.  The patient is here with her son today.  She has had modest improvement in her angina since she has started isosorbide and metoprolol.  However, she still describes central chest burning with any moderate level activity.  She experienced this today when walking into our office.  It took about 10 minutes for her symptoms to resolve.  She has had no resting chest pain or pressure, dyspnea, orthopnea, PND, or leg swelling.  She thinks symptoms have been present since last summer but have progressively worsened.  Past Medical History:  Diagnosis Date   Arthritis    spine, neck   GERD (gastroesophageal reflux disease)    Headache    SINUS    Hemorrhoid    Hyperlipemia    Hypertension    Hypothyroidism     Past Surgical History:  Procedure Laterality Date   ABDOMINAL HYSTERECTOMY  1990   ANTERIOR CERVICAL DECOMP/DISCECTOMY FUSION  04/03/2012   Procedure:  ANTERIOR CERVICAL DECOMPRESSION/DISCECTOMY FUSION 1 LEVEL;  Surgeon: Hosie Spangle, MD;  Location: [facility];  Service: Neurosurgery;  Laterality: N/A;  Cervical Five-Six Anterior Cervical Decompression with Fusion Plating and Bonegraft   BUNIONECTOMY     RIGHT FOOT   EYE SURGERY     Lasik, Catarct   GANGLION CYST EXCISION     BIL WRIST    JOINT REPLACEMENT     Right middle finger   LEFT HEART CATH AND CORONARY ANGIOGRAPHY N/A 07/08/2022   Procedure: LEFT HEART CATH AND CORONARY ANGIOGRAPHY;  Surgeon: Troy Sine, MD;  Location: [facility];  Service: Cardiovascular;  Laterality: N/A;   TRIGGER FINGER RELEASE     thumbs    Current Medications: Current Meds  Medication Sig   acetaminophen (TYLENOL) 500 MG tablet Take 500-1,000 mg by mouth every 6 (six) hours as needed (pain.).   aspirin EC 81 MG tablet Take 1 tablet (81 mg total) by mouth daily. Swallow whole.   atorvastatin (LIPITOR) 80 MG tablet Take 80 mg by mouth in the morning.   clopidogrel (PLAVIX) 75 MG tablet Take 1 tablet (75 mg total) by mouth daily.   escitalopram (LEXAPRO) 5 MG tablet Take 5 mg by mouth in the morning.   isosorbide mononitrate (IMDUR) 30 MG 24 hr tablet Take 1 tablet (30 mg total) by mouth daily.   levothyroxine (SYNTHROID) 50 MCG tablet Take 50 mcg by mouth every other day.   levothyroxine (SYNTHROID) 75 MCG tablet Take 75 mcg  by mouth every other day.   metoprolol tartrate (LOPRESSOR) 25 MG tablet Take 1 tablet (25 mg total) by mouth 2 (two) times daily.   Multiple Vitamins-Minerals (AIRBORNE PO) Take 3 tablets by mouth daily.   nitroGLYCERIN (NITROSTAT) 0.4 MG SL tablet Place 1 tablet (0.4 mg total) under the tongue every 5 (five) minutes as needed for chest pain.   olmesartan (BENICAR) 20 MG tablet Take 20 mg by mouth in the morning.   pantoprazole (PROTONIX) 20 MG tablet Take 20 mg by mouth in the morning.   Polyethyl Glycol-Propyl Glycol (SYSTANE ULTRA) 0.4-0.3 % SOLN Place 1-2 drops  into both eyes 3 (three) times daily as needed (dry/irritated eyes.).     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  Tobacco Use   Smoking status: Never   Smokeless tobacco: Never  Substance and Sexual Activity   Alcohol use: Yes    Comment: OCC WINE    Drug use: No   Sexual activity: Not on file  Other Topics Concern   Not on file  Social History Narrative   Not on file   Social Determinants of Health   Financial Resource Strain: Not on file  Food Insecurity: No Food Insecurity (04/08/2022)   Hunger Vital Sign    Worried About Running Out of Food in the Last Year: Never true    Ran Out of Food in the Last Year: Never true  Transportation Needs: No Transportation Needs (04/08/2022)   PRAPARE - Hydrologist (Medical): No    Lack of Transportation (Non-Medical): No  Physical Activity: Not on file  Stress: Not on file  Social Connections: Not on file     Family History: The patient's family history includes Heart disease in her father.  ROS:   Please see the history of present illness.    All other systems reviewed and are negative.  EKGs/Labs/Other Studies Reviewed:    The following studies were reviewed today: Coronary CTA: IMPRESSION: 1. Coronary calcium score of 544. This was 80th percentile for age-, race-, and sex-matched controls.   2. Normal coronary origin with right dominance.   3. There is severe (>70%) plaque in the LCX and LAD. There is moderate (50-69%) stenosis in the RCA. CAD-RADS 4.   4.  Will send study for FFRct.   5.  Aortic atherosclerosis.   6.  Aortic valve calcification.  CT-FFR: 1. Left Main: FFRct 1.0   2. LAD: FFRct 0.99 proximal,  0.67 mid   3. [id number] 1.0 proximal, 0.69 mid   4. RCA: FFRct 0.99 proximal, 0.88 mid, 0.84 distal   IMPRESSION: 1. FFRct findings are consistent with obstructive coronary disease in the LAD and LCX. Recommend cardiac catheterization.  Cardiac Cath:   Prox RCA lesion is 30% stenosed.   RV Branch lesion is 80% stenosed.   1st Diag-1 lesion is 80% stenosed.   1st Diag-2 lesion is 95% stenosed.   Prox LAD to Mid LAD lesion is 80% stenosed.   Prox LAD lesion is 60% stenosed.   Prox Cx to Mid Cx lesion is 80% stenosed.   2nd Mrg lesion is 90% stenosed.   The left ventricular  systolic function is normal.   LV end diastolic pressure is normal.   Multivessel coronary artery disease with smooth 60% proximal LAD stenosis before the first diagonal vessel with 80% ostial and 95% stenosis in a small first diagonal vessel and 80% stenosis in the mid LAD; 80% proximal circumflex stenosis with 90% stenosis in the proximal mid OM branch; dominant RCA with smooth 30% mid stenosis and 80% stenosis in a small RV marginal branch.   Low normal LV function with EF estimated at 50 to 55%.  LVEDP 15 mm.  There is a subtle area of distal inferior hypocontractility.   RECOMMENDATION: Consider initial surgical consultation with diffuse disease in the LAD and circumflex system.  Will add isosorbide to the patient's metoprolol.  If against surgical revascularization can consider PCI to the LAD, circumflex, and circumflex marginal vessels.  Rest of lipid-lowering therapy with target LDL less than 70.  Echo 04/08/2022:  1. Left ventricular ejection fraction, by estimation, is 55 to 60%. The  left ventricle has normal function. The left ventricle has no regional  wall motion abnormalities. Left ventricular diastolic parameters were  normal.   2. Right ventricular systolic function is normal. The right ventricular  size is normal. There is normal pulmonary artery systolic pressure.   3. The mitral valve is  abnormal. No evidence of mitral valve  regurgitation. No evidence of mitral stenosis.   4. The aortic valve is tricuspid. There is moderate calcification of the  aortic valve. There is moderate thickening of the aortic valve. Aortic  valve regurgitation is mild. Aortic valve sclerosis is present, with no  evidence of aortic valve stenosis.   5. The inferior vena cava is normal in size with greater than 50%  respiratory variability, suggesting right atrial pressure of 3 mmHg.   EKG:  EKG is not ordered today.    Recent Labs: 04/07/2022: ALT 14 04/08/2022: TSH 4.968 07/20/2022: BUN 20; Creatinine, Ser 1.04; Hemoglobin 12.0; Platelets 210; Potassium 4.4; Sodium 140  Recent Lipid Panel    Component Value Date/Time   CHOL 203 (H) 04/08/2022 0000   TRIG 104 04/08/2022 0000   HDL 44 04/08/2022 0000   CHOLHDL 4.6 04/08/2022 0000   VLDL 21 04/08/2022 0000   LDLCALC 138 (H) 04/08/2022 0000     Risk Assessment/Calculations:                Physical Exam:    VS:  BP 130/68   Pulse (!) 56   Ht 5' 1.5" (1.562 m)   Wt 140 lb 12.8 oz (63.9 kg)   SpO2 96%   BMI 26.17 kg/m     Wt Readings from Last 3 Encounters:  07/20/22 140 lb 12.8 oz (63.9 kg)  07/08/22 140 lb (63.5 kg)  07/05/22 140 lb 12.8 oz (63.9 kg)     GEN:  Well nourished, well developed in no acute distress HEENT: Normal NECK: No JVD; No carotid bruits LYMPHATICS: No lymphadenopathy CARDIAC: RRR, no murmurs, rubs, gallops RESPIRATORY:  Clear to auscultation without rales, wheezing or rhonchi  ABDOMEN: Soft, non-tender, non-distended MUSCULOSKELETAL:  No edema; No deformity  SKIN: Warm and dry NEUROLOGIC:  Alert and oriented x 3 PSYCHIATRIC:  Normal affect   ASSESSMENT:    1. Coronary artery disease involving native coronary artery of native heart with unstable angina pectoris ([place])    PLAN:    In order of problems listed above:  The patient has CCS functional class III symptoms of angina with exertion,  progressive in  nature, in the setting of severe two-vessel coronary artery disease.  She is on appropriate medical therapy as outlined above.  With an absence of diabetes, LV dysfunction, or high syntax score, PCI can be considered as a reasonable and appropriate treatment option in this patient for relief of angina.  The patient has severe mid LAD stenosis, severe mid circumflex and obtuse marginal stenoses, all amenable to PCI.  She has moderate 50% proximal LAD stenosis that was not hemodynamically significant by CT-FFR.  She also has very severe subtotal occlusive stenosis of a very small diagonal branch.  This will likely require medical therapy as I suspect it is too small for PCI.  Will reevaluate at the time of her PCI procedure. I have reviewed the risks, indications, and alternatives to cardiac catheterization, possible angioplasty, and stenting with the patient. Risks include but are not limited to bleeding, infection, vascular injury, stroke, myocardial infection, arrhythmia, kidney injury, radiation-related injury in the case of prolonged fluoroscopy use, emergency cardiac surgery, and death. The patient understands the risks of serious complication is 1-2 in [id number] with diagnostic cardiac cath and 1-2% or less with angioplasty/stenting.  The patient is tolerating DAPT with aspirin and clopidogrel and this will be continued through at least 6 months for treatment of stable CAD.  Otherwise her medical program will be continued as directed by Dr. Audie Box.         AUC Criteria: CCS angina class: 3, anti-anginals: 2 (metoprolol and isosorbide), prior CABG: no, stress testing risk: coronary CTA with MV disease      Shared Decision Making/Informed Consent The risks [stroke (1 in 1000), death (1 in 1000), kidney failure [usually temporary] (1 in 500), bleeding (1 in 200), allergic reaction [possibly serious] (1 in 200)], benefits (diagnostic support and management of coronary artery disease) and  alternatives of a cardiac catheterization were discussed in detail with Ms. Basso and she is willing to proceed.    Medication Adjustments/Labs and Tests Ordered: Current medicines are reviewed at length with the patient today.  Concerns regarding medicines are outlined above.  Orders Placed This Encounter  Procedures   CBC   Basic metabolic panel   No orders of the defined types were placed in this encounter.   Patient Instructions  Medication Instructions:  Your physician recommends that you continue on your current medications as directed. Please refer to the Current Medication list given to you today.  *If you need a refill on your cardiac medications before your next appointment, please call your pharmacy*   Lab Work: STAT BMET & CBC today If you have labs (blood work) drawn today and your tests are completely normal, you will receive your results only by: [facility] (if you have MyChart) OR A paper copy in the mail If you have any lab test that is abnormal or we need to change your treatment, we will call you to review the results.   Testing/Procedures: NONE -PCI procedure tomorrow   Follow-Up: At [facility], you and your health needs are our priority.  As part of our continuing mission to provide you with exceptional heart care, we have created designated Provider Care Teams.  These Care Teams include your primary Cardiologist (physician) and Advanced Practice Providers (APPs -  Physician Assistants and Nurse Practitioners) who all work together to provide you with the care you need, when you need it.  Your next appointment:   Friday 07/30/22 @0900$   Provider:   [place]     Signed, Sherren Mocha,  MD  07/20/2022 5:51 PM    [place]

## 2022-07-20 NOTE — Progress Notes (Signed)
Cardiology Office Note:    Date:  07/20/2022   ID:  Tiffany Mayo, DOB 1940-07-24, MRN [id number]  PCP:  Haywood Pao, MD   [place] Providers Cardiologist:  None     Referring MD: Haywood Pao, MD   Chief Complaint  Patient presents with   Coronary Artery Disease    History of Present Illness:    Tiffany Mayo is a 82 y.o. female presenting for evaluation of coronary artery disease.  The patient is followed by Dr. Davina Poke.  She underwent a gated coronary CTA demonstrating severe two-vessel coronary artery disease.  She has experienced progressive chest burning typical of angina, responsive to sublingual nitroglycerin.  CT-FFR was strongly positive in the LAD and left circumflex branches.  She underwent diagnostic cardiac catheterization 07/08/2022, confirming severe stenosis in the LAD and left circumflex/obtuse marginal branches.  She presents today to discuss revascularization options.  She is referred for an interventional opinion as she has expressed concern about going through CABG at her age.  The patient is here with her son today.  She has had modest improvement in her angina since she has started isosorbide and metoprolol.  However, she still describes central chest burning with any moderate level activity.  She experienced this today when walking into our office.  It took about 10 minutes for her symptoms to resolve.  She has had no resting chest pain or pressure, dyspnea, orthopnea, PND, or leg swelling.  She thinks symptoms have been present since last summer but have progressively worsened.  Past Medical History:  Diagnosis Date   Arthritis    spine, neck   GERD (gastroesophageal reflux disease)    Headache    SINUS    Hemorrhoid    Hyperlipemia    Hypertension    Hypothyroidism     Past Surgical History:  Procedure Laterality Date   ABDOMINAL HYSTERECTOMY  1990   ANTERIOR CERVICAL DECOMP/DISCECTOMY FUSION  04/03/2012   Procedure:  ANTERIOR CERVICAL DECOMPRESSION/DISCECTOMY FUSION 1 LEVEL;  Surgeon: Hosie Spangle, MD;  Location: [facility];  Service: Neurosurgery;  Laterality: N/A;  Cervical Five-Six Anterior Cervical Decompression with Fusion Plating and Bonegraft   BUNIONECTOMY     RIGHT FOOT   EYE SURGERY     Lasik, Catarct   GANGLION CYST EXCISION     BIL WRIST    JOINT REPLACEMENT     Right middle finger   LEFT HEART CATH AND CORONARY ANGIOGRAPHY N/A 07/08/2022   Procedure: LEFT HEART CATH AND CORONARY ANGIOGRAPHY;  Surgeon: Troy Sine, MD;  Location: [facility];  Service: Cardiovascular;  Laterality: N/A;   TRIGGER FINGER RELEASE     thumbs    Current Medications: Current Meds  Medication Sig   acetaminophen (TYLENOL) 500 MG tablet Take 500-1,000 mg by mouth every 6 (six) hours as needed (pain.).   aspirin EC 81 MG tablet Take 1 tablet (81 mg total) by mouth daily. Swallow whole.   atorvastatin (LIPITOR) 80 MG tablet Take 80 mg by mouth in the morning.   clopidogrel (PLAVIX) 75 MG tablet Take 1 tablet (75 mg total) by mouth daily.   escitalopram (LEXAPRO) 5 MG tablet Take 5 mg by mouth in the morning.   isosorbide mononitrate (IMDUR) 30 MG 24 hr tablet Take 1 tablet (30 mg total) by mouth daily.   levothyroxine (SYNTHROID) 50 MCG tablet Take 50 mcg by mouth every other day.   levothyroxine (SYNTHROID) 75 MCG tablet Take 75 mcg  by mouth every other day.   metoprolol tartrate (LOPRESSOR) 25 MG tablet Take 1 tablet (25 mg total) by mouth 2 (two) times daily.   Multiple Vitamins-Minerals (AIRBORNE PO) Take 3 tablets by mouth daily.   nitroGLYCERIN (NITROSTAT) 0.4 MG SL tablet Place 1 tablet (0.4 mg total) under the tongue every 5 (five) minutes as needed for chest pain.   olmesartan (BENICAR) 20 MG tablet Take 20 mg by mouth in the morning.   pantoprazole (PROTONIX) 20 MG tablet Take 20 mg by mouth in the morning.   Polyethyl Glycol-Propyl Glycol (SYSTANE ULTRA) 0.4-0.3 % SOLN Place 1-2 drops  into both eyes 3 (three) times daily as needed (dry/irritated eyes.).     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  Tobacco Use   Smoking status: Never   Smokeless tobacco: Never  Substance and Sexual Activity   Alcohol use: Yes    Comment: OCC WINE    Drug use: No   Sexual activity: Not on file  Other Topics Concern   Not on file  Social History Narrative   Not on file   Social Determinants of Health   Financial Resource Strain: Not on file  Food Insecurity: No Food Insecurity (04/08/2022)   Hunger Vital Sign    Worried About Running Out of Food in the Last Year: Never true    Ran Out of Food in the Last Year: Never true  Transportation Needs: No Transportation Needs (04/08/2022)   PRAPARE - Hydrologist (Medical): No    Lack of Transportation (Non-Medical): No  Physical Activity: Not on file  Stress: Not on file  Social Connections: Not on file     Family History: The patient's family history includes Heart disease in her father.  ROS:   Please see the history of present illness.    All other systems reviewed and are negative.  EKGs/Labs/Other Studies Reviewed:    The following studies were reviewed today: Coronary CTA: IMPRESSION: 1. Coronary calcium score of 544. This was 80th percentile for age-, race-, and sex-matched controls.   2. Normal coronary origin with right dominance.   3. There is severe (>70%) plaque in the LCX and LAD. There is moderate (50-69%) stenosis in the RCA. CAD-RADS 4.   4.  Will send study for FFRct.   5.  Aortic atherosclerosis.   6.  Aortic valve calcification.  CT-FFR: 1. Left Main: FFRct 1.0   2. LAD: FFRct 0.99 proximal,  0.67 mid   3. [id number] 1.0 proximal, 0.69 mid   4. RCA: FFRct 0.99 proximal, 0.88 mid, 0.84 distal   IMPRESSION: 1. FFRct findings are consistent with obstructive coronary disease in the LAD and LCX. Recommend cardiac catheterization.  Cardiac Cath:   Prox RCA lesion is 30% stenosed.   RV Branch lesion is 80% stenosed.   1st Diag-1 lesion is 80% stenosed.   1st Diag-2 lesion is 95% stenosed.   Prox LAD to Mid LAD lesion is 80% stenosed.   Prox LAD lesion is 60% stenosed.   Prox Cx to Mid Cx lesion is 80% stenosed.   2nd Mrg lesion is 90% stenosed.   The left ventricular  systolic function is normal.   LV end diastolic pressure is normal.   Multivessel coronary artery disease with smooth 60% proximal LAD stenosis before the first diagonal vessel with 80% ostial and 95% stenosis in a small first diagonal vessel and 80% stenosis in the mid LAD; 80% proximal circumflex stenosis with 90% stenosis in the proximal mid OM branch; dominant RCA with smooth 30% mid stenosis and 80% stenosis in a small RV marginal branch.   Low normal LV function with EF estimated at 50 to 55%.  LVEDP 15 mm.  There is a subtle area of distal inferior hypocontractility.   RECOMMENDATION: Consider initial surgical consultation with diffuse disease in the LAD and circumflex system.  Will add isosorbide to the patient's metoprolol.  If against surgical revascularization can consider PCI to the LAD, circumflex, and circumflex marginal vessels.  Rest of lipid-lowering therapy with target LDL less than 70.  Echo 04/08/2022:  1. Left ventricular ejection fraction, by estimation, is 55 to 60%. The  left ventricle has normal function. The left ventricle has no regional  wall motion abnormalities. Left ventricular diastolic parameters were  normal.   2. Right ventricular systolic function is normal. The right ventricular  size is normal. There is normal pulmonary artery systolic pressure.   3. The mitral valve is  abnormal. No evidence of mitral valve  regurgitation. No evidence of mitral stenosis.   4. The aortic valve is tricuspid. There is moderate calcification of the  aortic valve. There is moderate thickening of the aortic valve. Aortic  valve regurgitation is mild. Aortic valve sclerosis is present, with no  evidence of aortic valve stenosis.   5. The inferior vena cava is normal in size with greater than 50%  respiratory variability, suggesting right atrial pressure of 3 mmHg.   EKG:  EKG is not ordered today.    Recent Labs: 04/07/2022: ALT 14 04/08/2022: TSH 4.968 07/20/2022: BUN 20; Creatinine, Ser 1.04; Hemoglobin 12.0; Platelets 210; Potassium 4.4; Sodium 140  Recent Lipid Panel    Component Value Date/Time   CHOL 203 (H) 04/08/2022 0000   TRIG 104 04/08/2022 0000   HDL 44 04/08/2022 0000   CHOLHDL 4.6 04/08/2022 0000   VLDL 21 04/08/2022 0000   LDLCALC 138 (H) 04/08/2022 0000     Risk Assessment/Calculations:                Physical Exam:    VS:  BP 130/68   Pulse (!) 56   Ht 5' 1.5" (1.562 m)   Wt 140 lb 12.8 oz (63.9 kg)   SpO2 96%   BMI 26.17 kg/m     Wt Readings from Last 3 Encounters:  07/20/22 140 lb 12.8 oz (63.9 kg)  07/08/22 140 lb (63.5 kg)  07/05/22 140 lb 12.8 oz (63.9 kg)     GEN:  Well nourished, well developed in no acute distress HEENT: Normal NECK: No JVD; No carotid bruits LYMPHATICS: No lymphadenopathy CARDIAC: RRR, no murmurs, rubs, gallops RESPIRATORY:  Clear to auscultation without rales, wheezing or rhonchi  ABDOMEN: Soft, non-tender, non-distended MUSCULOSKELETAL:  No edema; No deformity  SKIN: Warm and dry NEUROLOGIC:  Alert and oriented x 3 PSYCHIATRIC:  Normal affect   ASSESSMENT:    1. Coronary artery disease involving native coronary artery of native heart with unstable angina pectoris ([place])    PLAN:    In order of problems listed above:  The patient has CCS functional class III symptoms of angina with exertion,  progressive in  nature, in the setting of severe two-vessel coronary artery disease.  She is on appropriate medical therapy as outlined above.  With an absence of diabetes, LV dysfunction, or high syntax score, PCI can be considered as a reasonable and appropriate treatment option in this patient for relief of angina.  The patient has severe mid LAD stenosis, severe mid circumflex and obtuse marginal stenoses, all amenable to PCI.  She has moderate 50% proximal LAD stenosis that was not hemodynamically significant by CT-FFR.  She also has very severe subtotal occlusive stenosis of a very small diagonal branch.  This will likely require medical therapy as I suspect it is too small for PCI.  Will reevaluate at the time of her PCI procedure. I have reviewed the risks, indications, and alternatives to cardiac catheterization, possible angioplasty, and stenting with the patient. Risks include but are not limited to bleeding, infection, vascular injury, stroke, myocardial infection, arrhythmia, kidney injury, radiation-related injury in the case of prolonged fluoroscopy use, emergency cardiac surgery, and death. The patient understands the risks of serious complication is 1-2 in [id number] with diagnostic cardiac cath and 1-2% or less with angioplasty/stenting.  The patient is tolerating DAPT with aspirin and clopidogrel and this will be continued through at least 6 months for treatment of stable CAD.  Otherwise her medical program will be continued as directed by Dr. Audie Box.         AUC Criteria: CCS angina class: 3, anti-anginals: 2 (metoprolol and isosorbide), prior CABG: no, stress testing risk: coronary CTA with MV disease      Shared Decision Making/Informed Consent The risks [stroke (1 in 1000), death (1 in 1000), kidney failure [usually temporary] (1 in 500), bleeding (1 in 200), allergic reaction [possibly serious] (1 in 200)], benefits (diagnostic support and management of coronary artery disease) and  alternatives of a cardiac catheterization were discussed in detail with Ms. Hoon and she is willing to proceed.    Medication Adjustments/Labs and Tests Ordered: Current medicines are reviewed at length with the patient today.  Concerns regarding medicines are outlined above.  Orders Placed This Encounter  Procedures   CBC   Basic metabolic panel   No orders of the defined types were placed in this encounter.   Patient Instructions  Medication Instructions:  Your physician recommends that you continue on your current medications as directed. Please refer to the Current Medication list given to you today.  *If you need a refill on your cardiac medications before your next appointment, please call your pharmacy*   Lab Work: STAT BMET & CBC today If you have labs (blood work) drawn today and your tests are completely normal, you will receive your results only by: [facility] (if you have MyChart) OR A paper copy in the mail If you have any lab test that is abnormal or we need to change your treatment, we will call you to review the results.   Testing/Procedures: NONE -PCI procedure tomorrow   Follow-Up: At [facility], you and your health needs are our priority.  As part of our continuing mission to provide you with exceptional heart care, we have created designated Provider Care Teams.  These Care Teams include your primary Cardiologist (physician) and Advanced Practice Providers (APPs -  Physician Assistants and Nurse Practitioners) who all work together to provide you with the care you need, when you need it.  Your next appointment:   Friday 07/30/22 @0900$   Provider:   [place]     Signed, Sherren Mocha,  MD  07/20/2022 5:51 PM    [place]

## 2022-07-21 ENCOUNTER — Encounter (HOSPITAL_COMMUNITY): Payer: Self-pay | Admitting: Cardiovascular Disease

## 2022-07-21 ENCOUNTER — Other Ambulatory Visit: Payer: Self-pay

## 2022-07-21 ENCOUNTER — Encounter (HOSPITAL_COMMUNITY)
Admission: RE | Disposition: A | Discharge status: Home / Self Care | Payer: Self-pay | Source: Home / Self Care | Attending: Cardiovascular Disease

## 2022-07-21 ENCOUNTER — Observation Stay (HOSPITAL_COMMUNITY)
Admission: RE | Admit: 2022-07-21 | Discharge: 2022-07-22 | Disposition: A | Discharge status: Home / Self Care | Payer: Medicare PPO | Attending: Cardiology | Admitting: Cardiology

## 2022-07-21 DIAGNOSIS — E039 Hypothyroidism, unspecified: Secondary | ICD-10-CM | POA: Diagnosis not present

## 2022-07-21 DIAGNOSIS — Z79899 Other long term (current) drug therapy: Secondary | ICD-10-CM | POA: Insufficient documentation

## 2022-07-21 DIAGNOSIS — I25118 Atherosclerotic heart disease of native coronary artery with other forms of angina pectoris: Secondary | ICD-10-CM | POA: Diagnosis not present

## 2022-07-21 DIAGNOSIS — I10 Essential (primary) hypertension: Secondary | ICD-10-CM | POA: Diagnosis not present

## 2022-07-21 DIAGNOSIS — R0789 Other chest pain: Secondary | ICD-10-CM | POA: Diagnosis present

## 2022-07-21 DIAGNOSIS — Z955 Presence of coronary angioplasty implant and graft: Secondary | ICD-10-CM

## 2022-07-21 DIAGNOSIS — I25119 Atherosclerotic heart disease of native coronary artery with unspecified angina pectoris: Secondary | ICD-10-CM | POA: Diagnosis not present

## 2022-07-21 DIAGNOSIS — Z7902 Long term (current) use of antithrombotics/antiplatelets: Secondary | ICD-10-CM | POA: Diagnosis not present

## 2022-07-21 DIAGNOSIS — Z7982 Long term (current) use of aspirin: Secondary | ICD-10-CM | POA: Diagnosis not present

## 2022-07-21 DIAGNOSIS — K219 Gastro-esophageal reflux disease without esophagitis: Secondary | ICD-10-CM | POA: Diagnosis present

## 2022-07-21 DIAGNOSIS — E785 Hyperlipidemia, unspecified: Secondary | ICD-10-CM | POA: Diagnosis present

## 2022-07-21 HISTORY — PX: CORONARY STENT INTERVENTION: CATH118234

## 2022-07-21 LAB — POCT ACTIVATED CLOTTING TIME
Activated Clotting Time: 293 seconds
Activated Clotting Time: 250 seconds

## 2022-07-21 SURGERY — CORONARY STENT INTERVENTION
Anesthesia: LOCAL

## 2022-07-21 MED ORDER — VERAPAMIL HCL 2.5 MG/ML IV SOLN
INTRAVENOUS | Status: DC | PRN
  Administered 2022-07-21: 10 mL via INTRA_ARTERIAL

## 2022-07-21 MED ORDER — NITROGLYCERIN 1 MG/10 ML FOR IR/CATH LAB
INTRA_ARTERIAL | Status: AC
  Filled 2022-07-21: qty 10

## 2022-07-21 MED ORDER — SODIUM CHLORIDE 0.9 % WEIGHT BASED INFUSION: 1.0000 mL/kg/h | INTRAVENOUS | Status: DC

## 2022-07-21 MED ORDER — SODIUM CHLORIDE 0.9 % IV SOLN: 250.0000 mL | INTRAVENOUS | Status: DC | PRN

## 2022-07-21 MED ORDER — MIDAZOLAM HCL 2 MG/2ML IJ SOLN
INTRAMUSCULAR | Status: AC
  Filled 2022-07-21: qty 2

## 2022-07-21 MED ORDER — SODIUM CHLORIDE 0.9% FLUSH: 3.0000 mL | INTRAVENOUS | Status: DC | PRN

## 2022-07-21 MED ORDER — NITROGLYCERIN 1 MG/10 ML FOR IR/CATH LAB
INTRA_ARTERIAL | Status: DC | PRN
  Administered 2022-07-21 (×2): 100 ug via INTRACORONARY

## 2022-07-21 MED ORDER — ACETAMINOPHEN 500 MG PO TABS
500.0000 mg | ORAL_TABLET | Freq: Four times a day (QID) | ORAL | Status: DC | PRN
  Administered 2022-07-21: 500 mg via ORAL
  Filled 2022-07-21: qty 2

## 2022-07-21 MED ORDER — SODIUM CHLORIDE 0.9% FLUSH: 3.0000 mL | Freq: Two times a day (BID) | INTRAVENOUS | Status: DC

## 2022-07-21 MED ORDER — ORAL CARE MOUTH RINSE: 15.0000 mL | OROMUCOSAL | Status: DC | PRN

## 2022-07-21 MED ORDER — HEPARIN (PORCINE) IN NACL 1000-0.9 UT/500ML-% IV SOLN
INTRAVENOUS | Status: DC | PRN
  Administered 2022-07-21 (×3): 500 mL

## 2022-07-21 MED ORDER — ATORVASTATIN CALCIUM 80 MG PO TABS
80.0000 mg | ORAL_TABLET | Freq: Every morning | ORAL | Status: DC
  Administered 2022-07-22: 80 mg via ORAL
  Filled 2022-07-21: qty 1

## 2022-07-21 MED ORDER — HYDRALAZINE HCL 20 MG/ML IJ SOLN: 10.0000 mg | INTRAMUSCULAR | Status: AC | PRN

## 2022-07-21 MED ORDER — PANTOPRAZOLE SODIUM 20 MG PO TBEC
20.0000 mg | DELAYED_RELEASE_TABLET | Freq: Every morning | ORAL | Status: DC
  Administered 2022-07-22: 20 mg via ORAL
  Filled 2022-07-21: qty 1

## 2022-07-21 MED ORDER — ONDANSETRON HCL 4 MG/2ML IJ SOLN: 4.0000 mg | Freq: Four times a day (QID) | INTRAMUSCULAR | Status: DC | PRN

## 2022-07-21 MED ORDER — IOHEXOL 350 MG/ML SOLN
INTRAVENOUS | Status: DC | PRN
  Administered 2022-07-21: 85 mL

## 2022-07-21 MED ORDER — ASPIRIN 81 MG PO TBEC
81.0000 mg | DELAYED_RELEASE_TABLET | Freq: Every day | ORAL | Status: DC
  Administered 2022-07-22: 81 mg via ORAL
  Filled 2022-07-21: qty 1

## 2022-07-21 MED ORDER — LEVOTHYROXINE SODIUM 50 MCG PO TABS
50.0000 ug | ORAL_TABLET | ORAL | Status: DC
  Administered 2022-07-22: 50 ug via ORAL
  Filled 2022-07-21: qty 1

## 2022-07-21 MED ORDER — VERAPAMIL HCL 2.5 MG/ML IV SOLN
INTRAVENOUS | Status: AC
  Filled 2022-07-21: qty 2

## 2022-07-21 MED ORDER — HEPARIN SODIUM (PORCINE) 1000 UNIT/ML IJ SOLN
INTRAMUSCULAR | Status: DC | PRN
  Administered 2022-07-21: 7000 [IU] via INTRAVENOUS

## 2022-07-21 MED ORDER — ESCITALOPRAM OXALATE 10 MG PO TABS
5.0000 mg | ORAL_TABLET | Freq: Every morning | ORAL | Status: DC
  Administered 2022-07-22: 5 mg via ORAL
  Filled 2022-07-21: qty 1

## 2022-07-21 MED ORDER — MIDAZOLAM HCL 2 MG/2ML IJ SOLN
INTRAMUSCULAR | Status: DC | PRN
  Administered 2022-07-21: 2 mg via INTRAVENOUS

## 2022-07-21 MED ORDER — LEVOTHYROXINE SODIUM 75 MCG PO TABS: 75.0000 ug | ORAL_TABLET | ORAL | Status: DC

## 2022-07-21 MED ORDER — CLOPIDOGREL BISULFATE 75 MG PO TABS
75.0000 mg | ORAL_TABLET | Freq: Every day | ORAL | Status: DC
  Administered 2022-07-22: 75 mg via ORAL
  Filled 2022-07-21: qty 1

## 2022-07-21 MED ORDER — IRBESARTAN 150 MG PO TABS
150.0000 mg | ORAL_TABLET | Freq: Every day | ORAL | Status: DC
  Administered 2022-07-21 – 2022-07-22 (×2): 150 mg via ORAL
  Filled 2022-07-21 (×2): qty 1

## 2022-07-21 MED ORDER — POLYVINYL ALCOHOL 1.4 % OP SOLN: 1.0000 [drp] | Freq: Three times a day (TID) | OPHTHALMIC | Status: DC | PRN

## 2022-07-21 MED ORDER — SODIUM CHLORIDE 0.9 % WEIGHT BASED INFUSION
3.0000 mL/kg/h | INTRAVENOUS | Status: DC
  Administered 2022-07-21: 3 mL/kg/h via INTRAVENOUS

## 2022-07-21 MED ORDER — SODIUM CHLORIDE 0.9 % WEIGHT BASED INFUSION
1.0000 mL/kg/h | INTRAVENOUS | Status: AC
  Administered 2022-07-21 (×2): 1 mL/kg/h via INTRAVENOUS

## 2022-07-21 MED ORDER — CLOPIDOGREL BISULFATE 75 MG PO TABS: 75.0000 mg | ORAL_TABLET | ORAL | Status: DC

## 2022-07-21 MED ORDER — ASPIRIN 81 MG PO CHEW: 81.0000 mg | CHEWABLE_TABLET | ORAL | Status: DC

## 2022-07-21 MED ORDER — ISOSORBIDE MONONITRATE ER 30 MG PO TB24
30.0000 mg | ORAL_TABLET | Freq: Every day | ORAL | Status: DC
  Administered 2022-07-22: 30 mg via ORAL
  Filled 2022-07-21: qty 1

## 2022-07-21 MED ORDER — LIDOCAINE HCL (PF) 1 % IJ SOLN
INTRAMUSCULAR | Status: AC
  Filled 2022-07-21: qty 30

## 2022-07-21 MED ORDER — LIDOCAINE HCL (PF) 1 % IJ SOLN
INTRAMUSCULAR | Status: DC | PRN
  Administered 2022-07-21: 2 mL

## 2022-07-21 MED ORDER — SODIUM CHLORIDE 0.9% FLUSH
3.0000 mL | Freq: Two times a day (BID) | INTRAVENOUS | Status: DC
  Administered 2022-07-21: 3 mL via INTRAVENOUS

## 2022-07-21 MED ORDER — NITROGLYCERIN 0.4 MG SL SUBL: 0.4000 mg | SUBLINGUAL_TABLET | SUBLINGUAL | Status: DC | PRN

## 2022-07-21 MED ORDER — FENTANYL CITRATE (PF) 100 MCG/2ML IJ SOLN
INTRAMUSCULAR | Status: AC
  Filled 2022-07-21: qty 2

## 2022-07-21 MED ORDER — FENTANYL CITRATE (PF) 100 MCG/2ML IJ SOLN
INTRAMUSCULAR | Status: DC | PRN
  Administered 2022-07-21 (×2): 25 ug via INTRAVENOUS

## 2022-07-21 MED ORDER — METOPROLOL TARTRATE 25 MG PO TABS
25.0000 mg | ORAL_TABLET | Freq: Two times a day (BID) | ORAL | Status: DC
  Administered 2022-07-22: 25 mg via ORAL
  Filled 2022-07-21: qty 1

## 2022-07-21 MED ORDER — HEPARIN SODIUM (PORCINE) 1000 UNIT/ML IJ SOLN
INTRAMUSCULAR | Status: AC
  Filled 2022-07-21: qty 10

## 2022-07-21 MED ORDER — LABETALOL HCL 5 MG/ML IV SOLN: 10.0000 mg | INTRAVENOUS | Status: AC | PRN

## 2022-07-21 SURGICAL SUPPLY — 21 items
BALLN EMERGE MR 2.5X15 (BALLOONS) ×1
BALLN ~~LOC~~ EMERGE MR 3.0X12 (BALLOONS) ×1
BALLOON EMERGE MR 2.5X15 (BALLOONS) IMPLANT
BALLOON ~~LOC~~ EMERGE MR 3.0X12 (BALLOONS) IMPLANT
CATH LAUNCHER 6FR EBU3.5 (CATHETERS) IMPLANT
DEVICE RAD TR BAND REGULAR (VASCULAR PRODUCTS) IMPLANT
ELECT DEFIB PAD ADLT CADENCE (PAD) IMPLANT
GLIDESHEATH SLEND SS 6F .021 (SHEATH) IMPLANT
GUIDEWIRE INQWIRE 1.5J.035X260 (WIRE) IMPLANT
INQWIRE 1.5J .035X260CM (WIRE) ×1
KIT ENCORE 26 ADVANTAGE (KITS) IMPLANT
KIT HEART LEFT (KITS) ×1 IMPLANT
PACK CARDIAC CATHETERIZATION (CUSTOM PROCEDURE TRAY) ×1 IMPLANT
STENT SYNERGY XD 2.50X28 (Permanent Stent) IMPLANT
STENT SYNERGY XD 2.75X16 (Permanent Stent) IMPLANT
SYNERGY XD 2.50X28 (Permanent Stent) ×1 IMPLANT
SYNERGY XD 2.75X16 (Permanent Stent) ×1 IMPLANT
TRANSDUCER W/STOPCOCK (MISCELLANEOUS) ×1 IMPLANT
TUBING CIL FLEX 10 FLL-RA (TUBING) ×1 IMPLANT
VALVE GUARDIAN II ~~LOC~~ HEMO (MISCELLANEOUS) IMPLANT
WIRE COUGAR XT STRL 190CM (WIRE) IMPLANT

## 2022-07-21 NOTE — Interval H&P Note (Signed)
History and Physical Interval Note:  07/21/2022 11:42 AM  Tiffany Mayo  has presented today for surgery, with the diagnosis of CAD.  The various methods of treatment have been discussed with the patient and family. After consideration of risks, benefits and other options for treatment, the patient has consented to  Procedure(s): CORONARY STENT INTERVENTION (N/A) as a surgical intervention.  The patient's history has been reviewed, patient examined, no change in status, stable for surgery.  I have reviewed the patient's chart and labs.  Questions were answered to the patient's satisfaction.     Sherren Mocha

## 2022-07-21 NOTE — Progress Notes (Signed)
At bedside shift report, pt reports "discomfort/burning"..."not pain" 1-2/10 in center of chest that just started and feels same discomfort in upper back. Does not change with movement, or deep breath. No SOB.   Night cardiologist notified. EKG obtained. Discomfort down to 1/10 and no longer in back at time of EKG and pt reports now feels "just sore". EKG reviewed by MD and updated on discomfort. Per MD continue to monitor.

## 2022-07-22 ENCOUNTER — Encounter (HOSPITAL_COMMUNITY): Payer: Self-pay | Admitting: Cardiovascular Disease

## 2022-07-22 ENCOUNTER — Encounter: Payer: Medicare PPO | Admitting: Cardiothoracic Surgery

## 2022-07-22 DIAGNOSIS — Z7902 Long term (current) use of antithrombotics/antiplatelets: Secondary | ICD-10-CM | POA: Diagnosis not present

## 2022-07-22 DIAGNOSIS — I10 Essential (primary) hypertension: Secondary | ICD-10-CM | POA: Diagnosis not present

## 2022-07-22 DIAGNOSIS — Z7982 Long term (current) use of aspirin: Secondary | ICD-10-CM | POA: Diagnosis not present

## 2022-07-22 DIAGNOSIS — E039 Hypothyroidism, unspecified: Secondary | ICD-10-CM | POA: Diagnosis not present

## 2022-07-22 DIAGNOSIS — Z79899 Other long term (current) drug therapy: Secondary | ICD-10-CM | POA: Diagnosis not present

## 2022-07-22 DIAGNOSIS — I25119 Atherosclerotic heart disease of native coronary artery with unspecified angina pectoris: Secondary | ICD-10-CM | POA: Diagnosis not present

## 2022-07-22 DIAGNOSIS — I25118 Atherosclerotic heart disease of native coronary artery with other forms of angina pectoris: Secondary | ICD-10-CM | POA: Diagnosis not present

## 2022-07-22 LAB — BASIC METABOLIC PANEL
Anion gap: 10 (ref 5–15)
BUN: 19 mg/dL (ref 8–23)
CO2: 24 mmol/L (ref 22–32)
Calcium: 8.7 mg/dL — ABNORMAL LOW (ref 8.9–10.3)
Chloride: 106 mmol/L (ref 98–111)
Creatinine, Ser: 1.02 mg/dL — ABNORMAL HIGH (ref 0.44–1.00)
GFR, Estimated: 55 mL/min — ABNORMAL LOW (ref >60)
Glucose, Bld: 95 mg/dL (ref 70–99)
Potassium: 3.8 mmol/L (ref 3.5–5.1)
Sodium: 140 mmol/L (ref 135–145)

## 2022-07-22 LAB — CBC
HCT: 33.6 % — ABNORMAL LOW (ref 36.0–46.0)
Hemoglobin: 11.5 g/dL — ABNORMAL LOW (ref 12.0–15.0)
MCH: 33.6 pg (ref 26.0–34.0)
MCHC: 34.2 g/dL (ref 30.0–36.0)
MCV: 98.2 fL (ref 80.0–100.0)
Platelets: 159 10*3/uL (ref 150–400)
RBC: 3.42 MIL/uL — ABNORMAL LOW (ref 3.87–5.11)
RDW: 12.6 % (ref 11.5–15.5)
WBC: 5.9 10*3/uL (ref 4.0–10.5)
nRBC: 0 % (ref 0.0–0.2)

## 2022-07-22 NOTE — Progress Notes (Signed)
AVS given and reviewed with pt. Medications discussed. All questions answered to satisfaction. Pt verbalized understanding of information given. Pt escorted off the unit with all belongings to discharge lounge via wheelchair by staff member.

## 2022-07-22 NOTE — Progress Notes (Signed)
CARDIAC REHAB PHASE I   PRE:  Rate/Rhythm: 56 SR  BP:  Sitting: 165/74      SaO2: 96 RA  MODE:  Ambulation: 470 ft   POST:  Rate/Rhythm: 65 SR  BP:  Sitting: 156/67      SaO2: 97 RA  Pt ambulated in hall independently with no CP or SOB. Pt has some chest soreness,off and on, reports it does not feel like CP in the past, just achy. Returned to bed with call bell and bedside table in reach. Post stent education including site care, restrictions, heart healthy diet, antiplatelet therapy importance, exercise guidelines and CRP2 reviewed. All questions and concerns addressed. Will refer to [facility] for CRP2. Plan for home today.   [id number]  Vanessa Barbara, RN BSN 07/22/2022 9:42 AM

## 2022-07-22 NOTE — Progress Notes (Signed)
Rounding Note    Patient Name: Tiffany Mayo Date of Encounter: 07/22/2022  [facility] Cardiologist: None   Subjective   Feels well, subtle irritation to chest wall back.  Inpatient Medications    Scheduled Meds:  aspirin EC  81 mg Oral Daily   atorvastatin  80 mg Oral q AM   clopidogrel  75 mg Oral Daily   escitalopram  5 mg Oral q AM   irbesartan  150 mg Oral Daily   isosorbide mononitrate  30 mg Oral Daily   levothyroxine  50 mcg Oral QODAY   [START ON 07/23/2022] levothyroxine  75 mcg Oral QODAY   metoprolol tartrate  25 mg Oral BID   pantoprazole  20 mg Oral q AM   sodium chloride flush  3 mL Intravenous Q12H   sodium chloride flush  3 mL Intravenous Q12H   Continuous Infusions:  sodium chloride     PRN Meds: sodium chloride, acetaminophen, nitroGLYCERIN, ondansetron (ZOFRAN) IV, mouth rinse, polyvinyl alcohol, sodium chloride flush   Vital Signs    Vitals:   07/22/22 0040 07/22/22 0443 07/22/22 0457 07/22/22 0722  BP:  (!) 143/69  (!) 165/74  Pulse:  (!) 46  (!) 50  Resp:  14  12  Temp: (!) 97.5 F (36.4 C) 97.7 F (36.5 C)  97.7 F (36.5 C)  TempSrc: Oral Oral  Oral  SpO2:  95%  97%  Weight:   63 kg   Height:        Intake/Output Summary (Last 24 hours) at 07/22/2022 0908 Last data filed at 07/22/2022 0500 Gross per 24 hour  Intake 617.01 ml  Output --  Net 617.01 ml      07/22/2022    4:57 AM 07/21/2022    9:03 AM 07/20/2022    1:27 PM  Last 3 Weights  Weight (lbs) 139 lb 140 lb 140 lb 12.8 oz  Weight (kg) 63.05 kg 63.504 kg 63.866 kg      Telemetry    Sinus  rhythm no adverse arrhythmias- Personally Reviewed  ECG    No ischemia/ST segment changes sinus rhythm- Personally Reviewed  Physical Exam   GEN: No acute distress.   Neck: No JVD Cardiac: RRR, no murmurs, rubs, or gallops.  Respiratory: Clear to auscultation bilaterally. GI: Soft, nontender, non-distended  MS: No edema; No deformity. Neuro:  Nonfocal  Psych:  Normal affect   Labs    High Sensitivity Troponin:  No results for input(s): "TROPONINIHS" in the last 720 hours.   Chemistry Recent Labs  Lab 07/20/22 1415 07/22/22 0242  NA 140 140  K 4.4 3.8  CL 104 106  CO2 27 24  GLUCOSE 103* 95  BUN 20 19  CREATININE 1.04* 1.02*  CALCIUM 9.5 8.7*  GFRNONAA  --  55*  ANIONGAP  --  10    Lipids No results for input(s): "CHOL", "TRIG", "HDL", "LABVLDL", "LDLCALC", "CHOLHDL" in the last 168 hours.  Hematology Recent Labs  Lab 07/20/22 1415 07/22/22 0242  WBC 6.6 5.9  RBC 3.67* 3.42*  HGB 12.0 11.5*  HCT 36.0 33.6*  MCV 98* 98.2  MCH 32.7 33.6  MCHC 33.3 34.2  RDW 13.6 12.6  PLT 210 159   Thyroid No results for input(s): "TSH", "FREET4" in the last 168 hours.  BNPNo results for input(s): "BNP", "PROBNP" in the last 168 hours.  DDimer No results for input(s): "DDIMER" in the last 168 hours.   Radiology    CARDIAC CATHETERIZATION  Result  Date: 07/21/2022 Severe mid LAD stenosis, treated successfully with PTCA and stenting using a 2.75 x 16 mm Synergy DES Severe mid circumflex/first OM sequential stenoses, treated successfully with a single stent covering both lesions (2.5 x 28 mm Synergy DES) Recommendations: Overnight observation, DAPT with aspirin and clopidogrel 6 months without interruption.    Cardiac Studies   Cath 07/21/22: Diagnostic Dominance: Right  Intervention      Patient Profile     82 y.o. female with coronary artery disease status post LAD and circumflex stent placement by Dr. Burt Knack.  Assessment & Plan    Coronary artery disease - LAD, circumflex stent placement.  Doing well.  Yesterday evening had some mild 1 out of 10 central chest discomfort as well as upper back.  No change with breathing.  Just felt a little soreness.  Overall she is feeling better this morning. -Dual antiplatelet therapy for at least 6 months -Prior echocardiogram showed normal ejection fraction.  She has an appointment coming  up with Dr. Audie Box on the 31st.  She will be traveling to [place] to visit her son in the [place] area in mid March.  For questions or updates, please contact [place] Please consult [email or web address] for contact info under        Signed, Candee Furbish, MD  07/22/2022, 9:08 AM

## 2022-07-22 NOTE — Discharge Summary (Addendum)
Discharge Summary    Patient ID: Tiffany Mayo MRN: [id number]; DOB: [birth year]  Admit date: 07/21/2022 Discharge date: 07/22/2022  PCP:  Haywood Pao, MD   [place] Prov[facility] Cardiologist:  Evalina Field, MD   {  Discharge Diagnoses    Principal Problem:   Coronary artery disease involving native coronary artery of native heart with angina pectoris [facility]) Active Problems:   HTN (hypertension)   HLD (hyperlipidemia)   Hypothyroidism   GERD (gastroesophageal reflux disease)    Diagnostic Studies/Procedures    Left Cardiac Catheterization 07/21/2022: Severe mid LAD stenosis, treated successfully with PTCA and stenting using a 2.75 x 16 mm Synergy DES   Severe mid circumflex/first OM sequential stenoses, treated successfully with a single stent covering both lesions (2.5 x 28 mm Synergy DES)   Recommendations: Overnight observation, DAPT with aspirin and clopidogrel 6 months without interruption.  Diagnostic Dominance: Right  Intervention      _____________   History of Present Illness     Tiffany Mayo is a 82 y.o. female with CAD with severe disease of LAD and LCX on cardiac catheterization on 07/08/2022, hypertension, hyperlipidemia, hypothyroidism, and GERD who is followed by Dr. Audie Box.   Patient was referred to Dr. Audie Box in 05/2020 for for further evaluation of chest pain.  Prior echo in 03/2022 showed LVEF of 55-60% with normal wall motion and diastolic function. Coronary CTA was ordered for further evaluation and showed findings consistent with obstructive CAD in the LAD and LCx.  This led to a cardiac catheterization on 07/08/2022 showed multivessel CAD with 60% stenosis of proximal LAD followed by 80% stenosis of the proximal to mid LAD, 80% loaded by 95% stenosis of 1st Diag, 80% stenosis of proximal to mid LCx, 90% stenosis of OM 2, 30% stenosis of proximal RCA, and 80% stenosis of an RV branch.  There was added and she was referred to Dr.  Burt Knack to discuss revascularization options.  He was seen by Dr. Burt Knack on 07/20/2022 reported modest improvement in her angina since being started on Imdur but continued to describe central chest burning with any moderate level of activity.  Patient was made to proceed with PCI of LAD and Lcx and outpatient cardiac catheterization was arranged.  Hospital Course     Consultants: None  Patient presented to [facility] on 07/21/2018 for for planned cardiac catheterization as described above.  She underwent successful PTCA with DES to the mid LAD and DES to the mid LCx and OM1 (with single stent covering both lesions).  He rated the procedure well and was admitted overnight for observation.  She did report some subtle chest discomfort/irritation today but is overall doing well.  Will have her ambulate with cardiac rehab to make sure she does okay with this but is felt to be stable for discharge.  Plan for DAPT with Aspirin and Plavix for at least 6 months (she already has this at home).  Will continue home Lopressor 25 mg twice daily, Imdur 30 mg daily, and Lipitor 80 mg daily.  Patient was seen and examined by Dr. Marlou Porch today felt to be stable for discharge.  Outpatient follow-up arranged.  Medications as below.     Did the patient have an acute coronary syndrome (MI, NSTEMI, STEMI, etc) this admission?:  No  Did the patient have a percutaneous coronary intervention (stent / angioplasty)?:  Yes.     Cath/PCI Registry Performance & Quality Measures: Aspirin prescribed? - Yes ADP Receptor Inhibitor (Plavix/Clopidogrel, Brilinta/Ticagrelor or Effient/Prasugrel) prescribed (includes medically managed patients)? - Yes High Intensity Statin (Lipitor 40-'80mg'$  or Crestor 20-'40mg'$ ) prescribed? - Yes For EF <40%, was ACEI/ARB prescribed? - Not Applicable (EF >/= [id number]) For EF <40%, Aldosterone Antagonist (Spironolactone or Eplerenone) prescribed? - Not Applicable (EF >/=  [id number]) Cardiac Rehab Phase II ordered? - Yes   _____________  Discharge Vitals Blood pressure (!) 165/74, pulse (!) 50, temperature 97.7 F (36.5 C), temperature source Oral, resp. rate 12, height '5\' 1"'$  (1.549 m), weight 63 kg, SpO2 97 %.  Filed Weights   07/21/22 0903 07/22/22 0457  Weight: 63.5 kg 63 kg    Labs & Radiologic Studies    CBC Recent Labs    07/20/22 1415 07/22/22 0242  WBC 6.6 5.9  HGB 12.0 11.5*  HCT 36.0 33.6*  MCV 98* 98.2  PLT 210 [id number]   Basic Metabolic Panel Recent Labs    07/20/22 1415 07/22/22 0242  NA 140 140  K 4.4 3.8  CL 104 106  CO2 27 24  GLUCOSE 103* 95  BUN 20 19  CREATININE 1.04* 1.02*  CALCIUM 9.5 8.7*   Liver Function Tests No results for input(s): "AST", "ALT", "ALKPHOS", "BILITOT", "PROT", "ALBUMIN" in the last 72 hours. No results for input(s): "LIPASE", "AMYLASE" in the last 72 hours. High Sensitivity Troponin:   No results for input(s): "TROPONINIHS" in the last 720 hours.  BNP Invalid input(s): "POCBNP" D-Dimer No results for input(s): "DDIMER" in the last 72 hours. Hemoglobin A1C No results for input(s): "HGBA1C" in the last 72 hours. Fasting Lipid Panel No results for input(s): "CHOL", "HDL", "LDLCALC", "TRIG", "CHOLHDL", "LDLDIRECT" in the last 72 hours. Thyroid Function Tests No results for input(s): "TSH", "T4TOTAL", "T3FREE", "THYROIDAB" in the last 72 hours.  Invalid input(s): "FREET3" _____________  CARDIAC CATHETERIZATION  Result Date: 07/21/2022 Severe mid LAD stenosis, treated successfully with PTCA and stenting using a 2.75 x 16 mm Synergy DES Severe mid circumflex/first OM sequential stenoses, treated successfully with a single stent covering both lesions (2.5 x 28 mm Synergy DES) Recommendations: Overnight observation, DAPT with aspirin and clopidogrel 6 months without interruption.   CARDIAC CATHETERIZATION  Result Date: 07/08/2022   Prox RCA lesion is 30% stenosed.   RV Branch lesion is 80% stenosed.    1st Diag-1 lesion is 80% stenosed.   1st Diag-2 lesion is 95% stenosed.   Prox LAD to Mid LAD lesion is 80% stenosed.   Prox LAD lesion is 60% stenosed.   Prox Cx to Mid Cx lesion is 80% stenosed.   2nd Mrg lesion is 90% stenosed.   The left ventricular systolic function is normal.   LV end diastolic pressure is normal. Multivessel coronary artery disease with smooth 60% proximal LAD stenosis before the first diagonal vessel with 80% ostial and 95% stenosis in a small first diagonal vessel and 80% stenosis in the mid LAD; 80% proximal circumflex stenosis with 90% stenosis in the proximal mid OM branch; dominant RCA with smooth 30% mid stenosis and 80% stenosis in a small RV marginal branch. Low normal LV function with EF estimated at 50 to 55%.  LVEDP 15 mm.  There is a subtle area of distal inferior hypocontractility. RECOMMENDATION: Consider initial surgical consultation with diffuse disease in the LAD and circumflex system.  Will add isosorbide to the patient's metoprolol.  If against surgical revascularization can consider PCI to the LAD, circumflex, and circumflex marginal vessels.  Rest of lipid-lowering therapy with target LDL less than 70.  CT CORONARY MORPH W/CTA COR W/SCORE W/CA W/CM &/OR WO/CM  Addendum Date: 07/02/2022   ADDENDUM REPORT: 07/02/2022 20:29 EXAM: OVER-READ INTERPRETATION  CT CHEST The following report is an over-read performed by radiologist Dr. Lona Kettle [facility] on 07/02/2022. This over-read does not include interpretation of cardiac or coronary anatomy or pathology. The coronary CTA interpretation by the cardiologist is attached. COMPARISON:  None. FINDINGS: No filling defects identified in pulmonary arteries to indicate PE. Dependent bibasilar subsegmental atelectasis. No pulmonary edema or evidence of pneumonia. No suspicious adenopathy identified. No pneumothorax or pleural effusion. Imaged osseous structures are intact. Imaged portions of the upper abdomen  were unremarkable. Partially imaged lumbar spine fixation hardware noted on the scout view. IMPRESSION: Dependent bibasilar subsegmental atelectasis. Otherwise no significant extracardiac findings identified. Electronically Signed   By: Sammie Bench M.D.   On: 07/02/2022 20:29   Result Date: 07/02/2022 CLINICAL DATA:  78F with hypertension, hyperlipidemia, TIA and chest pain. EXAM: Cardiac/Coronary  CT TECHNIQUE: The patient was scanned on a [employer]. FINDINGS: A 120 kV prospective scan was triggered in the descending thoracic aorta at 111 HU's. Axial non-contrast 3 mm slices were carried out through the heart. The data set was analyzed on a dedicated work station and scored using the [employer]. Gantry rotation speed was 250 msecs and collimation was .6 mm. No beta blockade and 0.8 mg of sl NTG was given. The 3D data set was reconstructed in 5% intervals of the 67-82 % of the R-R cycle. Diastolic phases were analyzed on a dedicated work station using MPR, MIP and VRT modes. The patient received 80 cc of contrast. Aorta: Normal size. Ascending aorta 3.0 cm. Aortic atherosclerosis. No dissection. Aortic Valve:  Trileaflet.  Aortic valve calcification. Coronary Arteries:  Normal coronary origin.  Right dominance. RCA is a large dominant artery that gives rise to PDA and PLVB. There is mild (25%) plaque in the proximal and mid RCA and moderate (50-69%) mixed plaque distally. Left main is a large artery that gives rise to LAD and LCX arteries. LAD is a large vessel that has minimal (<25%) calcified plaque proximally. There is moderate (50-69%) calcified plaque distal to D1 and severe (>70%) plaque distal to D2. D1 has moderate mixed plaque at the ostium. LCX is a non-dominant artery tthat has mild (25-49%) mixed plaque proximally and severe (>70%) mixed plaque in the mid vessel. Coronary Calcium Score: Left main: 7.32 Left anterior descending artery: 317 Left circumflex artery: 47.6 Right coronary  artery: 173 Total: 544 Percentile: 80th Other findings: Normal pulmonary vein drainage into the left atrium. Normal let atrial appendage without a thrombus. Normal size of the pulmonary artery. IMPRESSION: 1. Coronary calcium score of 544. This was 80th percentile for age-, race-, and sex-matched controls. 2. Normal coronary origin with right dominance. 3. There is severe (>70%) plaque in the LCX and LAD. There is moderate (50-69%) stenosis in the RCA. CAD-RADS 4. 4.  Will send study for FFRct. 5.  Aortic atherosclerosis. 6.  Aortic valve calcification. Skeet Latch, MD Electronically Signed: By: Skeet Latch M.D. On: 07/01/2022 17:51   CT CORONARY FRACTIONAL FLOW RESERVE FLUID ANALYSIS  Result Date: 07/01/2022 EXAM: FFRCT ANALYSIS for abnormal coronary CT-A. FINDINGS: FFRct analysis was performed on the original cardiac CT angiogram dataset. Diagrammatic representation of the FFRct analysis is provided  in a separate PDF document in PACS. This dictation was created using the PDF document and an interactive 3D model of the results. 3D model is not available in the EMR/PACS. Normal FFR range is >0.80. 1. Left Main: FFRct 1.0 2. LAD: FFRct 0.99 proximal, 0.67 mid 3. [id number] 1.0 proximal, 0.69 mid 4. RCA: FFRct 0.99 proximal, 0.88 mid, 0.84 distal IMPRESSION: 1. FFRct findings are consistent with obstructive coronary disease in the LAD and LCX. Recommend cardiac catheterization. Tiffany C. Oval Linsey, MD Electronically Signed   By: Skeet Latch M.D.   On: 07/01/2022 18:01   Disposition   Patient is being discharged home today in good condition.  Follow-up Plans & Appointments     Follow-up Information     O'Neal, Cassie Freer, MD Follow up.   Specialties: Cardiology, Internal Medicine, Radiology Why: Please keep follow-up visit with Dr. Audie Box on 07/30/2022 at 9:00am. Contact information: [place] [address] [phone]                Discharge Instructions      AMB Referral to Cardiac Rehabilitation - Phase II   Complete by: As directed    Diagnosis: Coronary Stents   After initial evaluation and assessments completed: Virtual Based Care may be provided alone or in conjunction with Phase 2 Cardiac Rehab based on patient barriers.: Yes   Intensive Cardiac Rehabilitation (ICR) [place] location only OR Traditional Cardiac Rehabilitation (TCR) *If criteria for ICR are not met will enroll in TCR [facility] only): Yes   Diet - low sodium heart healthy   Complete by: As directed    Increase activity slowly   Complete by: As directed         Discharge Medications   Allergies as of 07/22/2022       Reactions   Latex Rash   Codeine Nausea And Vomiting   Sulfa Antibiotics Nausea And Vomiting        Medication List     TAKE these medications    acetaminophen 500 MG tablet Commonly known as: TYLENOL Take 500-1,000 mg by mouth every 6 (six) hours as needed (pain.).   AIRBORNE PO Take 3 tablets by mouth daily.   aspirin EC 81 MG tablet Take 1 tablet (81 mg total) by mouth daily. Swallow whole.   atorvastatin 80 MG tablet Commonly known as: LIPITOR Take 80 mg by mouth in the morning.   clopidogrel 75 MG tablet Commonly known as: Plavix Take 1 tablet (75 mg total) by mouth daily.   escitalopram 5 MG tablet Commonly known as: LEXAPRO Take 5 mg by mouth in the morning.   isosorbide mononitrate 30 MG 24 hr tablet Commonly known as: IMDUR Take 1 tablet (30 mg total) by mouth daily.   levothyroxine 50 MCG tablet Commonly known as: SYNTHROID Take 50 mcg by mouth every other day.   levothyroxine 75 MCG tablet Commonly known as: SYNTHROID Take 75 mcg by mouth every other day.   metoprolol tartrate 25 MG tablet Commonly known as: LOPRESSOR Take 1 tablet (25 mg total) by mouth 2 (two) times daily.   nitroGLYCERIN 0.4 MG SL tablet Commonly known as: NITROSTAT Place 1 tablet (0.4 mg total) under the tongue every 5 (five) minutes as  needed for chest pain.   olmesartan 20 MG tablet Commonly known as: BENICAR Take 20 mg by mouth in the morning.   pantoprazole 20 MG tablet Commonly known as: PROTONIX Take 20 mg by mouth in the morning.   Systane Ultra 0.4-0.3 %  Soln Generic drug: Polyethyl Glycol-Propyl Glycol Place 1-2 drops into both eyes 3 (three) times daily as needed (dry/irritated eyes.).           Outstanding Labs/Studies   N/A.  Duration of Discharge Encounter   Greater than 30 minutes including physician time.  Signed, Darreld Mclean, PA-C 07/22/2022, 9:31 AM  Personally seen and examined. Agree with above.   Subjective    Feels well, subtle irritation to chest wall back.   Inpatient Medications    Scheduled Meds:  aspirin EC  81 mg Oral Daily   atorvastatin  80 mg Oral q AM   clopidogrel  75 mg Oral Daily   escitalopram  5 mg Oral q AM   irbesartan  150 mg Oral Daily   isosorbide mononitrate  30 mg Oral Daily   levothyroxine  50 mcg Oral QODAY   [START ON 07/23/2022] levothyroxine  75 mcg Oral QODAY   metoprolol tartrate  25 mg Oral BID   pantoprazole  20 mg Oral q AM   sodium chloride flush  3 mL Intravenous Q12H   sodium chloride flush  3 mL Intravenous Q12H    Continuous Infusions:  sodium chloride      PRN Meds: sodium chloride, acetaminophen, nitroGLYCERIN, ondansetron (ZOFRAN) IV, mouth rinse, polyvinyl alcohol, sodium chloride flush    Vital Signs          Vitals:    07/22/22 0040 07/22/22 0443 07/22/22 0457 07/22/22 0722  BP:   (!) 143/69   (!) 165/74  Pulse:   (!) 46   (!) 50  Resp:   14   12  Temp: (!) 97.5 F (36.4 C) 97.7 F (36.5 C)   97.7 F (36.5 C)  TempSrc: Oral Oral   Oral  SpO2:   95%   97%  Weight:     63 kg    Height:              Intake/Output Summary (Last 24 hours) at 07/22/2022 0908 Last data filed at 07/22/2022 0500    Gross per 24 hour  Intake 617.01 ml  Output --  Net 617.01 ml        07/22/2022    4:57 AM 07/21/2022    9:03  AM 07/20/2022    1:27 PM  Last 3 Weights  Weight (lbs) 139 lb 140 lb 140 lb 12.8 oz  Weight (kg) 63.05 kg 63.504 kg 63.866 kg       Telemetry    Sinus  rhythm no adverse arrhythmias- Personally Reviewed   ECG    No ischemia/ST segment changes sinus rhythm- Personally Reviewed   Physical Exam    GEN: No acute distress.   Neck: No JVD Cardiac: RRR, no murmurs, rubs, or gallops.  Respiratory: Clear to auscultation bilaterally. GI: Soft, nontender, non-distended  MS: No edema; No deformity. Neuro:  Nonfocal  Psych: Normal affect    Labs    High Sensitivity Troponin:   Last Labs  No results for input(s): "TROPONINIHS" in the last 720 hours.     Chemistry Last Labs      Recent Labs  Lab 07/20/22 1415 07/22/22 0242  NA 140 140  K 4.4 3.8  CL 104 106  CO2 27 24  GLUCOSE 103* 95  BUN 20 19  CREATININE 1.04* 1.02*  CALCIUM 9.5 8.7*  GFRNONAA  --  55*  ANIONGAP  --  10      Lipids  Last Labs  No results for input(s): "  CHOL", "TRIG", "HDL", "LABVLDL", "LDLCALC", "CHOLHDL" in the last 168 hours.    Hematology Last Labs      Recent Labs  Lab 07/20/22 1415 07/22/22 0242  WBC 6.6 5.9  RBC 3.67* 3.42*  HGB 12.0 11.5*  HCT 36.0 33.6*  MCV 98* 98.2  MCH 32.7 33.6  MCHC 33.3 34.2  RDW 13.6 12.6  PLT 210 159      Thyroid  Last Labs  No results for input(s): "TSH", "FREET4" in the last 168 hours.    BNP Last Labs  No results for input(s): "BNP", "PROBNP" in the last 168 hours.    DDimer  Last Labs  No results for input(s): "DDIMER" in the last 168 hours.      Radiology     Imaging Results (Last 48 hours)  CARDIAC CATHETERIZATION   Result Date: 07/21/2022 Severe mid LAD stenosis, treated successfully with PTCA and stenting using a 2.75 x 16 mm Synergy DES Severe mid circumflex/first OM sequential stenoses, treated successfully with a single stent covering both lesions (2.5 x 28 mm Synergy DES) Recommendations: Overnight observation, DAPT with  aspirin and clopidogrel 6 months without interruption.       Cardiac Studies    Cath 07/21/22: Diagnostic Dominance: Right  Intervention        Patient Profile     82 y.o. female with coronary artery disease status post LAD and circumflex stent placement by Dr. Burt Knack.   Assessment & Plan    Coronary artery disease - LAD, circumflex stent placement.  Doing well.  Yesterday evening had some mild 1 out of 10 central chest discomfort as well as upper back.  No change with breathing.  Just felt a little soreness.  Overall she is feeling better this morning. -Dual antiplatelet therapy for at least 6 months -Prior echocardiogram showed normal ejection fraction.   She has an appointment coming up with Dr. Audie Box on the 31st.   She will be traveling to [place] to visit her son in the [place] area in mid March.  Candee Furbish, MD

## 2022-07-22 NOTE — Discharge Instructions (Signed)
Post Cardiac Catheterization: NO HEAVY LIFTING OR SEXUAL ACTIVITY X 7 DAYS. NO DRIVING X 3-5 DAYS. NO SOAKING BATHS, HOT TUBS, POOLS, ETC., X 7 DAYS.  Radial Site Care: Refer to this sheet in the next few weeks. These instructions provide you with information on caring for yourself after your procedure. Your caregiver may also give you more specific instructions. Your treatment has been planned according to current medical practices, but problems sometimes occur. Call your caregiver if you have any problems or questions after your procedure. HOME CARE INSTRUCTIONS  You may shower the day after the procedure.Remove the bandage (dressing) and gently wash the site with plain soap and water.Gently pat the site dry.   Do not apply powder or lotion to the site.   Do not submerge the affected site in water for 3 to 5 days.   Inspect the site at least twice daily.   Do not flex or bend the affected arm for 24 hours.   No lifting over 5 pounds (2.3 kg) for 5 days after your procedure.   Do not drive home if you are discharged the same day of the procedure. Have someone else drive you.  What to expect:  Any bruising will usually fade within 1 to 2 weeks.   Blood that collects in the tissue (hematoma) may be painful to the touch. It should usually decrease in size and tenderness within 1 to 2 weeks.  SEEK IMMEDIATE MEDICAL CARE IF:  You have unusual pain at the radial site.   You have redness, warmth, swelling, or pain at the radial site.   You have drainage (other than a small amount of blood on the dressing).   You have chills.   You have a fever or persistent symptoms for more than 72 hours.   You have a fever and your symptoms suddenly get worse.   Your arm becomes pale, cool, tingly, or numb.   You have heavy bleeding from the site. Hold pressure on the site.    

## 2022-07-22 NOTE — Care Management (Signed)
  Transition of Care (TOC) Screening Note   Patient Details  Name: Tiffany Mayo Date of Birth: June 27, 1940   Transition of Care [facility]) CM/SW Contact:    Bethena Roys, RN Phone Number: 07/22/2022, 10:29 AM    Transition of Care Department [facility]) has reviewed the patient and no TOC needs have been identified at this time. Patient will transition home on Plavix. No home needs identified at this time.

## 2022-07-23 LAB — LIPOPROTEIN A (LPA): Lipoprotein (a): 53.6 nmol/L — ABNORMAL HIGH (ref <75.0)

## 2022-07-27 NOTE — Progress Notes (Unsigned)
Patient: Tiffany Mayo Date of Birth: [birth year]  Reason for Visit: Follow up History from: Patient Primary Neurologist: Tiffany Mayo  ASSESSMENT AND PLAN 82 y.o. 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  Atypical migraine or focal seizures less likely.  Vascular risk factors of hypertension and hyperlipidemia.  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 On 07/21/22 had cardiac cath with 2 stent placement.  She has done well so far.  -Recheck CTA head and neck for surveillance of incidental 2 mm terminal left ICA aneurysm around May 2024, this is 6 months from initial study in November 2023, if stable will check annually 2-3 years -Discussed importance of management of vascular risk factors for secondary stroke prevention to include BP less than 130/90, A1c less than 6.5, LDL less than 70 -Neurology had recommended aspirin 81 mg daily, currently on Plavix as well post stent, duration per cardiology -Schedule I year follow-up  HISTORY OF PRESENT ILLNESS: Today 07/28/22 Here today for follow up, 04/08/22 3 hour numbness to left fingers, arm, neck, face. Was told TIA. Admitted 07/21/22 for 2 cardiac stent placement. She has done well. Is on aspirin 81 mg and Plavix 75 mg post stent placement, duration per cardiology.  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  Her BP looks good 124/67 on olmesartan, metoprolol, Imdur.  She is on Lipitor 80 mg daily.  Her husband has some health issues.  She is independent and drives.  She looks quite good today.  Does note occasionally a side-to-side head tremor, has been going on for awhile before TIA.  HISTORY  04/27/22 Dr. Leonie Mayo HPI: Tiffany Mayo is a 82 year old pleasant Caucasian  lady seen today for initial office consultation visit for TIA.  History is obtained from the patient and review of electronic medical records and I personally reviewed pertinent available imaging films in PACS.  She has past medical history of hypertension, hyperlipidemia, hypothyroidism and gastroesophageal reflux disease.  He presented on 04/08/2022 for evaluation for a 3-hour episode of left fingertip gradually spread to involve left arm, , left side of the neck as well as face.  She denies any accompanying slurred speech , mass of the difficulty.  She denied any accompanying headache, slurred speech or gait difficulty.  She fell asleep and next day next day when she woke up in morning the numbness was gone.  She has had no recurrent episodes since then.  She denies any prior history of TIA, stroke or significant neurological problems.  He has no history of migraine headaches.  She admits she has been under significant stress recently since her husband diagnosed with memory problems and possible dementia.  Patient underwent MRI of The Brain which did not show any acute abnormality.  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  She has no family history of ruptured brain aneurysm he has risk factors of uncontrolled hypertension, smoking known prior aneurysms in the brain.  LDL cholesterol was 138 mg percent and hemoglobin A1c was 5.0.  Patient was commended to take Lipitor 40  mg daily but her primary care physician has changed her to taking it 3 days a week instead now that she is tolerating well without bruising or bleeding..  Blood pressure in the good control today it is 137/64.Marland Kitchen   REVIEW OF SYSTEMS: Out of a complete 14 system review of symptoms, the patient complains only of the following  symptoms, and all other reviewed systems are negative.  See HPI  ALLERGIES: Allergies  Allergen Reactions   Latex Rash   Codeine Nausea And Vomiting   Sulfa Antibiotics Nausea And Vomiting    HOME MEDICATIONS: Outpatient Medications Prior to Visit  Medication Sig Dispense Refill   acetaminophen (TYLENOL) 500 MG tablet Take 500-1,000 mg by mouth every 6 (six) hours as needed (pain.).     aspirin EC 81 MG tablet Take 1 tablet (81 mg total) by mouth daily. Swallow whole. 30 tablet 11   atorvastatin (LIPITOR) 80 MG tablet Take 80 mg by mouth in the morning.     clopidogrel (PLAVIX) 75 MG tablet Take 1 tablet (75 mg total) by mouth daily. 30 tablet 11   escitalopram (LEXAPRO) 5 MG tablet Take 5 mg by mouth in the morning.     isosorbide mononitrate (IMDUR) 30 MG 24 hr tablet Take 1 tablet (30 mg total) by mouth daily. 30 tablet 11   levothyroxine (SYNTHROID) 50 MCG tablet Take 50 mcg by mouth every other day.     levothyroxine (SYNTHROID) 75 MCG tablet Take 75 mcg by mouth every other day.     metoprolol tartrate (LOPRESSOR) 25 MG tablet Take 1 tablet (25 mg total) by mouth 2 (two) times daily. 180 tablet 3   Multiple Vitamins-Minerals (AIRBORNE PO) Take 3 tablets by mouth daily.     nitroGLYCERIN (NITROSTAT) 0.4 MG SL tablet Place 1 tablet (0.4 mg total) under the tongue every 5 (five) minutes as needed for chest pain. 90 tablet 3   olmesartan (BENICAR) 20 MG tablet Take 20 mg by mouth in the morning.     pantoprazole (PROTONIX) 20 MG tablet Take 20 mg by mouth in the morning.     Polyethyl Glycol-Propyl Glycol (SYSTANE ULTRA) 0.4-0.3 % SOLN Place 1-2 drops into both eyes 3 (three) times daily as needed (dry/irritated eyes.).     No facility-administered medications prior to visit.    PAST MEDICAL HISTORY: Past Medical History:  Diagnosis Date   Arthritis    spine, neck   GERD (gastroesophageal reflux disease)    Headache    SINUS    Hemorrhoid    Hyperlipemia    Hypertension     Hypothyroidism     PAST SURGICAL HISTORY: Past Surgical History:  Procedure Laterality Date   ABDOMINAL HYSTERECTOMY  1990   ANTERIOR CERVICAL DECOMP/DISCECTOMY FUSION  04/03/2012   Procedure: ANTERIOR CERVICAL DECOMPRESSION/DISCECTOMY FUSION 1 LEVEL;  Surgeon: Hosie Spangle, MD;  Location: [facility];  Service: Neurosurgery;  Laterality: N/A;  Cervical Five-Six Anterior Cervical Decompression with Fusion Plating and Bonegraft   BUNIONECTOMY     RIGHT FOOT   CORONARY STENT INTERVENTION N/A 07/21/2022   Procedure: CORONARY STENT INTERVENTION;  Surgeon: Sherren Mocha, MD;  Location: [facility];  Service: Cardiovascular;  Laterality: N/A;   EYE SURGERY     Lasik, Catarct   GANGLION CYST EXCISION     BIL WRIST    JOINT REPLACEMENT     Right middle finger   LEFT HEART CATH AND CORONARY ANGIOGRAPHY N/A 07/08/2022   Procedure: LEFT HEART CATH AND  CORONARY ANGIOGRAPHY;  Surgeon: Troy Sine, MD;  Location: [facility];  Service: Cardiovascular;  Laterality: N/A;   TRIGGER FINGER RELEASE     thumbs    FAMILY HISTORY: Family History  Problem Relation Age of Onset   Heart disease Father     SOCIAL HISTORY: Social History   Socioeconomic History   Marital status: Married    Spouse name: Not on file   Number of children: 2   Years of education: Not on file   Highest education level: Not on file  Occupational History   Occupation: Pharmacist, 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  Tobacco Use   Smoking status: Never   Smokeless tobacco: Never  Substance and Sexual Activity   Alcohol use: Yes    Comment: OCC WINE    Drug use: No   Sexual activity: Not on file  Other Topics Concern   Not on file  Social History Narrative   Not on file   Social Determinants of Health   Financial Resource Strain: Not on file  Food Insecurity: No Food Insecurity (07/21/2022)   Hunger Vital Sign    Worried About Running Out of Food in the Last Year: Never true    Ran Out of Food in the  Last Year: Never true  Transportation Needs: No Transportation Needs (07/21/2022)   PRAPARE - Hydrologist (Medical): No    Lack of Transportation (Non-Medical): No  Physical Activity: Not on file  Stress: Not on file  Social Connections: Not on file  Intimate Partner Violence: Not At Risk (07/21/2022)   Humiliation, Afraid, Rape, and Kick questionnaire    Fear of Current or Ex-Partner: No    Emotionally Abused: No    Physically Abused: No    Sexually Abused: No   PHYSICAL EXAM  Vitals:   07/28/22 1112  BP: 124/67  Pulse: (!) 56  Weight: 139 lb (63 kg)  Height: '5\' 1"'$  (1.549 m)   Body mass index is 26.26 kg/m.  Generalized: Well developed, in no acute distress  Neurological examination  Mentation: Alert oriented to time, place, history taking. Follows all commands speech and language fluent Cranial nerve II-XII: Pupils were equal round reactive to light. Extraocular movements were full, visual field were full on confrontational test. Facial sensation and strength were normal. Head turning and shoulder shrug  were normal and symmetric. Intermittent side to side head tremor notes, is fine/mild Motor: The motor testing reveals 5 over 5 strength of all 4 extremities. Good symmetric motor tone is noted throughout.  Sensory: Sensory testing is intact to soft touch on all 4 extremities. No evidence of extinction is noted.  Coordination: Cerebellar testing reveals good finger-nose-finger and heel-to-shin bilaterally.  Gait and station: Gait is normal. Tandem gait is normal.  Reflexes: Deep tendon reflexes are symmetric and normal bilaterally.   DIAGNOSTIC DATA (LABS, IMAGING, TESTING) - I reviewed patient records, labs, notes, testing and imaging myself where available.  Lab Results  Component Value Date   WBC 5.9 07/22/2022   HGB 11.5 (L) 07/22/2022   HCT 33.6 (L) 07/22/2022   MCV 98.2 07/22/2022   PLT 159 07/22/2022      Component Value Date/Time    NA 140 07/22/2022 0242   NA 140 07/20/2022 1415   K 3.8 07/22/2022 0242   CL 106 07/22/2022 0242   CO2 24 07/22/2022 0242   GLUCOSE 95 07/22/2022 0242   BUN 19 07/22/2022 0242   BUN 20 07/20/2022 1415  CREATININE 1.02 (H) 07/22/2022 0242   CALCIUM 8.7 (L) 07/22/2022 0242   PROT 7.3 04/07/2022 1921   ALBUMIN 4.4 04/07/2022 1921   AST 20 04/07/2022 1921   ALT 14 04/07/2022 1921   ALKPHOS 47 04/07/2022 1921   BILITOT 0.4 04/07/2022 1921   GFRNONAA 55 (L) 07/22/2022 0242   GFRAA >60 08/04/2016 1833   Lab Results  Component Value Date   CHOL 203 (H) 04/08/2022   HDL 44 04/08/2022   LDLCALC 138 (H) 04/08/2022   TRIG 104 04/08/2022   CHOLHDL 4.6 04/08/2022   Lab Results  Component Value Date   HGBA1C 5.0 04/08/2022   No results found for: "VITAMINB12" Lab Results  Component Value Date   TSH 4.968 (H) 04/08/2022    Butler Denmark, AGNP-C, DNP 07/28/2022, 1:55 PM [facility] [address] [place], [place] [address] [phone]

## 2022-07-28 ENCOUNTER — Encounter: Payer: Self-pay | Admitting: Neurology

## 2022-07-28 ENCOUNTER — Ambulatory Visit: Payer: Medicare PPO | Admitting: Neurology

## 2022-07-28 VITALS — BP 124/67 | HR 56 | Ht 61.0 in | Wt 139.0 lb

## 2022-07-28 DIAGNOSIS — G459 Transient cerebral ischemic attack, unspecified: Secondary | ICD-10-CM

## 2022-07-28 DIAGNOSIS — E785 Hyperlipidemia, unspecified: Secondary | ICD-10-CM | POA: Diagnosis not present

## 2022-07-28 DIAGNOSIS — I10 Essential (primary) hypertension: Secondary | ICD-10-CM | POA: Diagnosis not present

## 2022-07-28 NOTE — Patient Instructions (Addendum)
Recheck CTA head and neck aneurysm around May 2024, I will contact you   Continue management of vascular risk factors, Keep BP < 130/90, LDL < 70, A1C < 7.0

## 2022-07-29 NOTE — Progress Notes (Signed)
Cardiology Office Note:   Date:  07/30/2022  NAME:  Tiffany Mayo    MRN: [id number] DOB:  November 30, 1940   PCP:  Haywood Pao, MD  Cardiologist:  Evalina Field, MD  Electrophysiologist:  None   Referring MD: Haywood Pao, MD   Chief Complaint  Patient presents with   Follow-up         History of Present Illness:   Tiffany Mayo is a 82 y.o. female with a hx of CAD who presents for follow-up.  She reports she is doing well.  Still noticing some tightness in her chest with activity.  Symptoms are improving.  She reports low energy.  She is noticeably bradycardic.  EKG unremarkable.  Blood pressure also a bit too low.  She is on Imdur and metoprolol.  Likely both can be stopped.  She is on a statin.  Will need to recheck her lipids in the next few months.  She is going to do cardiac rehab.  Denies any significant shortness of breath.  Currently on DAPT.  Plans to go back to [place] in the next few weeks.  Problem List CAD -60% pLAD -mid LAD 80% -> PCI 07/21/2022 -LCX/OM1 90% -> PCI 07/21/2022 2. HLD  -T chol 203, HDL 44, LDL 138, TG 104 3. TIA  Past Medical History: Past Medical History:  Diagnosis Date   Arthritis    spine, neck   Coronary artery disease    GERD (gastroesophageal reflux disease)    Headache    SINUS    Hemorrhoid    Hyperlipemia    Hypertension    Hypothyroidism     Past Surgical History: Past Surgical History:  Procedure Laterality Date   ABDOMINAL HYSTERECTOMY  1990   ANTERIOR CERVICAL DECOMP/DISCECTOMY FUSION  04/03/2012   Procedure: ANTERIOR CERVICAL DECOMPRESSION/DISCECTOMY FUSION 1 LEVEL;  Surgeon: Hosie Spangle, MD;  Location: [facility];  Service: Neurosurgery;  Laterality: N/A;  Cervical Five-Six Anterior Cervical Decompression with Fusion Plating and Bonegraft   BUNIONECTOMY     RIGHT FOOT   CARDIAC CATHETERIZATION     CORONARY STENT INTERVENTION N/A 07/21/2022   Procedure: CORONARY STENT INTERVENTION;  Surgeon: Sherren Mocha, MD;  Location: [facility];  Service: Cardiovascular;  Laterality: N/A;   EYE SURGERY     Lasik, Catarct   GANGLION CYST EXCISION     BIL WRIST    JOINT REPLACEMENT     Right middle finger   LEFT HEART CATH AND CORONARY ANGIOGRAPHY N/A 07/08/2022   Procedure: LEFT HEART CATH AND CORONARY ANGIOGRAPHY;  Surgeon: Troy Sine, MD;  Location: [facility];  Service: Cardiovascular;  Laterality: N/A;   TRIGGER FINGER RELEASE     thumbs    Current Medications: Current Meds  Medication Sig   acetaminophen (TYLENOL) 500 MG tablet Take 500-1,000 mg by mouth every 6 (six) hours as needed (pain.).   aspirin EC 81 MG tablet Take 1 tablet (81 mg total) by mouth daily. Swallow whole.   atorvastatin (LIPITOR) 80 MG tablet Take 80 mg by mouth in the morning.   clopidogrel (PLAVIX) 75 MG tablet Take 1 tablet (75 mg total) by mouth daily.   escitalopram (LEXAPRO) 5 MG tablet Take 5 mg by mouth in the morning.   levothyroxine (SYNTHROID) 50 MCG tablet Take 50 mcg by mouth every other day.   levothyroxine (SYNTHROID) 75 MCG tablet Take 75 mcg by mouth every other day.   Multiple Vitamins-Minerals (AIRBORNE PO)  Take 3 tablets by mouth daily.   nitroGLYCERIN (NITROSTAT) 0.4 MG SL tablet Place 1 tablet (0.4 mg total) under the tongue every 5 (five) minutes as needed for chest pain.   olmesartan (BENICAR) 20 MG tablet Take 20 mg by mouth in the morning.   pantoprazole (PROTONIX) 20 MG tablet Take 20 mg by mouth in the morning.   Polyethyl Glycol-Propyl Glycol (SYSTANE ULTRA) 0.4-0.3 % SOLN Place 1-2 drops into both eyes 3 (three) times daily as needed (dry/irritated eyes.).   [DISCONTINUED] isosorbide mononitrate (IMDUR) 30 MG 24 hr tablet Take 1 tablet (30 mg total) by mouth daily.   [DISCONTINUED] metoprolol tartrate (LOPRESSOR) 25 MG tablet Take 1 tablet (25 mg total) by mouth 2 (two) times daily.     Allergies:    Latex, Codeine, and Sulfa antibiotics   Social History: Social  History   Socioeconomic History   Marital status: Married    Spouse name: Not on file   Number of children: 2   Years of education: Not on file   Highest education level: Not on file  Occupational History   Occupation: Pharmacist, 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  Tobacco Use   Smoking status: Never   Smokeless tobacco: Never  Substance and Sexual Activity   Alcohol use: Yes    Comment: OCC WINE    Drug use: No   Sexual activity: Not on file  Other Topics Concern   Not on file  Social History Narrative   Not on file   Social Determinants of Health   Financial Resource Strain: Not on file  Food Insecurity: No Food Insecurity (07/21/2022)   Hunger Vital Sign    Worried About Running Out of Food in the Last Year: Never true    Ran Out of Food in the Last Year: Never true  Transportation Needs: No Transportation Needs (07/21/2022)   PRAPARE - Hydrologist (Medical): No    Lack of Transportation (Non-Medical): No  Physical Activity: Not on file  Stress: Not on file  Social Connections: Not on file     Family History: The patient's family history includes Heart disease in her father.  ROS:   All other ROS reviewed and negative. Pertinent positives noted in the HPI.     EKGs/Labs/Other Studies Reviewed:   The following studies were personally reviewed by me today:  EKG:  EKG is ordered today.  The ekg ordered today demonstrates SB 52, no acute changes, and was personally reviewed by me.   Recent Labs: 04/07/2022: ALT 14 04/08/2022: TSH 4.968 07/22/2022: BUN 19; Creatinine, Ser 1.02; Hemoglobin 11.5; Platelets 159; Potassium 3.8; Sodium 140   Recent Lipid Panel    Component Value Date/Time   CHOL 203 (H) 04/08/2022 0000   TRIG 104 04/08/2022 0000   HDL 44 04/08/2022 0000   CHOLHDL 4.6 04/08/2022 0000   VLDL 21 04/08/2022 0000   LDLCALC 138 (H) 04/08/2022 0000    Physical Exam:   VS:  BP 116/60   Pulse (!) 52   Ht '5\' 1"'$  (1.549 m)   Wt 137 lb 9.6 oz  (62.4 kg)   SpO2 97%   BMI 26.00 kg/m    Wt Readings from Last 3 Encounters:  07/30/22 137 lb 9.6 oz (62.4 kg)  07/28/22 139 lb (63 kg)  07/22/22 139 lb (63 kg)    General: Well nourished, well developed, in no acute distress Head: Atraumatic, normal size  Eyes: PEERLA, EOMI  Neck: Supple, no JVD Endocrine:  No thryomegaly Cardiac: Normal S1, S2; RRR; no murmurs, rubs, or gallops Lungs: Clear to auscultation bilaterally, no wheezing, rhonchi or rales  Abd: Soft, nontender, no hepatomegaly  Ext: No edema, pulses 2+ Musculoskeletal: No deformities, BUE and BLE strength normal and equal Skin: Warm and dry, no rashes   Neuro: Alert and oriented to person, place, time, and situation, CNII-XII grossly intact, no focal deficits  Psych: Normal mood and affect   ASSESSMENT:   KIEANNA WIRKKALA is a 82 y.o. female who presents for the following: 1. Coronary artery disease of native artery of native heart with stable angina pectoris ([place])   2. Mixed hyperlipidemia     PLAN:   1. Coronary artery disease of native artery of native heart with stable angina pectoris ([place]) 2. Mixed hyperlipidemia -Status post PCI to the mid LAD and circumflex OM1 on 07/21/2022.  We will continue with 6 months of DAPT.  Stressed the importance of taking her medications.  She is on aspirin and Plavix. -She is on Lipitor 80 mg daily.  She will come back in 3 months to recheck lipids.  Goal LDL cholesterol less than 55. -She is a bit dizzy and lightheaded.  She is on too much medication.  Wean off metoprolol to tartrate.  25 mg daily and stop.  Stop Imdur 30 mg daily.  No need given revascularization. -Echo shows normal LV function.  She does have a 60% proximal LAD lesion that was negative by CT FFR.  We will manage this medically.  She will see me back in 3 months to discuss further.  She will do cardiac rehab.  Exercise was encouraged.     Disposition: Return in about 3 months (around 10/30/2022).  Medication  Adjustments/Labs and Tests Ordered: Current medicines are reviewed at length with the patient today.  Concerns regarding medicines are outlined above.  Orders Placed This Encounter  Procedures   Lipid panel   EKG 12-Lead   No orders of the defined types were placed in this encounter.   Patient Instructions   Medication Instructions:  TAKE Metoprolol 1 tablet daily for 3 days, and then stop  STOP Imdur   *If you need a refill on your cardiac medications before your next appointment, please call your pharmacy*   Lab Work: LIPID (1 week before follow up appointment in 3 months)  If you have labs (blood work) drawn today and your tests are completely normal, you will receive your results only by: [place] (if you have MyChart) OR A paper copy in the mail If you have any lab test that is abnormal or we need to change your treatment, we will call you to review the results.   Follow-Up: At [facility], you and your health needs are our priority.  As part of our continuing mission to provide you with exceptional heart care, we have created designated Provider Care Teams.  These Care Teams include your primary Cardiologist (physician) and Advanced Practice Providers (APPs -  Physician Assistants and Nurse Practitioners) who all work together to provide you with the care you need, when you need it.  We recommend signing up for the patient portal called "MyChart".  Sign up information is provided on this After Visit Summary.  MyChart is used to connect with patients for Virtual Visits (Telemedicine).  Patients are able to view lab/test results, encounter notes, upcoming appointments, etc.  Non-urgent messages can be sent to your provider as well.   To learn more about what  you can do with MyChart, go to [email or web address].    Your next appointment:   3 month(s) (labs 1 week before)  Provider:   Evalina Field, MD        Time Spent with Patient: I have spent a  total of 35 minutes with patient reviewing hospital notes, telemetry, EKGs, labs and examining the patient as well as establishing an assessment and plan that was discussed with the patient.  > 50% of time was spent in direct patient care.  Signed, Addison Naegeli. Audie Box, MD, [place]  [address], [place], [place] [address] [phone]  07/30/2022 10:17 AM

## 2022-07-30 ENCOUNTER — Ambulatory Visit: Payer: Medicare PPO | Attending: Cardiovascular Disease | Admitting: Cardiovascular Disease

## 2022-07-30 ENCOUNTER — Encounter: Payer: Self-pay | Admitting: Cardiovascular Disease

## 2022-07-30 VITALS — BP 116/60 | HR 52 | Ht 61.0 in | Wt 137.6 lb

## 2022-07-30 DIAGNOSIS — E782 Mixed hyperlipidemia: Secondary | ICD-10-CM

## 2022-07-30 DIAGNOSIS — I25118 Atherosclerotic heart disease of native coronary artery with other forms of angina pectoris: Secondary | ICD-10-CM | POA: Diagnosis not present

## 2022-07-30 NOTE — Patient Instructions (Addendum)
Medication Instructions:  TAKE Metoprolol 1 tablet daily for 3 days, and then stop  STOP Imdur   *If you need a refill on your cardiac medications before your next appointment, please call your pharmacy*   Lab Work: LIPID (1 week before follow up appointment in 3 months)  If you have labs (blood work) drawn today and your tests are completely normal, you will receive your results only by: [place] (if you have MyChart) OR A paper copy in the mail If you have any lab test that is abnormal or we need to change your treatment, we will call you to review the results.   Follow-Up: At [facility], you and your health needs are our priority.  As part of our continuing mission to provide you with exceptional heart care, we have created designated Provider Care Teams.  These Care Teams include your primary Cardiologist (physician) and Advanced Practice Providers (APPs -  Physician Assistants and Nurse Practitioners) who all work together to provide you with the care you need, when you need it.  We recommend signing up for the patient portal called "MyChart".  Sign up information is provided on this After Visit Summary.  MyChart is used to connect with patients for Virtual Visits (Telemedicine).  Patients are able to view lab/test results, encounter notes, upcoming appointments, etc.  Non-urgent messages can be sent to your provider as well.   To learn more about what you can do with MyChart, go to [email or web address].    Your next appointment:   3 month(s) (labs 1 week before)  Provider:   Evalina Field, MD

## 2022-08-04 ENCOUNTER — Telehealth: Payer: Self-pay | Admitting: Cardiovascular Disease

## 2022-08-04 NOTE — Telephone Encounter (Signed)
Spoke with pt regarding some issues recently. Pt had stents placed on 2/21, one in the LAD and another in the Circumflex. Pt states that she is having some chest discomfort that she feels when she is walking to doing things around the house. She says it starts in her back and then works its way to the middle of her chest. She says that is goes away when she stops the activity she is doing. Pt states that discomfort feels like minor heartburn. Pt is currently on protonix and is taking as prescribed. Pt does say that the discomfort is comparative to her anginal pain. Pt also states that over the last couple of days she has had increased dizziness. She feels like she has been eating better since stent placement. She says she believes that she gets enough water but she could try to do better with this. Pt states that her last dose of metoprolol was on Sunday after weaning off and last dose of Imdur was on Friday. Pt's most recent blood pressure was 115/61. Earlier when she was dizzy she took her blood pressure 152/84, HR-92. Pt does state that she believes her headache is being caused by seasonal allergies. She has not started any anti-histamine medication at this time. Pt is out running errands now but will call her back when she is at home to try a nitro for chest pressure and re-take her blood pressure.  Called pt back she states that her blood pressure is currently 106/57, HR64. Pt also tried a SL nitro for chest discomfort she said that it did help her now it feels like pressure instead of burning now. Advised pt that I would send over to Dr. Audie Box to advise. Pt verbalizes understanding.

## 2022-08-04 NOTE — Telephone Encounter (Signed)
STAT if patient feels like he/she is going to faint   Are you dizzy now? Yes.   Do you feel faint or have you passed out? No  Do you have any other symptoms? Headache   Have you checked your HR and BP (record if available)? 152/84 HR 92     Patient recently had stents put in and states she was told to expect certain things after, but believes that she may need to speak to someone to verify dizziness is okay.

## 2022-08-05 NOTE — Telephone Encounter (Signed)
Called patient, scheduled for DOD appointment on Friday 03/15. Patient verbalized understanding.

## 2022-08-12 ENCOUNTER — Telehealth (HOSPITAL_COMMUNITY): Payer: Self-pay

## 2022-08-12 ENCOUNTER — Encounter (HOSPITAL_COMMUNITY): Payer: Self-pay

## 2022-08-12 NOTE — Progress Notes (Addendum)
Cardiology Office Note:   Date:  08/13/2022  NAME:  Tiffany Mayo    MRN: [id number] DOB:  [birth year]   PCP:  Haywood Pao, MD  Cardiologist:  Evalina Field, MD  Electrophysiologist:  None   Referring MD: Haywood Pao, MD   Chief Complaint  Patient presents with   Follow-up        History of Present Illness:   Tiffany Mayo is a 82 y.o. female with a hx of CAD and HLD who presents for follow-up.  She reports she is still having intermittent burning in her chest.  This occurs with activity.  Also into her back.  Symptoms are worsened with stressful days.  Her husband has memory problems.  She reports she is going to [place] to visit with her youngest son.  She reports there are issues with the youngest son's wife.  They get along but have their difficulties.  Her EKG is normal.  She denies any chest pain or shortness of breath in office.  She has not been that active.  Her oldest son is worried about her doing activity.  Her EKG remains normal.  Her exam is normal.  We discussed activity is okay.  We discussed she could have acid reflux worsening symptoms versus small vessel disease.  We discussed adding Imdur.  She is amendable to this.  Problem List CAD -60% pLAD -mid LAD 80% -> PCI 07/21/2022 -LCX/OM1 90% -> PCI 07/21/2022 2. HLD  -T chol 203, HDL 44, LDL 138, TG 104 3. TIA  Past Medical History: Past Medical History:  Diagnosis Date   Arthritis    spine, neck   Coronary artery disease    GERD (gastroesophageal reflux disease)    Headache    SINUS    Hemorrhoid    Hyperlipemia    Hypertension    Hypothyroidism     Past Surgical History: Past Surgical History:  Procedure Laterality Date   ABDOMINAL HYSTERECTOMY  1990   ANTERIOR CERVICAL DECOMP/DISCECTOMY FUSION  04/03/2012   Procedure: ANTERIOR CERVICAL DECOMPRESSION/DISCECTOMY FUSION 1 LEVEL;  Surgeon: Hosie Spangle, MD;  Location: [facility];  Service: Neurosurgery;  Laterality: N/A;  Cervical  Five-Six Anterior Cervical Decompression with Fusion Plating and Bonegraft   BUNIONECTOMY     RIGHT FOOT   CARDIAC CATHETERIZATION     CORONARY STENT INTERVENTION N/A 07/21/2022   Procedure: CORONARY STENT INTERVENTION;  Surgeon: Sherren Mocha, MD;  Location: [facility];  Service: Cardiovascular;  Laterality: N/A;   EYE SURGERY     Lasik, Catarct   GANGLION CYST EXCISION     BIL WRIST    JOINT REPLACEMENT     Right middle finger   LEFT HEART CATH AND CORONARY ANGIOGRAPHY N/A 07/08/2022   Procedure: LEFT HEART CATH AND CORONARY ANGIOGRAPHY;  Surgeon: Troy Sine, MD;  Location: [place] [facility];  Service: Cardiovascular;  Laterality: N/A;   TRIGGER FINGER RELEASE     thumbs    Current Medications: Current Meds  Medication Sig   acetaminophen (TYLENOL) 500 MG tablet Take 500-1,000 mg by mouth every 6 (six) hours as needed (pain.).   aspirin EC 81 MG tablet Take 1 tablet (81 mg total) by mouth daily. Swallow whole.   atorvastatin (LIPITOR) 80 MG tablet Take 80 mg by mouth in the morning.   clopidogrel (PLAVIX) 75 MG tablet Take 1 tablet (75 mg total) by mouth daily.   escitalopram (LEXAPRO) 5 MG tablet Take 5 mg by  mouth in the morning.   isosorbide mononitrate (IMDUR) 30 MG 24 hr tablet Take 0.5 tablets (15 mg total) by mouth daily.   levothyroxine (SYNTHROID) 50 MCG tablet Take 50 mcg by mouth every other day.   levothyroxine (SYNTHROID) 75 MCG tablet Take 75 mcg by mouth every other day.   Multiple Vitamins-Minerals (AIRBORNE PO) Take 3 tablets by mouth daily.   nitroGLYCERIN (NITROSTAT) 0.4 MG SL tablet Place 1 tablet (0.4 mg total) under the tongue every 5 (five) minutes as needed for chest pain.   olmesartan (BENICAR) 20 MG tablet Take 20 mg by mouth in the morning.   Polyethyl Glycol-Propyl Glycol (SYSTANE ULTRA) 0.4-0.3 % SOLN Place 1-2 drops into both eyes 3 (three) times daily as needed (dry/irritated eyes.).   [DISCONTINUED] pantoprazole (PROTONIX) 20 MG  tablet Take 20 mg by mouth in the morning.     Allergies:    Latex, Codeine, and Sulfa antibiotics   Social History: Social History   Socioeconomic History   Marital status: Married    Spouse name: Not on file   Number of children: 2   Years of education: Not on file   Highest education level: Not on file  Occupational History   Occupation: Pharmacist, 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  Tobacco Use   Smoking status: Never   Smokeless tobacco: Never  Substance and Sexual Activity   Alcohol use: Yes    Comment: OCC WINE    Drug use: No   Sexual activity: Not on file  Other Topics Concern   Not on file  Social History Narrative   Not on file   Social Determinants of Health   Financial Resource Strain: Not on file  Food Insecurity: No Food Insecurity (07/21/2022)   Hunger Vital Sign    Worried About Running Out of Food in the Last Year: Never true    Ran Out of Food in the Last Year: Never true  Transportation Needs: No Transportation Needs (07/21/2022)   PRAPARE - Hydrologist (Medical): No    Lack of Transportation (Non-Medical): No  Physical Activity: Not on file  Stress: Not on file  Social Connections: Not on file     Family History: The patient's family history includes Heart disease in her father.  ROS:   All other ROS reviewed and negative. Pertinent positives noted in the HPI.     EKGs/Labs/Other Studies Reviewed:   The following studies were personally reviewed by me today:  EKG:  EKG is ordered today.  The ekg ordered today demonstrates normal sinus rhythm rate 78, no acute ischemic changes or evidence of infarction, and was personally reviewed by me.   LHC 07/21/2022 Severe mid LAD stenosis, treated successfully with PTCA and stenting using a 2.75 x 16 mm Synergy DES   Severe mid circumflex/first OM sequential stenoses, treated successfully with a single stent covering both lesions (2.5 x 28 mm Synergy DES)   Recommendations: Overnight  observation, DAPT with aspirin and clopidogrel 6 months without interruption.  Recent Labs: 04/07/2022: ALT 14 04/08/2022: TSH 4.968 07/22/2022: BUN 19; Creatinine, Ser 1.02; Hemoglobin 11.5; Platelets 159; Potassium 3.8; Sodium 140   Recent Lipid Panel    Component Value Date/Time   CHOL 203 (H) 04/08/2022 0000   TRIG 104 04/08/2022 0000   HDL 44 04/08/2022 0000   CHOLHDL 4.6 04/08/2022 0000   VLDL 21 04/08/2022 0000   LDLCALC 138 (H) 04/08/2022 0000    Physical Exam:   VS:  BP Marland Kitchen)  124/54 (BP Location: Left Arm, Patient Position: Sitting, Cuff Size: Normal)   Pulse 78   Ht 5\' 1"  (1.549 m)   Wt 136 lb 9.6 oz (62 kg)   SpO2 98%   BMI 25.81 kg/m    Wt Readings from Last 3 Encounters:  08/13/22 136 lb 9.6 oz (62 kg)  07/30/22 137 lb 9.6 oz (62.4 kg)  07/28/22 139 lb (63 kg)    General: Well nourished, well developed, in no acute distress Head: Atraumatic, normal size  Eyes: PEERLA, EOMI  Neck: Supple, no JVD Endocrine: No thryomegaly Cardiac: Normal S1, S2; RRR; no murmurs, rubs, or gallops Lungs: Clear to auscultation bilaterally, no wheezing, rhonchi or rales  Abd: Soft, nontender, no hepatomegaly  Ext: No edema, pulses 2+ Musculoskeletal: No deformities, BUE and BLE strength normal and equal Skin: Warm and dry, no rashes   Neuro: Alert and oriented to person, place, time, and situation, CNII-XII grossly intact, no focal deficits  Psych: Normal mood and affect   ASSESSMENT:   Tiffany Mayo is a 82 y.o. female who presents for the following: 1. Coronary artery disease of native artery of native heart with stable angina pectoris (HCC)   2. Precordial pain   3. Mixed hyperlipidemia     PLAN:   1. Coronary artery disease of native artery of native heart with stable angina pectoris (HCC) 2. Precordial pain 3. Mixed hyperlipidemia -Continues to report symptoms of burning in her chest with activity.  Symptoms are still present after PCI to the LAD and OM branch.  She  does have small diagonal disease.  Could represent ongoing angina.  Continue aspirin and Plavix.  Add Imdur 15 mg daily.  She has known acid reflux.  She is on Protonix.  Increase this to 40 mg daily.  I have encouraged her to remain active.  Her blood pressure is well-controlled.  It is unclear if her symptoms were all related to angina to begin with.  She reports a lot of stress with her husband as well as family issues.  She will work on this.  She will see [place] back in June to discuss further.  Hopefully she will get some relief with these medication changes today.  DECIDE Plaque data Total plaque volume 565 mm3 (calcified 71 mm3; non-calcified 494 mm3) Plaque percentile 59th percentile  Severe plaque   Did you add a medication? No  If yes, how many? NA  If no, reason? Reason for not adding med: Other No needed  Did you remove a medication? No  Did you increase the dosage of any medication? No  Did you decrease the dosage of any medication? No  Did you refer to a specialist (i.e. lipid clinic, preventive cardiology, endocrinology)? No  Has patient seen plaque report? No   Disposition: Return in about 3 months (around 11/13/2022).  Medication Adjustments/Labs and Tests Ordered: Current medicines are reviewed at length with the patient today.  Concerns regarding medicines are outlined above.  Orders Placed This Encounter  Procedures   EKG 12-Lead   Meds ordered this encounter  Medications   isosorbide mononitrate (IMDUR) 30 MG 24 hr tablet    Sig: Take 0.5 tablets (15 mg total) by mouth daily.    Dispense:  45 tablet    Refill:  3   pantoprazole (PROTONIX) 40 MG tablet    Sig: Take 1 tablet (40 mg total) by mouth in the morning.    Dispense:  90 tablet    Refill:  3  Patient Instructions  Medication Instructions:  START Imdur 15 mg daily  INCREASE Protonix 40 mg daily   *If you need a refill on your cardiac medications before your next appointment, please call your  pharmacy*   Follow-Up: At [facility], you and your health needs are our priority.  As part of our continuing mission to provide you with exceptional heart care, we have created designated Provider Care Teams.  These Care Teams include your primary Cardiologist (physician) and Advanced Practice Providers (APPs -  Physician Assistants and Nurse Practitioners) who all work together to provide you with the care you need, when you need it.  We recommend signing up for the patient portal called "MyChart".  Sign up information is provided on this After Visit Summary.  MyChart is used to connect with patients for Virtual Visits (Telemedicine).  Patients are able to view lab/test results, encounter notes, upcoming appointments, etc.  Non-urgent messages can be sent to your provider as well.   To learn more about what you can do with MyChart, go to [email or web address].    Your next appointment:   Keep scheduled appointment in June  Provider:   Reatha Harps, MD        Time Spent with Patient: I have spent a total of 35 minutes with patient reviewing hospital notes, telemetry, EKGs, labs and examining the patient as well as establishing an assessment and plan that was discussed with the patient.  > 50% of time was spent in direct patient care.  Signed, Lenna Gilford. Flora Lipps, MD, [facility]  [facility]  [address], [address] [place], [place] [address] [phone]  08/13/2022 1:04 PM

## 2022-08-12 NOTE — Telephone Encounter (Signed)
Attempted to call patient in regards to Cardiac Rehab - LM on VM Mailed letter 

## 2022-08-13 ENCOUNTER — Encounter: Payer: Self-pay | Admitting: Cardiovascular Disease

## 2022-08-13 ENCOUNTER — Ambulatory Visit: Payer: Medicare PPO | Attending: Cardiovascular Disease | Admitting: Cardiovascular Disease

## 2022-08-13 VITALS — BP 124/54 | HR 78 | Ht 61.0 in | Wt 136.6 lb

## 2022-08-13 DIAGNOSIS — E782 Mixed hyperlipidemia: Secondary | ICD-10-CM

## 2022-08-13 DIAGNOSIS — R072 Precordial pain: Secondary | ICD-10-CM | POA: Diagnosis not present

## 2022-08-13 DIAGNOSIS — I25118 Atherosclerotic heart disease of native coronary artery with other forms of angina pectoris: Secondary | ICD-10-CM | POA: Diagnosis not present

## 2022-08-13 MED ORDER — ISOSORBIDE MONONITRATE ER 30 MG PO TB24: 15.0000 mg | ORAL_TABLET | Freq: Every day | ORAL | 3 refills | Status: DC

## 2022-08-13 MED ORDER — PANTOPRAZOLE SODIUM 40 MG PO TBEC: 40.0000 mg | DELAYED_RELEASE_TABLET | Freq: Every morning | ORAL | 3 refills | Status: DC

## 2022-08-13 NOTE — Patient Instructions (Signed)
Medication Instructions:  START Imdur 15 mg daily  INCREASE Protonix 40 mg daily   *If you need a refill on your cardiac medications before your next appointment, please call your pharmacy*   Follow-Up: At [facility], you and your health needs are our priority.  As part of our continuing mission to provide you with exceptional heart care, we have created designated Provider Care Teams.  These Care Teams include your primary Cardiologist (physician) and Advanced Practice Providers (APPs -  Physician Assistants and Nurse Practitioners) who all work together to provide you with the care you need, when you need it.  We recommend signing up for the patient portal called "MyChart".  Sign up information is provided on this After Visit Summary.  MyChart is used to connect with patients for Virtual Visits (Telemedicine).  Patients are able to view lab/test results, encounter notes, upcoming appointments, etc.  Non-urgent messages can be sent to your provider as well.   To learn more about what you can do with MyChart, go to [email or web address].    Your next appointment:   Keep scheduled appointment in June  Provider:   Evalina Field, MD

## 2022-08-20 DIAGNOSIS — I2510 Atherosclerotic heart disease of native coronary artery without angina pectoris: Secondary | ICD-10-CM | POA: Diagnosis not present

## 2022-08-20 DIAGNOSIS — G40909 Epilepsy, unspecified, not intractable, without status epilepticus: Secondary | ICD-10-CM | POA: Diagnosis not present

## 2022-08-20 DIAGNOSIS — G459 Transient cerebral ischemic attack, unspecified: Secondary | ICD-10-CM | POA: Diagnosis not present

## 2022-08-20 DIAGNOSIS — R471 Dysarthria and anarthria: Secondary | ICD-10-CM | POA: Diagnosis not present

## 2022-08-20 DIAGNOSIS — R410 Disorientation, unspecified: Secondary | ICD-10-CM | POA: Diagnosis not present

## 2022-08-20 DIAGNOSIS — I160 Hypertensive urgency: Secondary | ICD-10-CM | POA: Diagnosis not present

## 2022-08-20 DIAGNOSIS — R9082 White matter disease, unspecified: Secondary | ICD-10-CM | POA: Diagnosis not present

## 2022-08-20 DIAGNOSIS — I6789 Other cerebrovascular disease: Secondary | ICD-10-CM | POA: Diagnosis not present

## 2022-08-20 DIAGNOSIS — I10 Essential (primary) hypertension: Secondary | ICD-10-CM | POA: Diagnosis not present

## 2022-08-20 DIAGNOSIS — R4701 Aphasia: Secondary | ICD-10-CM | POA: Diagnosis not present

## 2022-08-20 DIAGNOSIS — Z79899 Other long term (current) drug therapy: Secondary | ICD-10-CM | POA: Diagnosis not present

## 2022-08-20 DIAGNOSIS — R4781 Slurred speech: Secondary | ICD-10-CM | POA: Diagnosis not present

## 2022-08-20 DIAGNOSIS — I739 Peripheral vascular disease, unspecified: Secondary | ICD-10-CM | POA: Diagnosis not present

## 2022-08-20 DIAGNOSIS — R4182 Altered mental status, unspecified: Secondary | ICD-10-CM | POA: Diagnosis not present

## 2022-08-21 DIAGNOSIS — I6782 Cerebral ischemia: Secondary | ICD-10-CM | POA: Diagnosis not present

## 2022-08-21 DIAGNOSIS — K219 Gastro-esophageal reflux disease without esophagitis: Secondary | ICD-10-CM | POA: Diagnosis not present

## 2022-08-21 DIAGNOSIS — E039 Hypothyroidism, unspecified: Secondary | ICD-10-CM | POA: Diagnosis not present

## 2022-08-21 DIAGNOSIS — Z79899 Other long term (current) drug therapy: Secondary | ICD-10-CM | POA: Diagnosis not present

## 2022-08-21 DIAGNOSIS — R29818 Other symptoms and signs involving the nervous system: Secondary | ICD-10-CM | POA: Diagnosis not present

## 2022-08-21 DIAGNOSIS — G40909 Epilepsy, unspecified, not intractable, without status epilepticus: Secondary | ICD-10-CM | POA: Diagnosis not present

## 2022-08-21 DIAGNOSIS — G319 Degenerative disease of nervous system, unspecified: Secondary | ICD-10-CM | POA: Diagnosis not present

## 2022-08-21 DIAGNOSIS — E785 Hyperlipidemia, unspecified: Secondary | ICD-10-CM | POA: Diagnosis not present

## 2022-08-21 DIAGNOSIS — R569 Unspecified convulsions: Secondary | ICD-10-CM | POA: Diagnosis not present

## 2022-08-21 DIAGNOSIS — I2510 Atherosclerotic heart disease of native coronary artery without angina pectoris: Secondary | ICD-10-CM | POA: Diagnosis not present

## 2022-08-21 DIAGNOSIS — G459 Transient cerebral ischemic attack, unspecified: Secondary | ICD-10-CM | POA: Diagnosis not present

## 2022-08-21 DIAGNOSIS — Z79890 Hormone replacement therapy (postmenopausal): Secondary | ICD-10-CM | POA: Diagnosis not present

## 2022-08-21 DIAGNOSIS — I160 Hypertensive urgency: Secondary | ICD-10-CM | POA: Diagnosis not present

## 2022-08-23 ENCOUNTER — Telehealth: Payer: Self-pay | Admitting: Neurology

## 2022-08-23 NOTE — Telephone Encounter (Signed)
Pt has called to report that a Neurologist at [facility]) has put pt on Levetiracetam 700 mg twice a day.  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  Pt is now having vision impairment and dizzy but is scared to stop taking the medication.  Pt has accepted the very next available appointment between Judson Roch, NP and Dr Leonie Man (along with wait list) she is asking to be called re: what to do.

## 2022-08-24 DIAGNOSIS — Z01810 Encounter for preprocedural cardiovascular examination: Secondary | ICD-10-CM | POA: Diagnosis not present

## 2022-08-24 NOTE — Progress Notes (Unsigned)
Cardiology Clinic Note   Date: 08/26/2022 ID: [name], DOB 08/08/1940, MRN [id number]  Primary Cardiologist:  Tiffany Field, MD  Patient Profile    Tiffany Mayo is a 82 y.o. female who presents to the clinic today for hospital follow-up.  Past medical history significant for: CAD. Coronary CTA with FFR 07/01/2022: Coronary calcium score 544 (80th percentile).  Severe >70% plaque in LCx and LAD, moderate 50-69% stenosis RCA.  FFR findings consistent with obstructive CAD in LAD and LCx.  Recommend cardiac catheterization. LHC 07/08/2022: Proximal RCA 30%, RV branch 80%, D1 #1 80%, D1 #2 95%.  Proximal to mid LAD 80%, proximal LAD 60%.  Proximal to mid Cx 80%, OM2 90%.  Normal LV function.  Normal LVEDP.  Consider CTS consult. Staged PCI 07/21/2022: PTCA and DES 2.75 x 16 mm to mid LAD.  DES 2.5 x 28 mm to mid Cx/OM1. Hypertension. Hyperlipidemia. Lipid panel 08/21/2022: LDL 65, HDL 43, TG 102, total 120. TIA. GERD. Hypothyroidism.   History of Present Illness    Tiffany Mayo was first evaluated by Dr. Audie Mayo on 06/28/2022 for evaluation of chest pain at the request of Dr. Osborne Casco.  Patient describes 2 to 78-month history of burning on the left side of chest that occurs with heavy activity and with eating.  Coronary CTA with FFR showed no obstructive CAD in LAD and LCx.  HCV revealed multivessel CAD (detailed above) and recommendation for CTS consult.  Isosorbide was added in the meantime.  Patient and her son met with Dr. Burt Knack on 07/20/2022 to discuss intervention options secondary to concerns for CABG given patient's current age.  At that time patient reported some improvement in angina but symptoms continued including when she was walking into the office.  Decision was made to bring patient back to Cath Lab for staged PCI.  Patient underwent successful PCI to with DES mid LAD and DES to mid circumflex/first OM.  In follow-up with Dr. Audie Mayo on 07/30/2022 patient complained of some  dizziness and lightheadedness.  She was weaned off metoprolol and isosorbide was stopped.  She was last seen in the office by Dr. Audie Mayo on 08/13/2022.  At that time she complained of continued intermittent burning in her chest worsened with stressful days.  Difficult to ascertain if this represented angina versus acid reflux.  She was started on isosorbide and Protonix dose was increased.  Most recently, on 08/20/2022 patient was evaluated in the ER for TIA while visiting family in [place]. Per notes from [facility]:  "DIL noticed mental status changes around 7:30PM . Patient was staring at her and talking incoherently for a brief moment and then gradually started founding some words with out making a clear sense. The daughter called patient's son and they took her to the Emergency room in the private car . Upon arrival to the Emergency room patient was still incoherent and not clear, however symptoms resolved quickly; deemed not a TPA candidate and evaluated by TeleNeuro . BP was elevated. CTH showed--NO ACUTE ABNORMALITIES IN THE BRAIN. NO INTRACRANIAL BLEEDING. SMALL VESSEL OCCLUSIVE DISEASE IN THE PERIVENTRICULAR WHITE MATTER OF THE CEREBRAL HEMISPHERES. NO INTRACRANIAL BLEEDING. NO MASSES OR MASS EFFECTS." MRI showed no acute infarct or intracranial hemorrhage.  Global volume loss and chronic microvascular ischemic changes. "Tele neurology service consulted. Outside records note an EEG with left frontal sharp waves, as a pre-disposition to epilepsy. Patient received IV Keppra. Continue on oral Keppra Seizure precautions."  Patient was discharged with diagnosis of seizures  and prescribed on Keppra.  Instructed to follow-up with outpatient neurology and PCP.  Today, patient is accompanied by her son. She arrived home from [place] last night. She denies any further episodes of altered mental status or speech difficulty. Patient had no awareness that she was not speaking clearly. Patient's son is very  concerned about her episode in [place] (he was not there) and confused if patient had a seizure or a TIA. Went over notes from ER with patient and son. Patient's son is concenered about patient having another event. He feels patient's husband may not recognize if she is having symptoms. He was also concerned about patient overnight. Patient reports she will occasionally get up once in the middle of the night. She is always very cautious getting up from bed and transfers from sit to stand slowly. Suggested calling patient in the morning to check on her and make sure she sounds normal. Provided ED precautions.  Patient has a visit with neurology scheduled for next week. Will defer any further cardiac workup until after that visit.   From a cardiac standpoint, patient is doing well. She reports she continues to have burning in the center of her chest/epigastrium with heavy exertion that resolves with rest. This has improved since starting isosorbide. She is splitting up the dose to 15 mg in the morning and 15 mg at night, as the full 30 mg dose "made me feel drunk." She reports an increased amount of stress surrounding her travel to [place] even prior to ER visit. Her son wonders if this could be related to her symptoms. She denies shortness of breath, lower extremity edema, orthopnea, PND, or palpitations. She is looking forward to starting in cardiac rehab.     ROS: All other systems reviewed and are otherwise negative except as noted in History of Present Illness.  Studies Reviewed    ECG personally reviewed by me today: NSR, rate 62 bpm.  No significant changes from 08/13/2022.   Physical Exam    VS:  BP 128/72   Pulse 62   Ht 5' 1.5" (1.562 m)   Wt 139 lb (63 kg)   SpO2 98%   BMI 25.84 kg/m  , BMI Body mass index is 25.84 kg/m.  GEN: Well nourished, well developed, in no acute distress. Neck: No JVD or carotid bruits. Cardiac:  RRR. No murmurs. No rubs or gallops.   Respiratory:  Respirations  regular and unlabored. Clear to auscultation without rales, wheezing or rhonchi. GI: Soft, nontender, nondistended. Extremities: Radials/DP/PT 2+ and equal bilaterally. No clubbing or cyanosis. No edema'  Skin: Warm and dry, no rash. Neuro: Strength intact.  Assessment & Plan   CAD.  S/p PCI with DES to mid LAD and DES to mid Cx/OM1 February 2024.  Postprocedure follow-up patient had continued complaints of chest burning and was started on isosorbide and Protonix was increased.  Patient reports continued burning in center of chest/epigastrium with heavy exertion that resolves with rest. This has improved with isosorbide. She needed to split her dose to 15 mg in the morning and 15 in the evening secondary to "feeling drunk" with the full dose therefore, I do not think increasing Imdur is an option at this point. She does report a lot of stress related to her recent trip to [place] even before her hospital visit. Continue aspirin, Plavix, atorvastatin, isosorbide, as needed SL NTG.  Patient not on beta-blocker secondary to complaints of lightheadedness and dizziness. Advised patient to utilize prn NTG if burning  pain does not resolve after 10 minutes of rest. She verbalized understanding and will call office if she takes prn NTG.  Hypertension.  BP today 128/72. Patient denies headaches or dizziness. Continue olmesartan. Hyperlipidemia.  LDL March 2024 65, at goal.  Continue atorvastatin. TIA vs. Seizure. She was recently seen in the ER in [place] while visiting family and found to be hypertensive with symptoms concerning for TIA versus seizure. MRI without acute findings. Patient was unaware she was having speech difficulty. She denies palpitations, shortness of breath, or chest pain prior to event. She reports her daughter in law said she may have made a vague complaint about a headache. Patient was started on Keppra and has a follow-up with neurology next week. Will defer any further cardiac workup until  after neurology visit. ED precautions provided. Patient understands she is to refrain from driving.   Disposition: Keep previously scheduled follow-up with Dr. Audie Mayo. Return sooner as needed.     Cardiac Rehabilitation Eligibility Assessment  The patient is ready to start cardiac rehabilitation from a cardiac standpoint.        Signed, Tiffany Mayo. Tiffany Starr, DNP, NP-C

## 2022-08-24 NOTE — Telephone Encounter (Signed)
I reviewed hospital notes [place] via care everywhere, 08/20/22 was in [place] visiting her son when they noted a change in mental status, her talking was incoherent, her speech was slurred, she was staring.  CT head showed no acute abnormalities.  Stroke was ruled out.  MRI brain was negative.  EEG showed left frontal sharp waves.  Given IV Keppra and started on oral Keppra.  Continue aspirin, Plavix, statin.  Started on Keppra 750 mg twice a day. She is traveling home tomorrow. Will see if we can get her in with Dr. Leonie Man. I called the patient and let he know will work on appointment.   04/27/22 Dr. Leonie Man: ASSESSMENT: 82 year old Caucasian lady with episode of transient left upper extremity and face paresthesias in November 2023 possibly right brain subcortical TIA from small vessel disease.  Atypical migraine or focal seizures less likely.  Vascular risk factors of hypertension and hyperlipidemia.  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

## 2022-08-26 ENCOUNTER — Encounter: Payer: Self-pay | Admitting: Student

## 2022-08-26 ENCOUNTER — Ambulatory Visit: Payer: Medicare PPO | Attending: Cardiovascular Disease | Admitting: Student

## 2022-08-26 VITALS — BP 128/72 | HR 62 | Ht 61.5 in | Wt 139.0 lb

## 2022-08-26 DIAGNOSIS — I10 Essential (primary) hypertension: Secondary | ICD-10-CM | POA: Diagnosis not present

## 2022-08-26 DIAGNOSIS — I25118 Atherosclerotic heart disease of native coronary artery with other forms of angina pectoris: Secondary | ICD-10-CM | POA: Diagnosis not present

## 2022-08-26 DIAGNOSIS — R569 Unspecified convulsions: Secondary | ICD-10-CM | POA: Diagnosis not present

## 2022-08-26 DIAGNOSIS — G459 Transient cerebral ischemic attack, unspecified: Secondary | ICD-10-CM | POA: Diagnosis not present

## 2022-08-26 DIAGNOSIS — E785 Hyperlipidemia, unspecified: Secondary | ICD-10-CM | POA: Diagnosis not present

## 2022-08-26 NOTE — Patient Instructions (Signed)
Medication Instructions:  Your physician recommends that you continue on your current medications as directed. Please refer to the Current Medication list given to you today.  *If you need a refill on your cardiac medications before your next appointment, please call your pharmacy*   Lab Work: NONE If you have labs (blood work) drawn today and your tests are completely normal, you will receive your results only by: MyChart Message (if you have MyChart) OR A paper copy in the mail If you have any lab test that is abnormal or we need to change your treatment, we will call you to review the results.   Testing/Procedures: NONE   Follow-Up: At [facility], you and your health needs are our priority.  As part of our continuing mission to provide you with exceptional heart care, we have created designated Provider Care Teams.  These Care Teams include your primary Cardiologist (physician) and Advanced Practice Providers (APPs -  Physician Assistants and Nurse Practitioners) who all work together to provide you with the care you need, when you need it.  We recommend signing up for the patient portal called "MyChart".  Sign up information is provided on this After Visit Summary.  MyChart is used to connect with patients for Virtual Visits (Telemedicine).  Patients are able to view lab/test results, encounter notes, upcoming appointments, etc.  Non-urgent messages can be sent to your provider as well.   To learn more about what you can do with MyChart, go to [email or web address].    Your next appointment:   Please keep your upcoming appointment.  

## 2022-08-27 ENCOUNTER — Telehealth (HOSPITAL_COMMUNITY): Payer: Self-pay | Admitting: *Deleted

## 2022-08-27 NOTE — Telephone Encounter (Signed)
Pt received please contact letter and left message on departmental voicemail. Pt is intereted in participating in Cardiac rehab.  Called and spoke to pt who also updated me about her recent health.  Hospitalized in [place] on 3/22 for ?TIA verse seizure.  Pt seen on 3.28 by cardiology.  Has an upcoming appt on 4/4 with Dr. Leonie Man- neurology.  Would like for pt to complete this in person consult to determine plan of care from neuro standpoint and any possible impacts from participating in a group exercise program. Pt in agreement. Cherre Huger, BSN Cardiac and Training and development officer

## 2022-09-02 ENCOUNTER — Ambulatory Visit: Payer: Medicare PPO | Admitting: Neurology

## 2022-09-02 ENCOUNTER — Encounter: Payer: Self-pay | Admitting: Neurology

## 2022-09-02 VITALS — BP 153/74 | HR 59 | Ht 61.5 in | Wt 140.0 lb

## 2022-09-02 DIAGNOSIS — Z8673 Personal history of transient ischemic attack (TIA), and cerebral infarction without residual deficits: Secondary | ICD-10-CM

## 2022-09-02 DIAGNOSIS — R4701 Aphasia: Secondary | ICD-10-CM

## 2022-09-02 DIAGNOSIS — G40109 Localization-related (focal) (partial) symptomatic epilepsy and epileptic syndromes with simple partial seizures, not intractable, without status epilepticus: Secondary | ICD-10-CM | POA: Diagnosis not present

## 2022-09-02 MED ORDER — LEVETIRACETAM ER 500 MG PO TB24: 500.0000 mg | ORAL_TABLET | Freq: Every day | ORAL | 5 refills | Status: DC

## 2022-09-02 NOTE — Progress Notes (Signed)
Patient: Tiffany Mayo Date of Birth: June 10, 1940  Reason for Visit: Follow up History from: Patient Primary Neurologist: Tiffany Mayo     HISTORY OF PRESENT ILLNESS: Today 09/02/22  Patient seen today upon request as she had episode of possible seizure while visiting her son in [place] in March 2024.  I reviewed hospital medicine discharge summary from [facility]  [place] where she was admitted from 08/20/2022 to 08/21/2022.Marland Kitchen  She was visiting her son and daughter-in-law when all of a sudden she was noted as staring talking incoherently and double forming words and not making sense while talking.  He was brought into the emergency room in the private car.  CT head was negative for acute abnormality MRI scan of the brain was also done which showed no acute changes changes of chronic small vessel disease.  Teleneurology was consulted.  Patient had a second brief episode the next morning while in the hospital and talking to the nurse which was lasting only 5 to 10 minutes.  Given the fact that previously she had an abnormal EEG with sharp.  Episodes were presumed to be She was loaded with Keppra and started on Keppra 750 twice daily.  Patient states she did not tolerate this well it made her quite sleepy and she felt off balance.  She reduced the dose.  375 twice daily which also she is having some trouble tolerating with feeling off balance and eating a lot.  She has had no further episodes of staring speech difficulties since coming back to [place].  Lab work on 08/21/2018 first included A1c 5.3, LDL cholesterol 65 mg percent.  TSH, B12, folate and CMP and CBC were all normal.  EEG was planned but was not done.  Patient remains on aspirin and Plavix which is tolerating well with minor bruising and no bleeding.  Her blood pressure is usually under good control but today it is slightly elevated at 153/74.  Mains on Lipitor 80 mg which is tolerating well without any side  effects. Prior visit 07/28/2022 : Here today for follow up, 04/08/22 3 hour numbness to left fingers, arm, neck, face. Was told TIA. Admitted 07/21/22 for 2 cardiac stent placement. She has done well. Is on aspirin 81 mg and Plavix 75 mg post stent placement, duration per cardiology.  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  Her BP looks good 124/67 on olmesartan, metoprolol, Imdur.  She is on Lipitor 80 mg daily.  Her husband has some health issues.  She is independent and drives.  She looks quite good today.  Does note occasionally a side-to-side head tremor, has been going on for awhile before TIA.  HISTORY  04/27/22 Dr. Leonie Mayo HPI: Ms. Tiffany Mayo is a 82 year old pleasant Caucasian lady seen today for initial office consultation visit for TIA.  History is obtained from the patient and review of electronic medical records and I personally reviewed pertinent available imaging films in PACS.  She has past medical history of hypertension, hyperlipidemia, hypothyroidism and gastroesophageal reflux disease.  He presented on 04/08/2022 for evaluation for a 3-hour episode of left fingertip gradually spread to involve left arm, , left side of the neck as well as face.  She denies any accompanying slurred speech , mass of the difficulty.  She denied any accompanying headache, slurred speech or gait difficulty.  She fell asleep and next day next day when she woke  up in morning the numbness was gone.  She has had no recurrent episodes since then.  She denies any prior history of TIA, stroke or significant neurological problems.  He has no history of migraine headaches.  She admits she has been under significant stress recently since her husband diagnosed with memory problems and possible dementia.  Patient underwent MRI of The Brain which did not show any acute abnormality.  MRI scan cervical spine showed only minor degenerative changes but no  signal cord compression or root or foraminal CT angiogram of the brain and neck did not show large vessel stenosis or occlusion but showed 2 mm terminal left carotid and cavernous carotid aneurysm.  She has no family history of ruptured brain aneurysm he has risk factors of uncontrolled hypertension, smoking known prior aneurysms in the brain.  LDL cholesterol was 138 mg percent and hemoglobin A1c was 5.0.  Patient was commended to take Lipitor 40 mg daily but her primary care physician has changed her to taking it 3 days a week instead now that she is tolerating well without bruising or bleeding..  Blood pressure in the good control today it is 137/64.Marland Kitchen   REVIEW OF SYSTEMS: Out of a complete 14 system review of symptoms, the patient complains only of the following symptoms, and all other reviewed systems are negative.  See HPI  ALLERGIES: Allergies  Allergen Reactions   Latex Rash   Codeine Nausea And Vomiting   Sulfa Antibiotics Nausea And Vomiting    HOME MEDICATIONS: Outpatient Medications Prior to Visit  Medication Sig Dispense Refill   acetaminophen (TYLENOL) 500 MG tablet Take 500-1,000 mg by mouth every 6 (six) hours as needed (pain.).     aspirin EC 81 MG tablet Take 1 tablet (81 mg total) by mouth daily. Swallow whole. 30 tablet 11   atorvastatin (LIPITOR) 80 MG tablet Take 80 mg by mouth in the morning.     clopidogrel (PLAVIX) 75 MG tablet Take 1 tablet (75 mg total) by mouth daily. 30 tablet 11   escitalopram (LEXAPRO) 5 MG tablet Take 5 mg by mouth in the morning.     isosorbide mononitrate (IMDUR) 30 MG 24 hr tablet Take 0.5 tablets (15 mg total) by mouth daily. 45 tablet 3   levothyroxine (SYNTHROID) 50 MCG tablet Take 50 mcg by mouth every other day.     levothyroxine (SYNTHROID) 75 MCG tablet Take 75 mcg by mouth every other day.     Multiple Vitamins-Minerals (AIRBORNE PO) Take 3 tablets by mouth daily.     nitroGLYCERIN (NITROSTAT) 0.4 MG SL tablet Place 1 tablet (0.4 mg  total) under the tongue every 5 (five) minutes as needed for chest pain. 90 tablet 3   olmesartan (BENICAR) 20 MG tablet Take 20 mg by mouth in the morning.     pantoprazole (PROTONIX) 40 MG tablet Take 1 tablet (40 mg total) by mouth in the morning. 90 tablet 3   Polyethyl Glycol-Propyl Glycol (SYSTANE ULTRA) 0.4-0.3 % SOLN Place 1-2 drops into both eyes 3 (three) times daily as needed (dry/irritated eyes.).     levETIRAcetam (KEPPRA) 750 MG tablet Take 375 mg by mouth 2 (two) times daily.     No facility-administered medications prior to visit.    PAST MEDICAL HISTORY: Past Medical History:  Diagnosis Date   Arthritis    spine, neck   Coronary artery disease    GERD (gastroesophageal reflux disease)    Headache    SINUS    Hemorrhoid  Hyperlipemia    Hypertension    Hypothyroidism    Seizures     PAST SURGICAL HISTORY: Past Surgical History:  Procedure Laterality Date   ABDOMINAL HYSTERECTOMY  1990   ANTERIOR CERVICAL DECOMP/DISCECTOMY FUSION  04/03/2012   Procedure: ANTERIOR CERVICAL DECOMPRESSION/DISCECTOMY FUSION 1 LEVEL;  Surgeon: Hosie Spangle, MD;  Location: [facility];  Service: Neurosurgery;  Laterality: N/A;  Cervical Five-Six Anterior Cervical Decompression with Fusion Plating and Bonegraft   BUNIONECTOMY     RIGHT FOOT   CARDIAC CATHETERIZATION     CORONARY STENT INTERVENTION N/A 07/21/2022   Procedure: CORONARY STENT INTERVENTION;  Surgeon: Sherren Mocha, MD;  Location: [facility];  Service: Cardiovascular;  Laterality: N/A;   EYE SURGERY     Lasik, Catarct   GANGLION CYST EXCISION     BIL WRIST    JOINT REPLACEMENT     Right middle finger   LEFT HEART CATH AND CORONARY ANGIOGRAPHY N/A 07/08/2022   Procedure: LEFT HEART CATH AND CORONARY ANGIOGRAPHY;  Surgeon: Troy Sine, MD;  Location: [facility];  Service: Cardiovascular;  Laterality: N/A;   TRIGGER FINGER RELEASE     thumbs    FAMILY HISTORY: Family History  Problem  Relation Age of Onset   Heart disease Father     SOCIAL HISTORY: Social History   Socioeconomic History   Marital status: Married    Spouse name: Not on file   Number of children: 2   Years of education: Not on file   Highest education level: Not on file  Occupational History   Occupation: Pharmacist, 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  Tobacco Use   Smoking status: Never   Smokeless tobacco: Never  Substance and Sexual Activity   Alcohol use: Yes    Comment: OCC WINE    Drug use: No   Sexual activity: Not on file  Other Topics Concern   Not on file  Social History Narrative   Not on file   Social Determinants of Health   Financial Resource Strain: Not on file  Food Insecurity: No Food Insecurity (07/21/2022)   Hunger Vital Sign    Worried About Running Out of Food in the Last Year: Never true    Ran Out of Food in the Last Year: Never true  Transportation Needs: No Transportation Needs (07/21/2022)   PRAPARE - Hydrologist (Medical): No    Lack of Transportation (Non-Medical): No  Physical Activity: Not on file  Stress: Not on file  Social Connections: Not on file  Intimate Partner Violence: Not At Risk (07/21/2022)   Humiliation, Afraid, Rape, and Kick questionnaire    Fear of Current or Ex-Partner: No    Emotionally Abused: No    Physically Abused: No    Sexually Abused: No   PHYSICAL EXAM  Vitals:   09/02/22 1529  BP: (!) 153/74  Pulse: (!) 59  Weight: 140 lb (63.5 kg)  Height: 5' 1.5" (1.562 m)   Body mass index is 26.02 kg/m.  Generalized: Pleasant elderly Caucasian lady in no acute distress  Neurological examination  Mentation: Alert oriented to time, place, history taking. Follows all commands speech and language fluent Cranial nerve II-XII: Pupils were equal round reactive to light. Extraocular movements were full, visual field were full on confrontational test. Facial sensation and strength were normal. Head turning and shoulder shrug   were normal and symmetric. Intermittent side to side head tremor notes, is fine/mild Motor: The motor testing  reveals 5 over 5 strength of all 4 extremities. Good symmetric motor tone is noted throughout.  Sensory: Sensory testing is intact to soft touch on all 4 extremities. No evidence of extinction is noted.  Coordination: Cerebellar testing reveals good finger-nose-finger and heel-to-shin bilaterally.  Gait and station: Gait i deferred as she is in a wheelchair.   Reflexes: Deep tendon reflexes are symmetric and normal bilaterally.   DIAGNOSTIC DATA (LABS, IMAGING, TESTING) - I reviewed patient records, labs, notes, testing and imaging myself where available.  Lab Results  Component Value Date   WBC 5.9 07/22/2022   HGB 11.5 (L) 07/22/2022   HCT 33.6 (L) 07/22/2022   MCV 98.2 07/22/2022   PLT 159 07/22/2022      Component Value Date/Time   NA 140 07/22/2022 0242   NA 140 07/20/2022 1415   K 3.8 07/22/2022 0242   CL 106 07/22/2022 0242   CO2 24 07/22/2022 0242   GLUCOSE 95 07/22/2022 0242   BUN 19 07/22/2022 0242   BUN 20 07/20/2022 1415   CREATININE 1.02 (H) 07/22/2022 0242   CALCIUM 8.7 (L) 07/22/2022 0242   PROT 7.3 04/07/2022 1921   ALBUMIN 4.4 04/07/2022 1921   AST 20 04/07/2022 1921   ALT 14 04/07/2022 1921   ALKPHOS 47 04/07/2022 1921   BILITOT 0.4 04/07/2022 1921   GFRNONAA 55 (L) 07/22/2022 0242   GFRAA >60 08/04/2016 1833   Lab Results  Component Value Date   CHOL 203 (H) 04/08/2022   HDL 44 04/08/2022   LDLCALC 138 (H) 04/08/2022   TRIG 104 04/08/2022   CHOLHDL 4.6 04/08/2022   Lab Results  Component Value Date   HGBA1C 5.0 04/08/2022   No results found for: "VITAMINB12" Lab Results  Component Value Date   TSH 4.968 (H) 04/08/2022   ASSESSMENT: 82 year old Caucasian lady with episode of transient left upper extremity and face paresthesias in November 2023 possibly right brain subcortical TIA from small vessel disease.  .  Vascular risk factors of  hypertension and hyperlipidemia.  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  She is having some side effects on the current dose of Keppra changing to Keppra XR 500 mg once daily.  Check EEG and if there is significant significant electrographic irritability may consider adding Vimpat later.  I advised him not to drive for 6 months per [facility] and to avoid seizure provoking stimuli like sleep deprivation, medication noncompliance and extremes of dieting and exertion and stress.  Continue aspirin and Plavix for stroke prevention and maintain aggressive risk factor modification with strict control of hypertension with blood pressure goal below 130/90, lipids with LDL cholesterol goal below 70 mg percent and diabetes with hemoglobin A1c goal below 6.5%.  She will return for follow-up in the future in 4 months with  Butler Denmark, nurse practitioner or call earlier if necessary.  Greater than 50% time during this 40-minute prolonged visit was spent in counseling and coordination of care discussion about complex partial seizures and TIAs and answering questions.  Antony Contras, MD 09/02/2022, 4:06 PM [facility] [address] [place],  [address] [phone]

## 2022-09-02 NOTE — Patient Instructions (Signed)
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  She is having some side effects on the current dose of Keppra changing to Keppra XR 500 mg once daily.  Check EEG and if there is significant significant electrographic irritability may consider adding Vimpat later.  I advised him not to drive for 6 months per [facility] and to avoid seizure provoking stimuli like sleep deprivation, medication noncompliance and extremes of dieting and exertion and stress.  Continue aspirin and Plavix for stroke prevention and maintain aggressive risk factor modification with strict control of hypertension with blood pressure goal below 130/90, lipids with LDL cholesterol goal below 70 mg percent and diabetes with hemoglobin A1c goal below 6.5%.  She will return for follow-up in the future in 4 months with  Butler Denmark, nurse practitioner or call earlier if necessary  Seizure, Adult A seizure is a sudden burst of abnormal electrical and chemical activity in the brain. Seizures usually last from 30 seconds to 2 minutes.  What are the causes? Common causes of this condition include: Fever or infection. Problems that affect the brain. These may include: A brain or head injury. Bleeding in the brain. A brain tumor. Low levels of blood sugar or salt. Kidney problems or liver problems. Conditions that are passed from parent to child (are inherited). Problems with a substance, such as: Having a reaction to a drug or a medicine. Stopping the use of a substance all of a sudden (withdrawal). A stroke. Disorders that affect how you develop. Sometimes, the cause may not be known.  What increases the risk? Having someone in your family who has epilepsy. In this condition,  seizures happen again and again over time. They have no clear cause. Having had a tonic-clonic seizure before. This type of seizure causes you to: Tighten the muscles of the whole body. Lose consciousness. Having had a head injury or strokes before. Having had a lack of oxygen at birth. What are the signs or symptoms? There are many types of seizures. The symptoms vary depending on the type of seizure you have. Symptoms during a seizure Shaking that you cannot control (convulsions) with fast, jerky movements of muscles. Stiffness of the body. Breathing problems. Feeling mixed up (confused). Staring or not responding to sound or touch. Head nodding. Eyes that blink, flutter, or move fast. Drooling, grunting, or making clicking sounds with your mouth Losing control of when you pee or poop. Symptoms before a seizure Feeling afraid, nervous, or worried. Feeling like you may vomit. Feeling like: You are moving when you are not. Things around you are moving when they are not. Feeling like you saw or heard something before (dj vu). Odd tastes or smells. Changes in how you see. You may see flashing lights or spots. Symptoms after a seizure Feeling confused. Feeling sleepy. Headache. Sore muscles. How is this treated? If your seizure stops on its own, you will not need treatment. If your seizure lasts longer than 5 minutes, you will normally need treatment. Treatment may include: Medicines given through an IV tube. Avoiding things, such as medicines, that are known to cause your seizures. Medicines to prevent seizures. A device to prevent or control seizures. Surgery. A diet low in carbohydrates and high in fat (ketogenic diet). Follow these instructions at home: Medicines Take  over-the-counter and prescription medicines only as told by your doctor. Avoid foods or drinks that may keep your medicine from working, such as alcohol. Activity Follow instructions about driving,  swimming, or doing things that would be dangerous if you had another seizure. Wait until your doctor says it is safe for you to do these things. If you live in the [place] ask your local department of motor vehicles when you can drive. Get a lot of rest. Teaching others  Teach friends and family what to do when you have a seizure. They should: Help you get down to the ground. Protect your head and body. Loosen any clothing around your neck. Turn you on your side. Know whether or not you need emergency care. Stay with you until you are better. Also, tell them what not to do if you have a seizure. Tell them: They should not hold you down. They should not put anything in your mouth. General instructions Avoid anything that gives you seizures. Keep a seizure diary. Write down: What you remember about each seizure. What you think caused each seizure. Keep all follow-up visits. Contact a doctor if: You have another seizure or seizures. Call the doctor each time you have a seizure. The pattern of your seizures changes. You keep having seizures with treatment. You have symptoms of being sick or having an infection. You are not able to take your medicine. Get help right away if: You have any of these problems: A seizure that lasts longer than 5 minutes. Many seizures in a row and you do not feel better between seizures. A seizure that makes it harder to breathe. A seizure and you can no longer speak or use part of your body. You do not wake up right after a seizure. You get hurt during a seizure. You feel confused or have pain right after a seizure. These symptoms may be an emergency. Get help right away. Call your local emergency services (911 in the [place] Do not wait to see if the symptoms will go away. Do not drive yourself to the hospital. Summary A seizure is a sudden burst of abnormal electrical and chemical activity in the brain. Seizures normally last from 30 seconds to 2  minutes. Causes of seizures include illness, injury to the head, low levels of blood sugar or salt, and certain conditions. Most seizures will stop on their own in less than 5 minutes. Seizures that last longer than 5 minutes are a medical emergency and need treatment right away. Many medicines are used to treat seizures. Take over-the-counter and prescription medicines only as told by your doctor. This information is not intended to replace advice given to you by your health care provider. Make sure you discuss any questions you have with your health care provider. Document Revised: 11/23/2019 Document Reviewed: 11/23/2019 Elsevier Patient Education  [employer].

## 2022-09-03 ENCOUNTER — Telehealth (HOSPITAL_COMMUNITY): Payer: Self-pay | Admitting: *Deleted

## 2022-09-03 NOTE — Telephone Encounter (Signed)
Magdalyn completed her follow up appt with Dr. Pearlean Brownie on 4/4.  Reviewed progress note. Called and spoke to [facility] regarding cardiac rehab.  Outlined I have 2 concerns. 1. Per Dr. Pearlean Brownie note she should avoid "exertion" as this could trigger a seizure.  Unclear if general exercise would be included.  Our gym has fluorescent light no strobes or flashing and music is played over the audio system.  Not knowing if this could also be a trigger.  2. No driving for 6 months per [place] driving law. Pt husband has memory problems and possible dementia. Unclear if he would be able to drive her if participation would be permitted. Talked with pt regarding this.  Pt verbalized understanding of this as well from her take away from yesterday office visit.  Decided to see how the upcoming EEG and plan of care.  Would need Dr. Pearlean Brownie to outline what is meant by exertion and the environmental impact in the gym ie lighting and noise.  Advised pt that she has up to a year to participate and certainly waiting  pass this 6 month period of time and any medication adjustment is an option. Alanson Aly, BSN Cardiac and Emergency planning/management officer

## 2022-09-20 ENCOUNTER — Ambulatory Visit: Payer: Medicare PPO | Admitting: Neurology

## 2022-09-20 DIAGNOSIS — G40109 Localization-related (focal) (partial) symptomatic epilepsy and epileptic syndromes with simple partial seizures, not intractable, without status epilepticus: Secondary | ICD-10-CM | POA: Diagnosis not present

## 2022-09-27 ENCOUNTER — Telehealth: Payer: Self-pay | Admitting: Neurology

## 2022-09-27 NOTE — Telephone Encounter (Signed)
EEG completed on 4/22 and has not yet been reviewed by Dr. Pearlean Brownie. I will let him know that patient is inquiring.

## 2022-09-27 NOTE — Telephone Encounter (Signed)
Pt is asking to be called with results to her EEG 

## 2022-09-29 ENCOUNTER — Telehealth: Payer: Self-pay | Admitting: Neurology

## 2022-09-29 DIAGNOSIS — Z8673 Personal history of transient ischemic attack (TIA), and cerebral infarction without residual deficits: Secondary | ICD-10-CM

## 2022-09-29 NOTE — Telephone Encounter (Signed)
Pt is asking to be called with results to her EEG 

## 2022-09-29 NOTE — Telephone Encounter (Signed)
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 Any other imaging needed, given I see you saw her 09/02/22 for seizure? 2 cardiac stents placed Feb 2024.

## 2022-09-29 NOTE — Telephone Encounter (Signed)
Please result EEG when available  

## 2022-09-30 ENCOUNTER — Other Ambulatory Visit (INDEPENDENT_AMBULATORY_CARE_PROVIDER_SITE_OTHER): Payer: Self-pay

## 2022-09-30 DIAGNOSIS — R799 Abnormal finding of blood chemistry, unspecified: Secondary | ICD-10-CM | POA: Diagnosis not present

## 2022-09-30 DIAGNOSIS — Z8673 Personal history of transient ischemic attack (TIA), and cerebral infarction without residual deficits: Secondary | ICD-10-CM

## 2022-09-30 DIAGNOSIS — Z0289 Encounter for other administrative examinations: Secondary | ICD-10-CM

## 2022-09-30 NOTE — Addendum Note (Signed)
Addended by: Glean Salvo on: 09/30/2022 08:27 AM   Modules accepted: Orders

## 2022-09-30 NOTE — Telephone Encounter (Addendum)
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  Would advise recheck of creatinine before hand, I placed the order, she can come by at any time. 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    Answer:   No    Order Specific Question:   Preferred imaging location?    Answer:   [facility]   Creatinine, Serum    Standing Status:   Future    Standing Expiration Date:   09/30/2023

## 2022-09-30 NOTE — Telephone Encounter (Signed)
Called patient and she will be here sometime next week for labs and will wait to be notified of next steps for scan. Pt verbalized understanding. Pt had no questions at this time but was encouraged to call back if questions arise.

## 2022-09-30 NOTE — Telephone Encounter (Signed)
Called the pt and reviewed the EEG results with her. Informed her there was slowing on the brain but no concerns for seizure like activity. She asked should she continue medications and I advised that she should stay on the medication for now. She has a follow up in aug with NP. Pt verbalized understanding. Pt had no questions at this time but was encouraged to call back if questions arise.   "Per Pearlean Brownie  "Kindly inform the patient that EEG or brainwave study shows mild slowing of brain activity which is nothing to worry about and may be related to her age.  No definite seizure activity noted"

## 2022-10-01 LAB — CREATININE, SERUM
Creatinine, Ser: 1.12 mg/dL — ABNORMAL HIGH (ref 0.57–1.00)
eGFR: 49 mL/min/{1.73_m2} — ABNORMAL LOW (ref >59)

## 2022-10-01 NOTE — Progress Notes (Signed)
Kindly inform the patient that EEG or brainwave study was fine.  No evidence of seizure activity.  Mild slowing of brainwave activity may be seen with age or variety of other conditions.  Nothing to worry about

## 2022-10-04 ENCOUNTER — Telehealth: Payer: Self-pay | Admitting: Neurology

## 2022-10-04 ENCOUNTER — Telehealth: Payer: Self-pay

## 2022-10-04 NOTE — Telephone Encounter (Signed)
-----   Message from Deatra James, RN sent at 10/04/2022  9:32 AM EDT -----  ----- Message ----- From: Micki Riley, MD Sent: 10/01/2022   8:20 AM EDT To: Gna-Pod 2 Results  Kindly inform the patient that EEG or brainwave study was fine.  No evidence of seizure activity.  Mild slowing of brainwave activity may be seen with age or variety of other conditions.  Nothing to worry about

## 2022-10-04 NOTE — Telephone Encounter (Signed)
Tiffany Mayo: [id number] exp. 10/04/22-11/03/22 sent to GI [phone]

## 2022-10-07 ENCOUNTER — Ambulatory Visit
Admission: RE | Admit: 2022-10-07 | Discharge: 2022-10-07 | Disposition: A | Discharge status: Home / Self Care | Payer: Medicare PPO | Source: Ambulatory Visit | Attending: Neurology | Admitting: Neurology

## 2022-10-07 DIAGNOSIS — I720 Aneurysm of carotid artery: Secondary | ICD-10-CM | POA: Diagnosis not present

## 2022-10-07 DIAGNOSIS — Z8673 Personal history of transient ischemic attack (TIA), and cerebral infarction without residual deficits: Secondary | ICD-10-CM

## 2022-10-07 DIAGNOSIS — I700 Atherosclerosis of aorta: Secondary | ICD-10-CM | POA: Diagnosis not present

## 2022-10-07 MED ORDER — IOPAMIDOL (ISOVUE-370) INJECTION 76%
75.0000 mL | Freq: Once | INTRAVENOUS | Status: AC | PRN
  Administered 2022-10-07: 75 mL via INTRAVENOUS

## 2022-10-10 ENCOUNTER — Telehealth: Payer: Self-pay | Admitting: Cardiology

## 2022-10-10 NOTE — Telephone Encounter (Signed)
On-call outpatient service line: CC: Bruising on Plavix  Patient was seen in February 2024 and had been placed on DAPT therapy with aspirin and Plavix for at least 6 months uninterrupted after PCI for CAD.  Patient is now calling with concerns of minimal bleeding and bruising.  I have assured her that some bleeding and bruising can occur whenever she has trauma to the skin when on these medications. She denied any hematuria, hematemesis, hematochezia.  I have instructed patient to reach out to office or on call line if bleeding and bruising becomes more significant or if she has had any of the aforementioned symptoms.

## 2022-10-11 ENCOUNTER — Telehealth: Payer: Self-pay | Admitting: Neurology

## 2022-10-11 NOTE — Telephone Encounter (Signed)
Pt called. Stated she is following up on CT results.

## 2022-10-11 NOTE — Telephone Encounter (Signed)
Returned call to pt and stated that Maralyn Sago hasn't reviewed result or made recommendations yet and that we will contact them once she does. The pt voiced understanding.

## 2022-10-12 ENCOUNTER — Encounter: Payer: Self-pay | Admitting: Neurology

## 2022-10-13 ENCOUNTER — Telehealth (HOSPITAL_COMMUNITY): Payer: Self-pay | Admitting: *Deleted

## 2022-10-13 NOTE — Telephone Encounter (Signed)
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  Will repeat CTA head in 6 to 12 months given possible increase in size of left cavernous ICA aneurysm.  Since incidentally found, and small, will hold off on referral to interventional radiology.   IMPRESSION: 1. Unchanged to minimally increased size of a 3 mm left cavernous ICA aneurysm. 2. 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 3. Mild atherosclerosis in the head and neck without large vessel occlusion or significant proximal stenosis.

## 2022-10-13 NOTE — Telephone Encounter (Signed)
-----   Message from Micki Riley, MD sent at 10/13/2022  7:09 AM EDT ----- Regarding: RE: Guidance needed regarding this pt participating in group setting for exercise Yes okay to participate ----- Message ----- From: Chelsea Aus, RN Sent: 10/01/2022   2:46 PM EDT To: Micki Riley, MD Subject: Guidance needed regarding this pt participat#  Dr. Pearlean Brownie,  the above pt is eligible to participate in Cardiac Rehab.  Presently on hold pending neuro consult.  Pt seen by you on 4/4 and had EEG on 4/22.  In cardiac rehab with do have lights and large amount of natural light.  We do not have blinking or strobe lights.  Music is played through overhead speakers and this is a group setting with staff on the floor but not on a one on one basis.    Based upon your outline of post seizure restrictions of activity,  may this pt participate in Cardiac rehab?  Your thoughts Pharmacist, hospital, BSN Cardiac and Emergency planning/management officer

## 2022-10-18 ENCOUNTER — Telehealth (HOSPITAL_COMMUNITY): Payer: Self-pay | Admitting: *Deleted

## 2022-10-18 NOTE — Telephone Encounter (Signed)
-----   Message from Micki Riley, MD sent at 10/15/2022  8:23 AM EDT ----- Regarding: RE: Guidance needed regarding this pt participating in group setting for exercise Patient is cleared to start cardiac rehab ----- Message ----- From: Chelsea Aus, RN Sent: 10/01/2022   2:46 PM EDT To: Micki Riley, MD Subject: Guidance needed regarding this pt participat#  Dr. Pearlean Brownie,  the above pt is eligible to participate in Cardiac Rehab.  Presently on hold pending neuro consult.  Pt seen by you on 4/4 and had EEG on 4/22.  In cardiac rehab with do have lights and large amount of natural light.  We do not have blinking or strobe lights.  Music is played through overhead speakers and this is a group setting with staff on the floor but not on a one on one basis.    Based upon your outline of post seizure restrictions of activity,  may this pt participate in Cardiac rehab?  Your thoughts Pharmacist, hospital, BSN Cardiac and Emergency planning/management officer

## 2022-11-03 ENCOUNTER — Other Ambulatory Visit: Payer: Self-pay | Admitting: Internal Medicine

## 2022-11-03 DIAGNOSIS — Z1231 Encounter for screening mammogram for malignant neoplasm of breast: Secondary | ICD-10-CM

## 2022-11-15 DIAGNOSIS — J309 Allergic rhinitis, unspecified: Secondary | ICD-10-CM | POA: Diagnosis not present

## 2022-11-15 DIAGNOSIS — M8580 Other specified disorders of bone density and structure, unspecified site: Secondary | ICD-10-CM | POA: Diagnosis not present

## 2022-11-15 DIAGNOSIS — K219 Gastro-esophageal reflux disease without esophagitis: Secondary | ICD-10-CM | POA: Diagnosis not present

## 2022-11-15 DIAGNOSIS — N1831 Chronic kidney disease, stage 3a: Secondary | ICD-10-CM | POA: Diagnosis not present

## 2022-11-15 DIAGNOSIS — I25119 Atherosclerotic heart disease of native coronary artery with unspecified angina pectoris: Secondary | ICD-10-CM | POA: Diagnosis not present

## 2022-11-15 DIAGNOSIS — E785 Hyperlipidemia, unspecified: Secondary | ICD-10-CM | POA: Diagnosis not present

## 2022-11-15 DIAGNOSIS — E039 Hypothyroidism, unspecified: Secondary | ICD-10-CM | POA: Diagnosis not present

## 2022-11-15 DIAGNOSIS — I129 Hypertensive chronic kidney disease with stage 1 through stage 4 chronic kidney disease, or unspecified chronic kidney disease: Secondary | ICD-10-CM | POA: Diagnosis not present

## 2022-11-15 DIAGNOSIS — M1990 Unspecified osteoarthritis, unspecified site: Secondary | ICD-10-CM | POA: Diagnosis not present

## 2022-11-17 DIAGNOSIS — E782 Mixed hyperlipidemia: Secondary | ICD-10-CM | POA: Diagnosis not present

## 2022-11-17 LAB — LIPID PANEL
Chol/HDL Ratio: 2.8 ratio (ref 0.0–4.4)
Cholesterol, Total: 125 mg/dL (ref 100–199)
HDL: 45 mg/dL (ref >39)
LDL Chol Calc (NIH): 62 mg/dL (ref 0–99)
Triglycerides: 93 mg/dL (ref 0–149)
VLDL Cholesterol Cal: 18 mg/dL (ref 5–40)

## 2022-11-21 NOTE — Progress Notes (Signed)
Cardiology Office Note:   Date:  11/24/2022  NAME:  Tiffany Mayo    MRN: [id number] DOB:  November 20, 1940   PCP:  Gaspar Garbe, MD  Cardiologist:  Reatha Harps, MD  Electrophysiologist:  None   Referring MD: Gaspar Garbe, MD   Chief Complaint  Patient presents with   Follow-up         History of Present Illness:   Tiffany Mayo is a 82 y.o. female with a hx of CAD, HLD who presents for follow-up.  She reports she is doing well.  She was actually seen in the ER in [place] in March.  Questionable diagnosis of TIA versus seizure disorder.  She has been following with neurology.  She is now on seizure medication.  She reports low energy and fatigue.  I suspect this is a side effect of her seizure medication.  Blood pressure is well-controlled.  No symptoms of chest pain.  Can still get short of breath with activity but symptoms are improving.  I encouraged her to remain active.  Vital signs are stable.  CV exam is normal.  She had an EKG in [place] that showed sinus bradycardia.  Pulse in the 60s.  On a AV nodal agents.  Overall doing well.  LDL cholesterol is at goal.  Problem List CAD -60% pLAD -mid LAD 80% -> PCI 07/21/2022 -LCX/OM1 90% -> PCI 07/21/2022 2. HLD  -T chol 125, HDL 45, LDL 62, TG 93 3. TIA 4. Seizure disorder -brain MRI 07/2022 microvascular disease   Past Medical History: Past Medical History:  Diagnosis Date   Arthritis    spine, neck   Coronary artery disease    GERD (gastroesophageal reflux disease)    Headache    SINUS    Hemorrhoid    Hyperlipemia    Hypertension    Hypothyroidism    Seizures (HCC)     Past Surgical History: Past Surgical History:  Procedure Laterality Date   ABDOMINAL HYSTERECTOMY  1990   ANTERIOR CERVICAL DECOMP/DISCECTOMY FUSION  04/03/2012   Procedure: ANTERIOR CERVICAL DECOMPRESSION/DISCECTOMY FUSION 1 LEVEL;  Surgeon: Hewitt Shorts, MD;  Location: [facility];  Service: Neurosurgery;  Laterality: N/A;   Cervical Five-Six Anterior Cervical Decompression with Fusion Plating and Bonegraft   BUNIONECTOMY     RIGHT FOOT   CARDIAC CATHETERIZATION     CORONARY STENT INTERVENTION N/A 07/21/2022   Procedure: CORONARY STENT INTERVENTION;  Surgeon: Tonny Bollman, MD;  Location: [facility] INVASIVE CV LAB;  Service: Cardiovascular;  Laterality: N/A;   EYE SURGERY     Lasik, Catarct   GANGLION CYST EXCISION     BIL WRIST    JOINT REPLACEMENT     Right middle finger   LEFT HEART CATH AND CORONARY ANGIOGRAPHY N/A 07/08/2022   Procedure: LEFT HEART CATH AND CORONARY ANGIOGRAPHY;  Surgeon: Lennette Bihari, MD;  Location: [facility] INVASIVE CV LAB;  Service: Cardiovascular;  Laterality: N/A;   TRIGGER FINGER RELEASE     thumbs    Current Medications: Current Meds  Medication Sig   aspirin EC 81 MG tablet Take 1 tablet (81 mg total) by mouth daily. Swallow whole.   atorvastatin (LIPITOR) 80 MG tablet Take 80 mg by mouth in the morning.   clopidogrel (PLAVIX) 75 MG tablet Take 1 tablet (75 mg total) by mouth daily.   escitalopram (LEXAPRO) 5 MG tablet Take 5 mg by mouth in the morning.   isosorbide mononitrate (IMDUR) 30 MG 24 hr tablet Take  0.5 tablets (15 mg total) by mouth daily.   levETIRAcetam (KEPPRA XR) 500 MG 24 hr tablet Take 1 tablet (500 mg total) by mouth daily.   levothyroxine (SYNTHROID) 50 MCG tablet Take 50 mcg by mouth every other day.   levothyroxine (SYNTHROID) 75 MCG tablet Take 75 mcg by mouth every other day.   olmesartan (BENICAR) 20 MG tablet Take 20 mg by mouth in the morning.   pantoprazole (PROTONIX) 40 MG tablet Take 1 tablet (40 mg total) by mouth in the morning.   Polyethyl Glycol-Propyl Glycol (SYSTANE ULTRA) 0.4-0.3 % SOLN Place 1-2 drops into both eyes 3 (three) times daily as needed (dry/irritated eyes.).     Allergies:    Latex, Codeine, and Sulfa antibiotics   Social History: Social History   Socioeconomic History   Marital status: Married    Spouse name: Not on file    Number of children: 2   Years of education: Not on file   Highest education level: Not on file  Occupational History   Occupation: Runner, broadcasting/film/video First Grade - Retired  Tobacco Use   Smoking status: Never   Smokeless tobacco: Never  Substance and Sexual Activity   Alcohol use: Yes    Comment: OCC WINE    Drug use: No   Sexual activity: Not on file  Other Topics Concern   Not on file  Social History Narrative   Not on file   Social Determinants of Health   Financial Resource Strain: Not on file  Food Insecurity: No Food Insecurity (07/21/2022)   Hunger Vital Sign    Worried About Running Out of Food in the Last Year: Never true    Ran Out of Food in the Last Year: Never true  Transportation Needs: No Transportation Needs (07/21/2022)   PRAPARE - Administrator, Civil Service (Medical): No    Lack of Transportation (Non-Medical): No  Physical Activity: Not on file  Stress: Not on file  Social Connections: Not on file     Family History: The patient's family history includes Heart disease in her father.  ROS:   All other ROS reviewed and negative. Pertinent positives noted in the HPI.     EKGs/Labs/Other Studies Reviewed:   The following studies were personally reviewed by me today:  EKG:  EKG is not ordered today.        Recent Labs: 04/07/2022: ALT 14 04/08/2022: TSH 4.968 07/22/2022: BUN 19; Hemoglobin 11.5; Platelets 159; Potassium 3.8; Sodium 140 09/30/2022: Creatinine, Ser 1.12   Recent Lipid Panel    Component Value Date/Time   CHOL 125 11/17/2022 0856   TRIG 93 11/17/2022 0856   HDL 45 11/17/2022 0856   CHOLHDL 2.8 11/17/2022 0856   CHOLHDL 4.6 04/08/2022 0000   VLDL 21 04/08/2022 0000   LDLCALC 62 11/17/2022 0856    Physical Exam:   VS:  BP 118/62 (BP Location: Left Arm, Patient Position: Sitting, Cuff Size: Normal)   Pulse 62   Ht 5' 1.5" (1.562 m)   Wt 139 lb 12.8 oz (63.4 kg)   SpO2 98%   BMI 25.99 kg/m    Wt Readings from Last 3  Encounters:  11/24/22 139 lb 12.8 oz (63.4 kg)  09/02/22 140 lb (63.5 kg)  08/26/22 139 lb (63 kg)    General: Well nourished, well developed, in no acute distress Head: Atraumatic, normal size  Eyes: PEERLA, EOMI  Neck: Supple, no JVD Endocrine: No thryomegaly Cardiac: Normal S1, S2; RRR; no murmurs, rubs,  or gallops Lungs: Clear to auscultation bilaterally, no wheezing, rhonchi or rales  Abd: Soft, nontender, no hepatomegaly  Ext: No edema, pulses 2+ Musculoskeletal: No deformities, BUE and BLE strength normal and equal Skin: Warm and dry, no rashes   Neuro: Alert and oriented to Mayo, place, time, and situation, CNII-XII grossly intact, no focal deficits  Psych: Normal mood and affect   ASSESSMENT:   Tiffany Mayo is a 82 y.o. female who presents for the following: 1. Coronary artery disease involving native coronary artery of native heart with other form of angina pectoris (HCC)   2. Hyperlipidemia LDL goal <70     PLAN:   1. Coronary artery disease involving native coronary artery of native heart with other form of angina pectoris (HCC) 2. Hyperlipidemia LDL goal <70 -Status post PCI to the mid LAD as well as circumflex and OM on 07/21/2022.  She will complete 6 months of DAPT.  She can stop her Plavix on 01/19/2023.  She will continue aspirin and Lipitor.  LDL is at goal.  She is on Imdur.  Suspect this can be weaned off in the next visit or so.  CV exam normal.  No symptoms of angina.  I encouraged her to remain active.  I think some of her fatigue and low energy is related to her seizure medication.  She will work with her neurologist on that.  She overall seems to be doing well from a cardiovascular standpoint.  74-month follow-up  Disposition: Return in about 6 months (around 05/26/2023).  Medication Adjustments/Labs and Tests Ordered: Current medicines are reviewed at length with the patient today.  Concerns regarding medicines are outlined above.  No orders of the  defined types were placed in this encounter.  No orders of the defined types were placed in this encounter.  Patient Instructions  Medication Instructions:  STOP YOUR PLAVIX ON 01-19-2023 *If you need a refill on your cardiac medications before your next appointment, please call your pharmacy*  Lab Work: NONE ordered at this time of appointment   If you have labs (blood work) drawn today and your tests are completely normal, you will receive your results only by:  MyChart Message (if you have MyChart) OR  A paper copy in the mail If you have any lab test that is abnormal or we need to change your treatment, we will call you to review the results.  Testing/Procedures: NONE ordered at this time of appointment    Follow-Up: At [facility], you and your health needs are our priority.  As part of our continuing mission to provide you with exceptional heart care, we have created designated Provider Care Teams.  These Care Teams include your primary Cardiologist (physician) and Advanced Practice Providers (APPs -  Physician Assistants and Nurse Practitioners) who all work together to provide you with the care you need, when you need it.  Your next appointment:   6 month(s)  Provider:   Reatha Harps, MD     Other Instructions    Time Spent with Patient: I have spent a total of 25 minutes with patient reviewing hospital notes, telemetry, EKGs, labs and examining the patient as well as establishing an assessment and plan that was discussed with the patient.  > 50% of time was spent in direct patient care.  Signed, Lenna Gilford. Flora Lipps, MD, [facility]  [facility]  [address], [address] [place], [place] [address] [phone]  11/24/2022 9:18 AM

## 2022-11-22 ENCOUNTER — Ambulatory Visit
Admission: RE | Admit: 2022-11-22 | Discharge: 2022-11-22 | Disposition: A | Discharge status: Home / Self Care | Payer: Medicare PPO | Source: Ambulatory Visit | Attending: Internal Medicine | Admitting: Internal Medicine

## 2022-11-22 DIAGNOSIS — Z1231 Encounter for screening mammogram for malignant neoplasm of breast: Secondary | ICD-10-CM | POA: Diagnosis not present

## 2022-11-24 ENCOUNTER — Ambulatory Visit: Payer: Medicare PPO | Admitting: Cardiovascular Disease

## 2022-11-24 ENCOUNTER — Encounter: Payer: Self-pay | Admitting: Cardiovascular Disease

## 2022-11-24 ENCOUNTER — Ambulatory Visit: Payer: Medicare PPO | Attending: Cardiovascular Disease | Admitting: Cardiovascular Disease

## 2022-11-24 VITALS — BP 118/62 | HR 62 | Ht 61.5 in | Wt 139.8 lb

## 2022-11-24 DIAGNOSIS — I25118 Atherosclerotic heart disease of native coronary artery with other forms of angina pectoris: Secondary | ICD-10-CM | POA: Diagnosis not present

## 2022-11-24 DIAGNOSIS — E785 Hyperlipidemia, unspecified: Secondary | ICD-10-CM | POA: Diagnosis not present

## 2022-11-24 DIAGNOSIS — H5213 Myopia, bilateral: Secondary | ICD-10-CM | POA: Diagnosis not present

## 2022-11-24 DIAGNOSIS — H52223 Regular astigmatism, bilateral: Secondary | ICD-10-CM | POA: Diagnosis not present

## 2022-11-24 DIAGNOSIS — Z961 Presence of intraocular lens: Secondary | ICD-10-CM | POA: Diagnosis not present

## 2022-11-24 DIAGNOSIS — H524 Presbyopia: Secondary | ICD-10-CM | POA: Diagnosis not present

## 2022-11-24 NOTE — Patient Instructions (Addendum)
Medication Instructions:  STOP YOUR PLAVIX ON 01-19-2023 *If you need a refill on your cardiac medications before your next appointment, please call your pharmacy*  Lab Work: NONE ordered at this time of appointment   If you have labs (blood work) drawn today and your tests are completely normal, you will receive your results only by:  MyChart Message (if you have MyChart) OR  A paper copy in the mail If you have any lab test that is abnormal or we need to change your treatment, we will call you to review the results.  Testing/Procedures: NONE ordered at this time of appointment    Follow-Up: At [facility], you and your health needs are our priority.  As part of our continuing mission to provide you with exceptional heart care, we have created designated Provider Care Teams.  These Care Teams include your primary Cardiologist (physician) and Advanced Practice Providers (APPs -  Physician Assistants and Nurse Practitioners) who all work together to provide you with the care you need, when you need it.  Your next appointment:   6 month(s)  Provider:   Reatha Harps, MD     Other Instructions

## 2022-12-06 ENCOUNTER — Telehealth (HOSPITAL_COMMUNITY): Payer: Self-pay

## 2022-12-06 ENCOUNTER — Encounter (HOSPITAL_COMMUNITY): Payer: Self-pay

## 2022-12-06 NOTE — Telephone Encounter (Signed)
Called patient to see if she was interested in participating in the Cardiac Rehab Program. Patient stated yes. Patient will come in for orientation on 7/9@1 :15 and will attend the 1:45 exercise class.   Sent package.

## 2022-12-06 NOTE — Telephone Encounter (Signed)
Pt insurance is active and benefits verified through [facility] Co-pay $20, DED 0/0 met, out of pocket $4,000/$980.23 met, co-insurance 0%. no pre-authorization required. Passport, 12/06/2022@8 :36, REF# [id number]   How many CR sessions are covered? (for ICR)no limit Is this a lifetime maximum or an annual maximum? annual Has the member used any of these services to date? no Is there a time limit (weeks/months) on start of program and/or program completion? no

## 2022-12-07 ENCOUNTER — Telehealth (HOSPITAL_COMMUNITY): Payer: Self-pay

## 2022-12-07 ENCOUNTER — Encounter (HOSPITAL_COMMUNITY)
Admission: RE | Admit: 2022-12-07 | Discharge: 2022-12-07 | Disposition: A | Discharge status: Home / Self Care | Payer: Medicare PPO | Source: Ambulatory Visit | Attending: Cardiovascular Disease | Admitting: Cardiovascular Disease

## 2022-12-07 VITALS — BP 112/68 | HR 63 | Ht 62.75 in | Wt 142.0 lb

## 2022-12-07 DIAGNOSIS — Z955 Presence of coronary angioplasty implant and graft: Secondary | ICD-10-CM | POA: Insufficient documentation

## 2022-12-07 NOTE — Progress Notes (Signed)
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 Swallow whole., Disp: 30 tablet, Rfl: 11   atorvastatin (LIPITOR) 80 MG tablet, Take 80 mg by mouth in the morning., Disp: , Rfl:    clopidogrel (PLAVIX) 75 MG tablet, Take 1 tablet (75 mg total) by mouth daily., Disp: 30 tablet, Rfl: 11   cycloSPORINE (RESTASIS) 0.05 % ophthalmic emulsion, Place 1 drop into both eyes 2 (two) times daily., Disp: , Rfl:    escitalopram (LEXAPRO) 5 MG tablet, Take 5 mg by mouth in the morning., Disp: , Rfl:    isosorbide mononitrate (IMDUR) 30 MG 24 hr tablet, Take 0.5 tablets (15 mg total) by mouth daily., Disp: 45 tablet, Rfl: 3   levETIRAcetam (KEPPRA XR) 500 MG 24 hr tablet, Take 1 tablet (500 mg total) by mouth daily., Disp: 30 tablet, Rfl: 5   levothyroxine (SYNTHROID) 50 MCG tablet, Take 50 mcg by mouth every other day., Disp: , Rfl:    levothyroxine (SYNTHROID) 75 MCG tablet, Take 75 mcg by mouth every other day., Disp: , Rfl:    olmesartan (BENICAR) 20 MG tablet, Take 20 mg by mouth in the morning., Disp: , Rfl:    pantoprazole (PROTONIX) 40 MG tablet, Take 1 tablet (40 mg total) by mouth in the morning., Disp: 90  tablet, Rfl: 3   Polyethyl Glycol-Propyl Glycol (SYSTANE ULTRA) 0.4-0.3 % SOLN, Place 1-2 drops into both eyes 3 (three) times daily as needed (dry/irritated eyes.)., Disp: , Rfl:    Multiple Vitamins-Minerals (AIRBORNE PO), Take 3 tablets by mouth daily., Disp: , Rfl:    nitroGLYCERIN (NITROSTAT) 0.4 MG SL tablet, Place 1 tablet (0.4 mg total) under the tongue every 5 (five) minutes as needed for chest pain. (Patient not taking: Reported on 11/24/2022), Disp: 90 tablet, Rfl: 3  Past Medical History: Past Medical History:  Diagnosis Date   Arthritis    spine, neck   Coronary artery disease    GERD (gastroesophageal reflux disease)    Headache    SINUS    Hemorrhoid    Hyperlipemia    Hypertension    Hypothyroidism    Seizures (HCC)     Tobacco Use: Social History   Tobacco Use  Smoking Status Never  Smokeless Tobacco Never    Labs: Review Flowsheet       Latest Ref Rng & Units 03/05/2016 08/04/2016 04/08/2022 11/17/2022  Labs for ITP Cardiac and Pulmonary Rehab  Cholestrol 100 - 199 mg/dL - - 409  811   LDL (calc) 0 - 99 mg/dL - - 914  62   HDL-C >78 mg/dL - - 44  45   Trlycerides 0 - 149 mg/dL - - 295  93   Hemoglobin A1c 4.8 - 5.6 % - - 5.0  -  Bicarbonate 20.0 - 28.0 mmol/L - 24.8  - -  TCO2 0 - 100 mmol/L 28  26  - -  O2 Saturation % - 96.0  - -    Capillary Blood Glucose: Lab Results  Component Value Date   GLUCAP 94 03/05/2016     Exercise Target Goals: Exercise Program Goal: Individual exercise prescription set using results from initial 6 min walk test and THRR while considering  patient's activity barriers and safety.   Exercise Prescription Goal: Initial exercise prescription builds to 30-45 minutes a day of aerobic activity, 2-3 days per week.  Home exercise guidelines will be given to patient during program as part of exercise prescription that the participant will acknowledge.  Activity Barriers & Risk Stratification:  Activity Barriers & Cardiac  Risk Stratification - 12/07/22 1538       Activity Barriers & Cardiac Risk Stratification   Activity Barriers Balance Concerns;Joint Problems;History of Falls;Neck/Spine Problems;Other (comment);Back Problems;Arthritis    Comments ? h/o seizures vs TIA    Cardiac Risk Stratification High             6 Minute Walk:  6 Minute Walk     Row Name 12/07/22 1443         6 Minute Walk   Phase Initial     Distance 1522 feet     Walk Time 6 minutes     # of Rest Breaks 0     MPH 2.9     METS 2.6     RPE 9     Perceived Dyspnea  0     VO2 Peak 9     Symptoms Yes (comment)     Comments Posterior bilateral neck pain 6/10     Resting HR 53 bpm     Resting BP 112/68     Resting Oxygen Saturation  97 %     Exercise Oxygen Saturation  during 6 min walk 97 %     Max Ex. HR 84 bpm     Max Ex. BP 140/70     2 Minute Post BP 124/68              Oxygen Initial Assessment:   Oxygen Re-Evaluation:   Oxygen Discharge (Final Oxygen Re-Evaluation):   Initial Exercise Prescription:  Initial Exercise Prescription - 12/07/22 1500       Date of Initial Exercise RX and Referring Provider   Date 12/07/22    Referring Provider Lennie Odor, Md    Expected Discharge Date 03/02/23      Recumbant Bike   Level 1    RPM 12    Watts 20    Minutes 15    METs 2.6      NuStep   Level 2    SPM 75    Minutes 15    METs 2.6      Prescription Details   Frequency (times per week) 3    Duration Progress to 30 minutes of continuous aerobic without signs/symptoms of physical distress      Intensity   THRR 40-80% of Max Heartrate 55-110    Ratings of Perceived Exertion 11-13    Perceived Dyspnea 0-4      Progression   Progression Continue progressive overload as per policy without signs/symptoms or physical distress.      Resistance Training   Training Prescription Yes    Weight 3 lbs  Reps 10-15             Perform Capillary Blood Glucose checks as  needed.  Exercise Prescription Changes:   Exercise Comments:   Exercise Goals and Review:   Exercise Goals     Row Name 12/07/22 1521             Exercise Goals   Increase Physical Activity Yes       Intervention Provide advice, education, support and counseling about physical activity/exercise needs.;Develop an individualized exercise prescription for aerobic and resistive training based on initial evaluation findings, risk stratification, comorbidities and participant's personal goals.       Expected Outcomes Short Term: Attend rehab on a regular basis to increase amount of physical activity.;Long Term: Exercising regularly at least 3-5 days a week.;Long Term: Add in home exercise to make exercise part of routine and to increase amount of physical activity.       Increase Strength and Stamina Yes       Intervention Provide advice, education, support and counseling about physical activity/exercise needs.;Develop an individualized exercise prescription for aerobic and resistive training based on initial evaluation findings, risk stratification, comorbidities and participant's personal goals.       Expected Outcomes Short Term: Increase workloads from initial exercise prescription for resistance, speed, and METs.;Short Term: Perform resistance training exercises routinely during rehab and add in resistance training at home;Long Term: Improve cardiorespiratory fitness, muscular endurance and strength as measured by increased METs and functional capacity ( )       Able to understand and use rate of perceived exertion (RPE) scale Yes       Intervention Provide education and explanation on how to use RPE scale       Expected Outcomes Short Term: Able to use RPE daily in rehab to express subjective intensity level;Long Term:  Able to use RPE to guide intensity level when exercising independently       Knowledge and understanding of Target Heart Rate Range (THRR) Yes       Intervention  Provide education and explanation of THRR including how the numbers were predicted and where they are located for reference       Expected Outcomes Short Term: Able to state/look up THRR;Long Term: Able to use THRR to govern intensity when exercising independently;Short Term: Able to use daily as guideline for intensity in rehab       Understanding of Exercise Prescription Yes       Intervention Provide education, explanation, and written materials on patient's individual exercise prescription       Expected Outcomes Short Term: Able to explain program exercise prescription;Long Term: Able to explain home exercise prescription to exercise independently                Exercise Goals Re-Evaluation :   Discharge Exercise Prescription (Final Exercise Prescription Changes):   Nutrition:  Target Goals: Understanding of nutrition guidelines, daily intake of sodium 1500mg , cholesterol 200mg , calories 30% from fat and 7% or less from saturated fats, daily to have 5 or more servings of fruits and vegetables.  Biometrics:  Pre Biometrics - 12/07/22 1350       Pre Biometrics   Waist Circumference 33 inches    Hip Circumference 40.25 inches    Waist to Hip Ratio 0.82 %    Triceps Skinfold 26 mm    % Body Fat 37.9 %    Grip Strength 18 kg    Flexibility 17 in    Single Leg  Stand 6.8 seconds              Nutrition Therapy Plan and Nutrition Goals:   Nutrition Assessments:  MEDIFICTS Score Key: ?70 Need to make dietary changes  40-70 Heart Healthy Diet ? 40 Therapeutic Level Cholesterol Diet    Picture Your Plate Scores: <40 Unhealthy dietary pattern with much room for improvement. 41-50 Dietary pattern unlikely to meet recommendations for good health and room for improvement. 51-60 More healthful dietary pattern, with some room for improvement.  >60 Healthy dietary pattern, although there may be some specific behaviors that could be improved.    Nutrition Goals  Re-Evaluation:   Nutrition Goals Re-Evaluation:   Nutrition Goals Discharge (Final Nutrition Goals Re-Evaluation):   Psychosocial: Target Goals: Acknowledge presence or absence of significant depression and/or stress, maximize coping skills, provide positive support system. Participant is able to verbalize types and ability to use techniques and skills needed for reducing stress and depression.  Initial Review & Psychosocial Screening:  Initial Psych Review & Screening - 12/07/22 1534       Initial Review   Current issues with Current Sleep Concerns;Current Stress Concerns    Source of Stress Concerns Family    Comments Pt recently diagnosed with Dementia      Family Dynamics   Good Support System? Yes   Pt has son/daughter-in-law, sister, neighbors, church family     Barriers   Psychosocial barriers to participate in program The patient should benefit from training in stress management and relaxation.      Screening Interventions   Interventions Encouraged to exercise             Quality of Life Scores:  Quality of Life - 12/07/22 1550       Quality of Life   Select Quality of Life      Quality of Life Scores   Health/Function Pre 25.47 %    Socioeconomic Pre 26.92 %    Psych/Spiritual Pre 27.43 %    Family Pre 28.8 %    GLOBAL Pre 26.65 %            Scores of 19 and below usually indicate a poorer quality of life in these areas.  A difference of  2-3 points is a clinically meaningful difference.  A difference of 2-3 points in the total score of the Quality of Life Index has been associated with significant improvement in overall quality of life, self-image, physical symptoms, and general health in studies assessing change in quality of life.  PHQ-9: Review Flowsheet       12/07/2022  Depression screen PHQ 2/9  Decreased Interest 1  Down, Depressed, Hopeless 0  PHQ - 2 Score 1  Altered sleeping 2  Tired, decreased energy 2  Change in appetite 1   Feeling bad or failure about yourself  0  Trouble concentrating 0  Moving slowly or fidgety/restless 0  Suicidal thoughts 0  PHQ-9 Score 6  Difficult doing work/chores Not difficult at all   Interpretation of Total Score  Total Score Depression Severity:  1-4 = Minimal depression, 5-9 = Mild depression, 10-14 = Moderate depression, 15-19 = Moderately severe depression, 20-27 = Severe depression   Psychosocial Evaluation and Intervention:   Psychosocial Re-Evaluation:   Psychosocial Discharge (Final Psychosocial Re-Evaluation):   Vocational Rehabilitation: Provide vocational rehab assistance to qualifying candidates.   Vocational Rehab Evaluation & Intervention:  Vocational Rehab - 12/07/22 1537       Initial Vocational Rehab Evaluation & Intervention  Assessment shows need for Vocational Rehabilitation No   Pt is a retired Catering manager: Education Goals: Education classes will be provided on a weekly basis, covering required topics. Participant will state understanding/return demonstration of topics presented.     Core Videos: Exercise    Move It!  Clinical staff conducted group or individual video education with verbal and written material and guidebook.  Patient learns the recommended Pritikin exercise program. Exercise with the goal of living a long, healthy life. Some of the health benefits of exercise include controlled diabetes, healthier blood pressure levels, improved cholesterol levels, improved heart and lung capacity, improved sleep, and better body composition. Everyone should speak with their doctor before starting or changing an exercise routine.  Biomechanical Limitations Clinical staff conducted group or individual video education with verbal and written material and guidebook.  Patient learns how biomechanical limitations can impact exercise and how we can mitigate and possibly overcome limitations to have an impactful and  balanced exercise routine.  Body Composition Clinical staff conducted group or individual video education with verbal and written material and guidebook.  Patient learns that body composition (ratio of muscle mass to fat mass) is a key component to assessing overall fitness, rather than body weight alone. Increased fat mass, especially visceral belly fat, can put [place] at increased risk for metabolic syndrome, type 2 diabetes, heart disease, and even death. It is recommended to combine diet and exercise (cardiovascular and resistance training) to improve your body composition. Seek guidance from your physician and exercise physiologist before implementing an exercise routine.  Exercise Action Plan Clinical staff conducted group or individual video education with verbal and written material and guidebook.  Patient learns the recommended strategies to achieve and enjoy long-term exercise adherence, including variety, self-motivation, self-efficacy, and positive decision making. Benefits of exercise include fitness, good health, weight management, more energy, better sleep, less stress, and overall well-being.  Medical   Heart Disease Risk Reduction Clinical staff conducted group or individual video education with verbal and written material and guidebook.  Patient learns our heart is our most vital organ as it circulates oxygen, nutrients, white blood cells, and hormones throughout the entire body, and carries waste away. Data supports a plant-based eating plan like the Pritikin Program for its effectiveness in slowing progression of and reversing heart disease. The video provides a number of recommendations to address heart disease.   Metabolic Syndrome and Belly Fat  Clinical staff conducted group or individual video education with verbal and written material and guidebook.  Patient learns what metabolic syndrome is, how it leads to heart disease, and how one can reverse it and keep it from coming  back. You have metabolic syndrome if you have 3 of the following 5 criteria: abdominal obesity, high blood pressure, high triglycerides, low HDL cholesterol, and high blood sugar.  Hypertension and Heart Disease Clinical staff conducted group or individual video education with verbal and written material and guidebook.  Patient learns that high blood pressure, or hypertension, is very common in the [place]. Hypertension is largely due to excessive salt intake, but other important risk factors include being overweight, physical inactivity, drinking too much alcohol, smoking, and not eating enough potassium from fruits and vegetables. High blood pressure is a leading risk factor for heart attack, stroke, congestive heart failure, dementia, kidney failure, and premature death. Long-term effects of excessive salt intake include stiffening of the arteries and thickening of heart  muscle and organ damage. Recommendations include ways to reduce hypertension and the risk of heart disease.  Diseases of Our Time - Focusing on Diabetes Clinical staff conducted group or individual video education with verbal and written material and guidebook.  Patient learns why the best way to stop diseases of our time is prevention, through food and other lifestyle changes. Medicine (such as prescription pills and surgeries) is often only a Band-Aid on the problem, not a long-term solution. Most common diseases of our time include obesity, type 2 diabetes, hypertension, heart disease, and cancer. The Pritikin Program is recommended and has been proven to help reduce, reverse, and/or prevent the damaging effects of metabolic syndrome.  Nutrition   Overview of the Pritikin Eating Plan  Clinical staff conducted group or individual video education with verbal and written material and guidebook.  Patient learns about the Pritikin Eating Plan for disease risk reduction. The Pritikin Eating Plan emphasizes a wide variety of  unrefined, minimally-processed carbohydrates, like fruits, vegetables, whole grains, and legumes. Go, Caution, and Stop food choices are explained. Plant-based and lean animal proteins are emphasized. Rationale provided for low sodium intake for blood pressure control, low added sugars for blood sugar stabilization, and low added fats and oils for coronary artery disease risk reduction and weight management.  Calorie Density  Clinical staff conducted group or individual video education with verbal and written material and guidebook.  Patient learns about calorie density and how it impacts the Pritikin Eating Plan. Knowing the characteristics of the food you choose will help you decide whether those foods will lead to weight gain or weight loss, and whether you want to consume more or less of them. Weight loss is usually a side effect of the Pritikin Eating Plan because of its focus on low calorie-dense foods.  Label Reading  Clinical staff conducted group or individual video education with verbal and written material and guidebook.  Patient learns about the Pritikin recommended label reading guidelines and corresponding recommendations regarding calorie density, added sugars, sodium content, and whole grains.  Dining Out - Part 1  Clinical staff conducted group or individual video education with verbal and written material and guidebook.  Patient learns that restaurant meals can be sabotaging because they can be so high in calories, fat, sodium, and/or sugar. Patient learns recommended strategies on how to positively address this and avoid unhealthy pitfalls.  Facts on Fats  Clinical staff conducted group or individual video education with verbal and written material and guidebook.  Patient learns that lifestyle modifications can be just as effective, if not more so, as many medications for lowering your risk of heart disease. A Pritikin lifestyle can help to reduce your risk of inflammation and  atherosclerosis (cholesterol build-up, or plaque, in the artery walls). Lifestyle interventions such as dietary choices and physical activity address the cause of atherosclerosis. A review of the types of fats and their impact on blood cholesterol levels, along with dietary recommendations to reduce fat intake is also included.  Nutrition Action Plan  Clinical staff conducted group or individual video education with verbal and written material and guidebook.  Patient learns how to incorporate Pritikin recommendations into their lifestyle. Recommendations include planning and keeping personal health goals in mind as an important part of their success.  Healthy Mind-Set    Healthy Minds, Bodies, Hearts  Clinical staff conducted group or individual video education with verbal and written material and guidebook.  Patient learns how to identify when they are stressed. Video will  discuss the impact of that stress, as well as the many benefits of stress management. Patient will also be introduced to stress management techniques. The way we think, act, and feel has an impact on our hearts.  How Our Thoughts Can Heal Our Hearts  Clinical staff conducted group or individual video education with verbal and written material and guidebook.  Patient learns that negative thoughts can cause depression and anxiety. This can result in negative lifestyle behavior and serious health problems. Cognitive behavioral therapy is an effective method to help control our thoughts in order to change and improve our emotional outlook.  Additional Videos:  Exercise    Improving Performance  Clinical staff conducted group or individual video education with verbal and written material and guidebook.  Patient learns to use a non-linear approach by alternating intensity levels and lengths of time spent exercising to help burn more calories and lose more body fat. Cardiovascular exercise helps improve heart health, metabolism,  hormonal balance, blood sugar control, and recovery from fatigue. Resistance training improves strength, endurance, balance, coordination, reaction time, metabolism, and muscle mass. Flexibility exercise improves circulation, posture, and balance. Seek guidance from your physician and exercise physiologist before implementing an exercise routine and learn your capabilities and proper form for all exercise.  Introduction to Yoga  Clinical staff conducted group or individual video education with verbal and written material and guidebook.  Patient learns about yoga, a discipline of the coming together of mind, breath, and body. The benefits of yoga include improved flexibility, improved range of motion, better posture and core strength, increased lung function, weight loss, and positive self-image. Yoga's heart health benefits include lowered blood pressure, healthier heart rate, decreased cholesterol and triglyceride levels, improved immune function, and reduced stress. Seek guidance from your physician and exercise physiologist before implementing an exercise routine and learn your capabilities and proper form for all exercise.  Medical   Aging: Enhancing Your Quality of Life  Clinical staff conducted group or individual video education with verbal and written material and guidebook.  Patient learns key strategies and recommendations to stay in good physical health and enhance quality of life, such as prevention strategies, having an advocate, securing a Health Care Proxy and Power of Attorney, and keeping a list of medications and system for tracking them. It also discusses how to avoid risk for bone loss.  Biology of Weight Control  Clinical staff conducted group or individual video education with verbal and written material and guidebook.  Patient learns that weight gain occurs because we consume more calories than we burn (eating more, moving less). Even if your body weight is normal, you may have  higher ratios of fat compared to muscle mass. Too much body fat puts you at increased risk for cardiovascular disease, heart attack, stroke, type 2 diabetes, and obesity-related cancers. In addition to exercise, following the Pritikin Eating Plan can help reduce your risk.  Decoding Lab Results  Clinical staff conducted group or individual video education with verbal and written material and guidebook.  Patient learns that lab test reflects one measurement whose values change over time and are influenced by many factors, including medication, stress, sleep, exercise, food, hydration, pre-existing medical conditions, and more. It is recommended to use the knowledge from this video to become more involved with your lab results and evaluate your numbers to speak with your doctor.   Diseases of Our Time - Overview  Clinical staff conducted group or individual video education with verbal and written material and  guidebook.  Patient learns that according to the CDC, 50% to 70% of chronic diseases (such as obesity, type 2 diabetes, elevated lipids, hypertension, and heart disease) are avoidable through lifestyle improvements including healthier food choices, listening to satiety cues, and increased physical activity.  Sleep Disorders Clinical staff conducted group or individual video education with verbal and written material and guidebook.  Patient learns how good quality and duration of sleep are important to overall health and well-being. Patient also learns about sleep disorders and how they impact health along with recommendations to address them, including discussing with a physician.  Nutrition  Dining Out - Part 2 Clinical staff conducted group or individual video education with verbal and written material and guidebook.  Patient learns how to plan ahead and communicate in order to maximize their dining experience in a healthy and nutritious manner. Included are recommended food choices based on  the type of restaurant the patient is visiting.   Fueling a Banker conducted group or individual video education with verbal and written material and guidebook.  There is a strong connection between our food choices and our health. Diseases like obesity and type 2 diabetes are very prevalent and are in large-part due to lifestyle choices. The Pritikin Eating Plan provides plenty of food and hunger-curbing satisfaction. It is easy to follow, affordable, and helps reduce health risks.  Menu Workshop  Clinical staff conducted group or individual video education with verbal and written material and guidebook.  Patient learns that restaurant meals can sabotage health goals because they are often packed with calories, fat, sodium, and sugar. Recommendations include strategies to plan ahead and to communicate with the manager, chef, or server to help order a healthier meal.  Planning Your Eating Strategy  Clinical staff conducted group or individual video education with verbal and written material and guidebook.  Patient learns about the Pritikin Eating Plan and its benefit of reducing the risk of disease. The Pritikin Eating Plan does not focus on calories. Instead, it emphasizes high-quality, nutrient-rich foods. By knowing the characteristics of the foods, we choose, we can determine their calorie density and make informed decisions.  Targeting Your Nutrition Priorities  Clinical staff conducted group or individual video education with verbal and written material and guidebook.  Patient learns that lifestyle habits have a tremendous impact on disease risk and progression. This video provides eating and physical activity recommendations based on your personal health goals, such as reducing LDL cholesterol, losing weight, preventing or controlling type 2 diabetes, and reducing high blood pressure.  Vitamins and Minerals  Clinical staff conducted group or individual video education  with verbal and written material and guidebook.  Patient learns different ways to obtain key vitamins and minerals, including through a recommended healthy diet. It is important to discuss all supplements you take with your doctor.   Healthy Mind-Set    Smoking Cessation  Clinical staff conducted group or individual video education with verbal and written material and guidebook.  Patient learns that cigarette smoking and tobacco addiction pose a serious health risk which affects millions of people. Stopping smoking will significantly reduce the risk of heart disease, lung disease, and many forms of cancer. Recommended strategies for quitting are covered, including working with your doctor to develop a successful plan.  Culinary   Becoming a Set designer conducted group or individual video education with verbal and written material and guidebook.  Patient learns that cooking at home can be healthy, cost-effective,  quick, and puts them in control. Keys to cooking healthy recipes will include looking at your recipe, assessing your equipment needs, planning ahead, making it simple, choosing cost-effective seasonal ingredients, and limiting the use of added fats, salts, and sugars.  Cooking - Breakfast and Snacks  Clinical staff conducted group or individual video education with verbal and written material and guidebook.  Patient learns how important breakfast is to satiety and nutrition through the entire day. Recommendations include key foods to eat during breakfast to help stabilize blood sugar levels and to prevent overeating at meals later in the day. Planning ahead is also a key component.  Cooking - Educational psychologist conducted group or individual video education with verbal and written material and guidebook.  Patient learns eating strategies to improve overall health, including an approach to cook more at home. Recommendations include thinking of animal protein  as a side on your plate rather than center stage and focusing instead on lower calorie dense options like vegetables, fruits, whole grains, and plant-based proteins, such as beans. Making sauces in large quantities to freeze for later and leaving the skin on your vegetables are also recommended to maximize your experience.  Cooking - Healthy Salads and Dressing Clinical staff conducted group or individual video education with verbal and written material and guidebook.  Patient learns that vegetables, fruits, whole grains, and legumes are the foundations of the Pritikin Eating Plan. Recommendations include how to incorporate each of these in flavorful and healthy salads, and how to create homemade salad dressings. Proper handling of ingredients is also covered. Cooking - Soups and [employer] - Soups and Desserts Clinical staff conducted group or individual video education with verbal and written material and guidebook.  Patient learns that Pritikin soups and desserts make for easy, nutritious, and delicious snacks and meal components that are low in sodium, fat, sugar, and calorie density, while high in vitamins, minerals, and filling fiber. Recommendations include simple and healthy ideas for soups and desserts.   Overview     The Pritikin Solution Program Overview Clinical staff conducted group or individual video education with verbal and written material and guidebook.  Patient learns that the results of the Pritikin Program have been documented in more than 100 articles published in peer-reviewed journals, and the benefits include reducing risk factors for (and, in some cases, even reversing) high cholesterol, high blood pressure, type 2 diabetes, obesity, and more! An overview of the three key pillars of the Pritikin Program will be covered: eating well, doing regular exercise, and having a healthy mind-set.  WORKSHOPS  Exercise: Exercise Basics: Building Your Action Plan Clinical staff  led group instruction and group discussion with PowerPoint presentation and patient guidebook. To enhance the learning environment the use of posters, models and videos may be added. At the conclusion of this workshop, patients will comprehend the difference between physical activity and exercise, as well as the benefits of incorporating both, into their routine. Patients will understand the FITT (Frequency, Intensity, Time, and Type) principle and how to use it to build an exercise action plan. In addition, safety concerns and other considerations for exercise and cardiac rehab will be addressed by the presenter. The purpose of this lesson is to promote a comprehensive and effective weekly exercise routine in order to improve patients' overall level of fitness.   Managing Heart Disease: Your Path to a Healthier Heart Clinical staff led group instruction and group discussion with PowerPoint presentation and patient guidebook. To  enhance the learning environment the use of posters, models and videos may be added.At the conclusion of this workshop, patients will understand the anatomy and physiology of the heart. Additionally, they will understand how Pritikin's three pillars impact the risk factors, the progression, and the management of heart disease.  The purpose of this lesson is to provide a high-level overview of the heart, heart disease, and how the Pritikin lifestyle positively impacts risk factors.  Exercise Biomechanics Clinical staff led group instruction and group discussion with PowerPoint presentation and patient guidebook. To enhance the learning environment the use of posters, models and videos may be added. Patients will learn how the structural parts of their bodies function and how these functions impact their daily activities, movement, and exercise. Patients will learn how to promote a neutral spine, learn how to manage pain, and identify ways to improve their physical movement  in order to promote healthy living. The purpose of this lesson is to expose patients to common physical limitations that impact physical activity. Participants will learn practical ways to adapt and manage aches and pains, and to minimize their effect on regular exercise. Patients will learn how to maintain good posture while sitting, walking, and lifting.  Balance Training and Fall Prevention  Clinical staff led group instruction and group discussion with PowerPoint presentation and patient guidebook. To enhance the learning environment the use of posters, models and videos may be added. At the conclusion of this workshop, patients will understand the importance of their sensorimotor skills (vision, proprioception, and the vestibular system) in maintaining their ability to balance as they age. Patients will apply a variety of balancing exercises that are appropriate for their current level of function. Patients will understand the common causes for poor balance, possible solutions to these problems, and ways to modify their physical environment in order to minimize their fall risk. The purpose of this lesson is to teach patients about the importance of maintaining balance as they age and ways to minimize their risk of falling.  WORKSHOPS   Nutrition:  Fueling a Ship broker led group instruction and group discussion with PowerPoint presentation and patient guidebook. To enhance the learning environment the use of posters, models and videos may be added. Patients will review the foundational principles of the Pritikin Eating Plan and understand what constitutes a serving size in each of the food groups. Patients will also learn Pritikin-friendly foods that are better choices when away from home and review make-ahead meal and snack options. Calorie density will be reviewed and applied to three nutrition priorities: weight maintenance, weight loss, and weight gain. The purpose of this  lesson is to reinforce (in a group setting) the key concepts around what patients are recommended to eat and how to apply these guidelines when away from home by planning and selecting Pritikin-friendly options. Patients will understand how calorie density may be adjusted for different weight management goals.  Mindful Eating  Clinical staff led group instruction and group discussion with PowerPoint presentation and patient guidebook. To enhance the learning environment the use of posters, models and videos may be added. Patients will briefly review the concepts of the Pritikin Eating Plan and the importance of low-calorie dense foods. The concept of mindful eating will be introduced as well as the importance of paying attention to internal hunger signals. Triggers for non-hunger eating and techniques for dealing with triggers will be explored. The purpose of this lesson is to provide patients with the opportunity to review the basic  principles of the Pritikin Eating Plan, discuss the value of eating mindfully and how to measure internal cues of hunger and fullness using the Hunger Scale. Patients will also discuss reasons for non-hunger eating and learn strategies to use for controlling emotional eating.  Targeting Your Nutrition Priorities Clinical staff led group instruction and group discussion with PowerPoint presentation and patient guidebook. To enhance the learning environment the use of posters, models and videos may be added. Patients will learn how to determine their genetic susceptibility to disease by reviewing their family history. Patients will gain insight into the importance of diet as part of an overall healthy lifestyle in mitigating the impact of genetics and other environmental insults. The purpose of this lesson is to provide patients with the opportunity to assess their personal nutrition priorities by looking at their family history, their own health history and current risk factors.  Patients will also be able to discuss ways of prioritizing and modifying the Pritikin Eating Plan for their highest risk areas  Menu  Clinical staff led group instruction and group discussion with PowerPoint presentation and patient guidebook. To enhance the learning environment the use of posters, models and videos may be added. Using menus brought in from [employer], or printed from [employer], patients will apply the Pritikin dining out guidelines that were presented in the [employer] video. Patients will also be able to practice these guidelines in a variety of provided scenarios. The purpose of this lesson is to provide patients with the opportunity to practice hands-on learning of the Pritikin Dining Out guidelines with actual menus and practice scenarios.  Label Reading Clinical staff led group instruction and group discussion with PowerPoint presentation and patient guidebook. To enhance the learning environment the use of posters, models and videos may be added. Patients will review and discuss the Pritikin label reading guidelines presented in Pritikin's Label Reading Educational series video. Using fool labels brought in from local grocery stores and markets, patients will apply the label reading guidelines and determine if the packaged food meet the Pritikin guidelines. The purpose of this lesson is to provide patients with the opportunity to review, discuss, and practice hands-on learning of the Pritikin Label Reading guidelines with actual packaged food labels. Cooking School  Pritikin's [employer] are designed to teach patients ways to prepare quick, simple, and affordable recipes at home. The importance of nutrition's role in chronic disease risk reduction is reflected in its emphasis in the overall Pritikin program. By learning how to prepare essential core Pritikin Eating Plan recipes, patients will increase control over what they eat; be able to  customize the flavor of foods without the use of added salt, sugar, or fat; and improve the quality of the food they consume. By learning a set of core recipes which are easily assembled, quickly prepared, and affordable, patients are more likely to prepare more healthy foods at home. These workshops focus on convenient breakfasts, simple entres, side dishes, and desserts which can be prepared with minimal effort and are consistent with nutrition recommendations for cardiovascular risk reduction. Cooking [employer] are taught by a Armed forces logistics/support/administrative officer (RD) who has been trained by the [employer]. The chef or RD has a clear understanding of the importance of minimizing - if not completely eliminating - added fat, sugar, and sodium in recipes. Throughout the series of Cooking School Workshop sessions, patients will learn about healthy ingredients and efficient methods of cooking to build confidence in  their capability to prepare    Cooking School weekly topics:  Adding Flavor- Sodium-Free  Fast and Healthy Breakfasts  Powerhouse Plant-Based Proteins  Satisfying Salads and Dressings  Simple Sides and Sauces  International Cuisine-Spotlight on the Blue Zones  Delicious Desserts  Savory Soups  Efficiency Cooking - Meals in a Snap  Tasty Appetizers and Snacks  Comforting Weekend Breakfasts  One-Pot Wonders   Fast Evening Meals  Landscape architect Your Pritikin Plate  WORKSHOPS   Healthy Mindset (Psychosocial):  Focused Goals, Sustainable Changes Clinical staff led group instruction and group discussion with PowerPoint presentation and patient guidebook. To enhance the learning environment the use of posters, models and videos may be added. Patients will be able to apply effective goal setting strategies to establish at least one personal goal, and then take consistent, meaningful action toward that goal. They will learn to identify common barriers to  achieving personal goals and develop strategies to overcome them. Patients will also gain an understanding of how our mind-set can impact our ability to achieve goals and the importance of cultivating a positive and growth-oriented mind-set. The purpose of this lesson is to provide patients with a deeper understanding of how to set and achieve personal goals, as well as the tools and strategies needed to overcome common obstacles which may arise along the way.  From Head to Heart: The Power of a Healthy Outlook  Clinical staff led group instruction and group discussion with PowerPoint presentation and patient guidebook. To enhance the learning environment the use of posters, models and videos may be added. Patients will be able to recognize and describe the impact of emotions and mood on physical health. They will discover the importance of self-care and explore self-care practices which may work for them. Patients will also learn how to utilize the 4 C's to cultivate a healthier outlook and better manage stress and challenges. The purpose of this lesson is to demonstrate to patients how a healthy outlook is an essential part of maintaining good health, especially as they continue their cardiac rehab journey.  Healthy Sleep for a Healthy Heart Clinical staff led group instruction and group discussion with PowerPoint presentation and patient guidebook. To enhance the learning environment the use of posters, models and videos may be added. At the conclusion of this workshop, patients will be able to demonstrate knowledge of the importance of sleep to overall health, well-being, and quality of life. They will understand the symptoms of, and treatments for, common sleep disorders. Patients will also be able to identify daytime and nighttime behaviors which impact sleep, and they will be able to apply these tools to help manage sleep-related challenges. The purpose of this lesson is to provide patients with a  general overview of sleep and outline the importance of quality sleep. Patients will learn about a few of the most common sleep disorders. Patients will also be introduced to the concept of "sleep hygiene," and discover ways to self-manage certain sleeping problems through simple daily behavior changes. Finally, the workshop will motivate patients by clarifying the links between quality sleep and their goals of heart-healthy living.   Recognizing and Reducing Stress Clinical staff led group instruction and group discussion with PowerPoint presentation and patient guidebook. To enhance the learning environment the use of posters, models and videos may be added. At the conclusion of this workshop, patients will be able to understand the types of stress reactions, differentiate between acute and chronic stress, and recognize the impact that chronic  stress has on their health. They will also be able to apply different coping mechanisms, such as reframing negative self-talk. Patients will have the opportunity to practice a variety of stress management techniques, such as deep abdominal breathing, progressive muscle relaxation, and/or guided imagery.  The purpose of this lesson is to educate patients on the role of stress in their lives and to provide healthy techniques for coping with it.  Learning Barriers/Preferences:  Learning Barriers/Preferences - 12/07/22 1536       Learning Barriers/Preferences   Learning Barriers Sight   wears glasses   Learning Preferences Audio;Computer/Internet;Group Instruction;Individual Instruction;Pictoral;Skilled Demonstration;Verbal Instruction;Video;Written Material             Education Topics:  Knowledge Questionnaire Score:  Knowledge Questionnaire Score - 12/07/22 1536       Knowledge Questionnaire Score   Pre Score 21/23             Core Components/Risk Factors/Patient Goals at Admission:  Personal Goals and Risk Factors at Admission - 12/07/22  1538       Core Components/Risk Factors/Patient Goals on Admission   Hypertension Yes    Intervention Provide education on lifestyle modifcations including regular physical activity/exercise, weight management, moderate sodium restriction and increased consumption of fresh fruit, vegetables, and low fat dairy, alcohol moderation, and smoking cessation.;Monitor prescription use compliance.    Expected Outcomes Short Term: Continued assessment and intervention until BP is < 140/37mm HG in hypertensive participants. < 130/12mm HG in hypertensive participants with diabetes, heart failure or chronic kidney disease.;Long Term: Maintenance of blood pressure at goal levels.    Lipids Yes    Intervention Provide education and support for participant on nutrition & aerobic/resistive exercise along with prescribed medications to achieve LDL 70mg , HDL >40mg .    Expected Outcomes Short Term: Participant states understanding of desired cholesterol values and is compliant with medications prescribed. Participant is following exercise prescription and nutrition guidelines.;Long Term: Cholesterol controlled with medications as prescribed, with individualized exercise RX and with personalized nutrition plan. Value goals: LDL < 70mg , HDL > 40 mg.    Stress Yes    Intervention Offer individual and/or small group education and counseling on adjustment to heart disease, stress management and health-related lifestyle change. Teach and support self-help strategies.;Refer participants experiencing significant psychosocial distress to appropriate mental health specialists for further evaluation and treatment. When possible, include family members and significant others in education/counseling sessions.    Expected Outcomes Short Term: Participant demonstrates changes in health-related behavior, relaxation and other stress management skills, ability to obtain effective social support, and compliance with psychotropic medications  if prescribed.;Long Term: Emotional wellbeing is indicated by absence of clinically significant psychosocial distress or social isolation.             Core Components/Risk Factors/Patient Goals Review:    Core Components/Risk Factors/Patient Goals at Discharge (Final Review):    ITP Comments:  ITP Comments     Row Name 12/07/22 1519           ITP Comments Armanda Magic, MD: Medical Director.  Introduction to the [facility] / Intensive Cardiac Rehab.  Initial orientation packet reviewed with the patient.                Comments: Participant attended orientation for the cardiac rehabilitation program on  12/07/2022  to perform initial intake and exercise walk test. Patient introduced to the Pritikin Program education and orientation packet was reviewed. Completed 6-minute walk test, measurements, initial ITP, and exercise prescription. Vital  signs stable. Telemetry-sinus bradycardia/normal sinus rhythm, asymptomatic.   Service time was from 1309 to 1530.

## 2022-12-07 NOTE — Telephone Encounter (Signed)
Reviewed with patient the Cardiac Rehab Cardiac Risk [facility] Nursing Assessment. Patient knows office location, and to wear comfortable clothing/closed-toed shoes. Recommended to patient to eat, take medications, and if diabetic, to check blood sugar before coming to classes. Explained orientation may take 2 hours. Stated medication can cause her to have dizziness.

## 2022-12-07 NOTE — Progress Notes (Signed)
Cardiac Rehab Medication Review   Does the patient  feel that his/her medications are working for him/her?  yes  Has the patient been experiencing any side effects to the medications prescribed?  yes  Does the patient measure his/her own blood pressure or blood glucose at home?  yes   Does the patient have any problems obtaining medications due to transportation or finances?   no  Understanding of regimen: excellent Understanding of indications: excellent Potential of compliance: excellent    Comments: Pt checks blood pressure 1x/week. Pt feels that some of the medications make her sleepy and she voices she has low energy.     [name] 12/07/2022 1:29 PM

## 2022-12-08 DIAGNOSIS — I25118 Atherosclerotic heart disease of native coronary artery with other forms of angina pectoris: Secondary | ICD-10-CM | POA: Diagnosis not present

## 2022-12-08 DIAGNOSIS — F4323 Adjustment disorder with mixed anxiety and depressed mood: Secondary | ICD-10-CM | POA: Diagnosis not present

## 2022-12-08 DIAGNOSIS — I10 Essential (primary) hypertension: Secondary | ICD-10-CM | POA: Diagnosis not present

## 2022-12-08 DIAGNOSIS — K219 Gastro-esophageal reflux disease without esophagitis: Secondary | ICD-10-CM | POA: Diagnosis not present

## 2022-12-08 DIAGNOSIS — Z8673 Personal history of transient ischemic attack (TIA), and cerebral infarction without residual deficits: Secondary | ICD-10-CM | POA: Diagnosis not present

## 2022-12-08 DIAGNOSIS — M1990 Unspecified osteoarthritis, unspecified site: Secondary | ICD-10-CM | POA: Diagnosis not present

## 2022-12-08 DIAGNOSIS — E039 Hypothyroidism, unspecified: Secondary | ICD-10-CM | POA: Diagnosis not present

## 2022-12-08 DIAGNOSIS — I209 Angina pectoris, unspecified: Secondary | ICD-10-CM | POA: Diagnosis not present

## 2022-12-13 ENCOUNTER — Encounter (HOSPITAL_COMMUNITY)
Admission: RE | Admit: 2022-12-13 | Discharge: 2022-12-13 | Disposition: A | Discharge status: Home / Self Care | Payer: Medicare PPO | Source: Ambulatory Visit | Attending: Cardiovascular Disease | Admitting: Cardiovascular Disease

## 2022-12-13 DIAGNOSIS — Z955 Presence of coronary angioplasty implant and graft: Secondary | ICD-10-CM

## 2022-12-13 NOTE — Progress Notes (Signed)
Daily Session Note  Patient Details  Name: Tiffany Mayo MRN: [id number] Date of Birth: [birth year] Referring Provider:   Flowsheet Row INTENSIVE CARDIAC REHAB ORIENT from 12/07/2022 in [facility]  Referring Provider Lennie Odor, [place]       Encounter Date: 12/13/2022  Check In:  Session Check In - 12/13/22 1505       Check-In   Supervising physician immediately available to respond to emergencies [facility] MD immediately available    Physician(s) Edd Fabian, NP    Location [facility]    Staff Present [name], MS, ACSM-CEP, CCRP, Exercise Physiologist;Elazar Argabright, RN, Cathlean Cower, MS, Exercise Physiologist;Olinty Peggye Pitt, MS, ACSM-CEP, Exercise Physiologist;Jetta Walker BS, ACSM-CEP, Exercise Physiologist    Virtual Visit No    Medication changes reported     No    Fall or balance concerns reported    No    Tobacco Cessation No Change    Warm-up and Cool-down Performed as group-led instruction    Resistance Training Performed Yes    VAD Patient? No    PAD/SET Patient? No      Pain Assessment   Currently in Pain? No/denies    Pain Score 0-No pain    Multiple Pain Sites No             Capillary Blood Glucose: No results found for this or any previous visit (from the past 24 hour(s)).   Exercise Prescription Changes - 12/13/22 1626       Response to Exercise   Blood Pressure (Admit) 108/62    Blood Pressure (Exercise) 142/76    Blood Pressure (Exit) 98/62    Heart Rate (Admit) 66 bpm    Heart Rate (Exercise) 82 bpm    Heart Rate (Exit) 65 bpm    Rating of Perceived Exertion (Exercise) 11    Perceived Dyspnea (Exercise) 0    Symptoms low bp post, asymptomatic    Comments Pt first day in teh CRP2 program    Duration Progress to 30 minutes of  aerobic without signs/symptoms of physical distress    Intensity THRR unchanged      Progression   Progression Continue to progress  workloads to maintain intensity without signs/symptoms of physical distress.    Average METs 2.2      Resistance Training   Training Prescription Yes    Weight 3 lbs    Reps 10-15    Time 10 Minutes      Recumbant Bike   Level 1    RPM 15    Watts 66    Minutes 15    METs 2.1      NuStep   Level 2    SPM 74    Minutes 15    METs 2.6             Social History   Tobacco Use  Smoking Status Never  Smokeless Tobacco Never    Goals Met:  Exercise tolerated well No report of concerns or symptoms today Strength training completed today  Goals Unmet:  Not Applicable  Comments: Pt started cardiac rehab today.  Pt tolerated light exercise without difficulty. VSS, telemetry-Sinus rhythm, asymptomatic.  Medication list reconciled. Pt denies barriers to medicaiton compliance.  PSYCHOSOCIAL ASSESSMENT:  PHQ-6. Pt exhibits positive coping skills, hopeful outlook with supportive family. Tasneem's husband has early onset dementia. They are looking into possibly moving to a retirement community in the futrue  Pt enjoys working with [employer], gardening and painting.   Pt oriented to exercise equipment and routine.    Understanding verbalized.    Dr. Armanda Magic is Medical Director for Cardiac Rehab at [facility].

## 2022-12-15 ENCOUNTER — Encounter (HOSPITAL_COMMUNITY): Payer: Medicare PPO

## 2022-12-15 DIAGNOSIS — Z955 Presence of coronary angioplasty implant and graft: Secondary | ICD-10-CM | POA: Diagnosis not present

## 2022-12-17 ENCOUNTER — Encounter (HOSPITAL_COMMUNITY): Payer: Medicare PPO

## 2022-12-17 ENCOUNTER — Telehealth (HOSPITAL_COMMUNITY): Payer: Self-pay

## 2022-12-17 NOTE — Telephone Encounter (Signed)
Called pt to inform of no CRP2 due to telemetry downtime. Pt voiced understanding.   Jonna Coup, MS, ACSM-CEP 12/17/2022 9:51 AM

## 2022-12-20 ENCOUNTER — Encounter (HOSPITAL_COMMUNITY)
Admission: RE | Admit: 2022-12-20 | Discharge: 2022-12-20 | Disposition: A | Discharge status: Home / Self Care | Payer: Medicare PPO | Source: Ambulatory Visit | Attending: Cardiovascular Disease | Admitting: Cardiovascular Disease

## 2022-12-20 DIAGNOSIS — Z955 Presence of coronary angioplasty implant and graft: Secondary | ICD-10-CM

## 2022-12-20 NOTE — Progress Notes (Addendum)
Tiffany Mayo reports that she felt lightheaded yesterday morning and felt lightheaded some this morning. Tiffany Mayo reports that she started having a  headache a few days ago. Denies having chest pain today but did report experiencing some chest pressure last night lasted 5 minutes. Tiffany Mayo reported experiencing some indigestion that past 2 nights after eating dinner. Denies having indigestion today. Telemetry rhythm Sinus rate 71. Oxygen saturation 97% on room air. Sitting blood pressure 124/60. Standing blood pressure 104/70.  Medications reviewed. Taking as prescribed. Tiffany Mayo says that her blood pressure yesterday was 70/50 than 80/50 at home yesterday. Onsite provider Tiffany Reining DNP notified. Tiffany Mayo did not exercise today.   Per Tiffany Reining DNP I would stop the isosorbide and see how she does. Looks like she has some GERD issues and is on a PPI. If the GERD symptoms continue after eating should see PCP for adjustments. If symptoms are worse after stopping her isosorbide should be seen in the office.   recheck bp 130/68 sitting. Standing BP 132/64  46 mins okay I will tell her to stop the Imdur. she will call the office if her symptoms continue. Okay to resume exercise on Wednesday?  thank you!  44 mins Tiffany Gross, NP That's better. She takes both the isosorbide and the olmesartan in the morning according to her note. She might need to take one and night and the other in the evening to avoid a cumulative effect.   Would still hold the isosorbide for now.   43 mins KL I think she can resume exercise on Wed if she feels okay.   Patient aware of medication change and left cardiac rehab without complaints or symptoms. Patient told she may return to exercise on Wednesday providing that she is feeling better. Patient instructed to make sure she is drinking enough water. Tiffany Mayo bought her home blood pressure cuff for comparison. Tiffany Mayo says her blood pressure cuff is more than 82 years old. I  advised Tiffany Mayo to get a new BP cuff. Tiffany Mayo may want to check with her insurance provider they may provide one to her at no cost.Tiffany Mayo Tiffany Palmer RN BSN

## 2022-12-22 ENCOUNTER — Encounter (HOSPITAL_COMMUNITY)
Admission: RE | Admit: 2022-12-22 | Discharge: 2022-12-22 | Disposition: A | Discharge status: Home / Self Care | Payer: Medicare PPO | Source: Ambulatory Visit | Attending: Cardiovascular Disease | Admitting: Cardiovascular Disease

## 2022-12-22 DIAGNOSIS — Z955 Presence of coronary angioplasty implant and graft: Secondary | ICD-10-CM | POA: Diagnosis not present

## 2022-12-24 ENCOUNTER — Encounter (HOSPITAL_COMMUNITY)
Admission: RE | Admit: 2022-12-24 | Discharge: 2022-12-24 | Disposition: A | Discharge status: Home / Self Care | Payer: Medicare PPO | Source: Ambulatory Visit | Attending: Cardiovascular Disease | Admitting: Cardiovascular Disease

## 2022-12-24 DIAGNOSIS — Z955 Presence of coronary angioplasty implant and graft: Secondary | ICD-10-CM

## 2022-12-27 ENCOUNTER — Encounter (HOSPITAL_COMMUNITY)
Admission: RE | Admit: 2022-12-27 | Discharge: 2022-12-27 | Disposition: A | Discharge status: Home / Self Care | Payer: Medicare PPO | Source: Ambulatory Visit | Attending: Cardiovascular Disease | Admitting: Cardiovascular Disease

## 2022-12-27 DIAGNOSIS — Z955 Presence of coronary angioplasty implant and graft: Secondary | ICD-10-CM

## 2022-12-29 ENCOUNTER — Encounter (HOSPITAL_COMMUNITY)
Admission: RE | Admit: 2022-12-29 | Discharge: 2022-12-29 | Disposition: A | Discharge status: Home / Self Care | Payer: Medicare PPO | Source: Ambulatory Visit | Attending: Cardiovascular Disease | Admitting: Cardiovascular Disease

## 2022-12-29 DIAGNOSIS — Z955 Presence of coronary angioplasty implant and graft: Secondary | ICD-10-CM

## 2022-12-29 NOTE — Progress Notes (Signed)
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 Swallow whole., Disp: 30 tablet, Rfl: 11   atorvastatin (LIPITOR) 80 MG tablet, Take 80 mg by mouth in the morning., Disp: , Rfl:    clopidogrel (PLAVIX) 75 MG tablet, Take 1 tablet (75 mg total) by mouth daily., Disp: 30 tablet, Rfl: 11   cycloSPORINE (RESTASIS) 0.05 % ophthalmic emulsion, Place 1 drop into both eyes 2 (two) times daily., Disp: , Rfl:    escitalopram (LEXAPRO) 5 MG tablet, Take 5 mg by mouth in the morning., Disp: , Rfl:    isosorbide mononitrate (IMDUR) 30 MG 24 hr tablet, Take 0.5 tablets (15 mg total) by mouth daily. (Patient not taking: Reported on 12/20/2022), Disp: 45 tablet, Rfl: 3   levETIRAcetam (KEPPRA XR) 500 MG 24 hr tablet, Take 1 tablet (500 mg total) by mouth daily., Disp: 30 tablet, Rfl: 5   levothyroxine (SYNTHROID) 50 MCG tablet, Take 50 mcg by mouth every other day., Disp: , Rfl:    levothyroxine (SYNTHROID) 75 MCG tablet, Take 75 mcg by mouth every other day., Disp: , Rfl:    Multiple Vitamins-Minerals (AIRBORNE PO), Take 3 tablets by mouth daily., Disp: , Rfl:    nitroGLYCERIN (NITROSTAT) 0.4 MG SL tablet, Place 1  tablet (0.4 mg total) under the tongue every 5 (five) minutes as needed for chest pain. (Patient not taking: Reported on 11/24/2022), Disp: 90 tablet, Rfl: 3   olmesartan (BENICAR) 20 MG tablet, Take 20 mg by mouth in the morning., Disp: , Rfl:    pantoprazole (PROTONIX) 40 MG tablet, Take 1 tablet (40 mg total) by mouth in the morning., Disp: 90 tablet, Rfl: 3  Past Medical History: Past Medical History:  Diagnosis Date   Arthritis    spine, neck   Coronary artery disease    GERD (gastroesophageal reflux disease)    Headache    SINUS    Hemorrhoid    Hyperlipemia    Hypertension    Hypothyroidism    Seizures (HCC)     Tobacco Use: Social History   Tobacco Use  Smoking Status Never  Smokeless Tobacco Never    Labs: Review Flowsheet       Latest Ref Rng & Units 03/05/2016 08/04/2016 04/08/2022 11/17/2022  Labs for ITP Cardiac and Pulmonary Rehab  Cholestrol 100 - 199 mg/dL - - 409  811   LDL (calc) 0 - 99 mg/dL - - 914  62   HDL-C >78 mg/dL - - 44  45   Trlycerides 0 - 149 mg/dL - - 295  93   Hemoglobin A1c 4.8 - 5.6 % - - 5.0  -  Bicarbonate 20.0 - 28.0 mmol/L - 24.8  - -  TCO2 0 - 100 mmol/L 28  26  - -  O2 Saturation % - 96.0  - -    Details            Capillary Blood Glucose: Lab Results  Component Value Date   GLUCAP 94 03/05/2016     Exercise Target Goals: Exercise Program Goal: Individual exercise prescription set using results from initial 6 min walk test and THRR while considering  patient's activity barriers and safety.   Exercise Prescription Goal: Initial exercise prescription builds to 30-45 minutes a day of aerobic activity, 2-3 days per week.  Home exercise guidelines will be given to patient during program as part of exercise prescription that the participant will acknowledge.  Activity Barriers & Risk Stratification:  Activity Barriers & Cardiac Risk Stratification - 12/07/22 1538       Activity Barriers & Cardiac Risk Stratification    Activity Barriers Balance Concerns;Joint Problems;History of Falls;Neck/Spine Problems;Other (comment);Back Problems;Arthritis    Comments ? h/o seizures vs TIA    Cardiac Risk Stratification High             6 Minute Walk:  6 Minute Walk     Row Name 12/07/22 1443         6 Minute Walk   Phase Initial     Distance 1522 feet     Walk Time 6 minutes     # of Rest Breaks 0     MPH 2.9     METS 2.6     RPE 9     Perceived Dyspnea  0     VO2 Peak 9     Symptoms Yes (comment)     Comments Posterior bilateral neck pain 6/10     Resting HR 53 bpm     Resting BP 112/68     Resting Oxygen Saturation  97 %     Exercise Oxygen Saturation  during 6 min walk 97 %     Max Ex. HR 84 bpm     Max Ex. BP 140/70     2 Minute Post BP 124/68              Oxygen Initial Assessment:   Oxygen Re-Evaluation:   Oxygen Discharge (Final Oxygen Re-Evaluation):   Initial Exercise Prescription:  Initial Exercise Prescription - 12/07/22 1500       Date of Initial Exercise RX and Referring Provider   Date 12/07/22    Referring Provider Lennie Odor, Md    Expected Discharge Date 03/02/23      Recumbant Bike   Level 1    RPM 12    Watts 20    Minutes 15    METs 2.6      NuStep   Level 2    SPM 75    Minutes 15    METs 2.6      Prescription Details   Frequency (times per week) 3    Duration Progress to 30 minutes of continuous aerobic without signs/symptoms of physical distress      Intensity   THRR 40-80% of Max Heartrate 55-110    Ratings of Perceived Exertion 11-13    Perceived Dyspnea 0-4      Progression   Progression Continue progressive overload as per policy without signs/symptoms or physical distress.      Resistance Training   Training Prescription Yes    Weight 3 lbs    Reps 10-15  Perform Capillary Blood Glucose checks as needed.  Exercise Prescription Changes:   Exercise Prescription Changes     Row Name 12/13/22 1626              Response to Exercise   Blood Pressure (Admit) 108/62       Blood Pressure (Exercise) 142/76       Blood Pressure (Exit) 98/62       Heart Rate (Admit) 66 bpm       Heart Rate (Exercise) 82 bpm       Heart Rate (Exit) 65 bpm       Rating of Perceived Exertion (Exercise) 11       Perceived Dyspnea (Exercise) 0       Symptoms low bp post, asymptomatic       Comments Pt first day in teh CRP2 program       Duration Progress to 30 minutes of  aerobic without signs/symptoms of physical distress       Intensity THRR unchanged         Progression   Progression Continue to progress workloads to maintain intensity without signs/symptoms of physical distress.       Average METs 2.2         Resistance Training   Training Prescription Yes       Weight 3 lbs       Reps 10-15       Time 10 Minutes         Recumbant Bike   Level 1       RPM 15       Watts 66       Minutes 15       METs 2.1         NuStep   Level 2       SPM 74       Minutes 15       METs 2.6                Exercise Comments:   Exercise Comments     Row Name 12/13/22 1644           Exercise Comments Pt first day in the [facility] ICR program. Pt tolerated exercise well with an average MET level of 2.2. Pt is learning her THRR, RPE and ExRX and is off to a great start.                Exercise Goals and Review:   Exercise Goals     Row Name 12/07/22 1521             Exercise Goals   Increase Physical Activity Yes       Intervention Provide advice, education, support and counseling about physical activity/exercise needs.;Develop an individualized exercise prescription for aerobic and resistive training based on initial evaluation findings, risk stratification, comorbidities and participant's personal goals.       Expected Outcomes Short Term: Attend rehab on a regular basis to increase amount of physical activity.;Long Term: Exercising regularly at least 3-5 days a week.;Long Term: Add in  home exercise to make exercise part of routine and to increase amount of physical activity.       Increase Strength and Stamina Yes       Intervention Provide advice, education, support and counseling about physical activity/exercise needs.;Develop an individualized exercise prescription for aerobic and resistive training based on initial evaluation findings, risk stratification, comorbidities and participant's personal goals.  Expected Outcomes Short Term: Increase workloads from initial exercise prescription for resistance, speed, and METs.;Short Term: Perform resistance training exercises routinely during rehab and add in resistance training at home;Long Term: Improve cardiorespiratory fitness, muscular endurance and strength as measured by increased METs and functional capacity ( )       Able to understand and use rate of perceived exertion (RPE) scale Yes       Intervention Provide education and explanation on how to use RPE scale       Expected Outcomes Short Term: Able to use RPE daily in rehab to express subjective intensity level;Long Term:  Able to use RPE to guide intensity level when exercising independently       Knowledge and understanding of Target Heart Rate Range (THRR) Yes       Intervention Provide education and explanation of THRR including how the numbers were predicted and where they are located for reference       Expected Outcomes Short Term: Able to state/look up THRR;Long Term: Able to use THRR to govern intensity when exercising independently;Short Term: Able to use daily as guideline for intensity in rehab       Understanding of Exercise Prescription Yes       Intervention Provide education, explanation, and written materials on patient's individual exercise prescription       Expected Outcomes Short Term: Able to explain program exercise prescription;Long Term: Able to explain home exercise prescription to exercise independently                Exercise Goals  Re-Evaluation :  Exercise Goals Re-Evaluation     Row Name 12/13/22 1631             Exercise Goal Re-Evaluation   Exercise Goals Review Increase Physical Activity;Understanding of Exercise Prescription;Increase Strength and Stamina;Knowledge and understanding of Target Heart Rate Range (THRR);Able to understand and use rate of perceived exertion (RPE) scale       Comments Pt first day in the [facility] ICR program. Pt tolerated exercise well with an average MET level of 2.2. Pt is learning her THRR, RPE and ExRX and is off to a great start.       Expected Outcomes Will continue to monitor pt and progress workloads as tolerated without sign or symptom                Discharge Exercise Prescription (Final Exercise Prescription Changes):  Exercise Prescription Changes - 12/13/22 1626       Response to Exercise   Blood Pressure (Admit) 108/62    Blood Pressure (Exercise) 142/76    Blood Pressure (Exit) 98/62    Heart Rate (Admit) 66 bpm    Heart Rate (Exercise) 82 bpm    Heart Rate (Exit) 65 bpm    Rating of Perceived Exertion (Exercise) 11    Perceived Dyspnea (Exercise) 0    Symptoms low bp post, asymptomatic    Comments Pt first day in teh CRP2 program    Duration Progress to 30 minutes of  aerobic without signs/symptoms of physical distress    Intensity THRR unchanged      Progression   Progression Continue to progress workloads to maintain intensity without signs/symptoms of physical distress.    Average METs 2.2      Resistance Training   Training Prescription Yes    Weight 3 lbs    Reps 10-15    Time 10 Minutes      Recumbant Bike   Level 1  RPM 15    Watts 66    Minutes 15    METs 2.1      NuStep   Level 2    SPM 74    Minutes 15    METs 2.6             Nutrition:  Target Goals: Understanding of nutrition guidelines, daily intake of sodium 1500mg , cholesterol 200mg , calories 30% from fat and 7% or less from saturated fats, daily to have 5 or  more servings of fruits and vegetables.  Biometrics:  Pre Biometrics - 12/07/22 1350       Pre Biometrics   Waist Circumference 33 inches    Hip Circumference 40.25 inches    Waist to Hip Ratio 0.82 %    Triceps Skinfold 26 mm    % Body Fat 37.9 %    Grip Strength 18 kg    Flexibility 17 in    Single Leg Stand 6.8 seconds              Nutrition Therapy Plan and Nutrition Goals:  Nutrition Therapy & Goals - 12/13/22 1548       Nutrition Therapy   Diet Heart healthy diet    Drug/Food Interactions Statins/Certain Fruits      Personal Nutrition Goals   Nutrition Goal Patient to identify strategies for reducing cardiovascular risk by attending the [facility] education and nutrition series weekly.    Personal Goal #2 Patient to improve diet quality by using the plate method as a guide for meal planning to include lean protein/plant protein, fruits, vegetables, whole grains, nonfat dairy as part of a well-balanced diet.    Personal Goal #3 Patient to reduce sodium intake to 1500mg  per day    Comments Tiffany Mayo reports following a low sugar diet with close attention to saturated fat and sodium intake. She denies nutrition questions/concerns at this time. 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      Intervention Plan   Intervention Prescribe, educate and counsel regarding individualized specific dietary modifications aiming towards targeted core components such as weight, hypertension, lipid management, diabetes, heart failure and other comorbidities.;Nutrition handout(s) given to patient.    Expected Outcomes Short Term Goal: Understand basic principles of dietary content, such as calories, fat, sodium, cholesterol and nutrients.;Long Term Goal: Adherence to prescribed nutrition plan.             Nutrition Assessments:  Nutrition Assessments - 12/16/22  1200       Rate Your Plate Scores   Pre Score 68            MEDIFICTS Score Key: ?70 Need to make dietary changes  40-70 Heart Healthy Diet ? 40 Therapeutic Level Cholesterol Diet   Flowsheet Row INTENSIVE CARDIAC REHAB from 12/15/2022 in [facility]  Picture Your Plate Total Score on Admission 68      Picture Your Plate Scores: <78 Unhealthy dietary pattern with much room for improvement. 41-50 Dietary pattern unlikely to meet recommendations for good health and room for improvement. 51-60 More healthful dietary pattern, with some room for improvement.  >60 Healthy dietary pattern, although there may be some specific behaviors that could be improved.    Nutrition Goals Re-Evaluation:  Nutrition Goals Re-Evaluation     Row Name 12/13/22 [phone]  Goals   Current Weight 141 lb 15.6 oz (64.4 kg)       Comment A1c WNL, lipids WNL       Expected Outcome Winefred reports following a low sugar diet with close attention to saturated fat and sodium intake. She denies nutrition questions/concerns at this time. 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                Nutrition Goals Re-Evaluation:  Nutrition Goals Re-Evaluation     Row Name 12/13/22 1548             Goals   Current Weight 141 lb 15.6 oz (64.4 kg)       Comment A1c WNL, lipids WNL       Expected Outcome Tiffany Mayo reports following a low sugar diet with close attention to saturated fat and sodium intake. She denies nutrition questions/concerns at this time. 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                Nutrition Goals Discharge (Final Nutrition Goals Re-Evaluation):  Nutrition  Goals Re-Evaluation - 12/13/22 1548       Goals   Current Weight 141 lb 15.6 oz (64.4 kg)    Comment A1c WNL, lipids WNL    Expected Outcome Tiffany Mayo reports following a low sugar diet with close attention to saturated fat and sodium intake. She denies nutrition questions/concerns at this time. 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             Psychosocial: Target Goals: Acknowledge presence or absence of significant depression and/or stress, maximize coping skills, provide positive support system. Participant is able to verbalize types and ability to use techniques and skills needed for reducing stress and depression.  Initial Review & Psychosocial Screening:  Initial Psych Review & Screening - 12/07/22 1534       Initial Review   Current issues with Current Sleep Concerns;Current Stress Concerns    Source of Stress Concerns Family    Comments Pt recently diagnosed with Dementia      Family Dynamics   Good Support System? Yes   Pt has son/daughter-in-law, sister, neighbors, church family     Barriers   Psychosocial barriers to participate in program The patient should benefit from training in stress management and relaxation.      Screening Interventions   Interventions Encouraged to exercise             Quality of Life Scores:  Quality of Life - 12/07/22 1550       Quality of Life   Select Quality of Life      Quality of Life Scores   Health/Function Pre 25.47 %    Socioeconomic Pre 26.92 %    Psych/Spiritual Pre 27.43 %    Family Pre 28.8 %    GLOBAL Pre 26.65 %            Scores of 19 and below usually indicate a poorer quality of life in these areas.  A difference of  2-3 points is a clinically meaningful difference.  A difference of 2-3 points in the total score of the Quality of Life Index has been associated with significant  improvement in overall  quality of life, self-image, physical symptoms, and general health in studies assessing change in quality of life.  PHQ-9: Review Flowsheet       12/07/2022  Depression screen PHQ 2/9  Decreased Interest 1  Down, Depressed, Hopeless 0  PHQ - 2 Score 1  Altered sleeping 2  Tired, decreased energy 2  Change in appetite 1  Feeling bad or failure about yourself  0  Trouble concentrating 0  Moving slowly or fidgety/restless 0  Suicidal thoughts 0  PHQ-9 Score 6  Difficult doing work/chores Not difficult at all    Details           Interpretation of Total Score  Total Score Depression Severity:  1-4 = Minimal depression, 5-9 = Mild depression, 10-14 = Moderate depression, 15-19 = Moderately severe depression, 20-27 = Severe depression   Psychosocial Evaluation and Intervention:   Psychosocial Re-Evaluation:  Psychosocial Re-Evaluation     Row Name 12/14/22 0915 12/29/22 1457           Psychosocial Re-Evaluation   Current issues with Current Stress Concerns Current Stress Concerns      Comments Husband has early stages of dementia. looking at options for future thinking about moving to a retirment community. 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 Tiffany Mayo has not voiced any increased concerns or stressors during exercise at cardiac rehab      Expected Outcomes Tiffany Mayo will have decreased or controlled stress upon completion of cardiac rehab Tiffany Mayo will have decreased or controlled stress upon completion of cardiac rehab      Interventions Stress management education;Relaxation education;Encouraged to attend Cardiac Rehabilitation for the exercise Stress management education;Relaxation education;Encouraged to attend Cardiac Rehabilitation for the exercise      Continue Psychosocial Services  Follow up required by staff Follow up required by staff        Initial Review   Source of Stress Concerns Family Family      Comments  Will continue to monitor and offer support as needed Will continue to monitor and offer support as needed               Psychosocial Discharge (Final Psychosocial Re-Evaluation):  Psychosocial Re-Evaluation - 12/29/22 1457       Psychosocial Re-Evaluation   Current issues with Current Stress Concerns    Comments Husband has early stages of dementia. Karalynn has not voiced any increased concerns or stressors during exercise at cardiac rehab    Expected Outcomes Tiffany Mayo will have decreased or controlled stress upon completion of cardiac rehab    Interventions Stress management education;Relaxation education;Encouraged to attend Cardiac Rehabilitation for the exercise    Continue Psychosocial Services  Follow up required by staff      Initial Review   Source of Stress Concerns Family    Comments Will continue to monitor and offer support as needed             Vocational Rehabilitation: Provide vocational rehab assistance to qualifying candidates.   Vocational Rehab Evaluation & Intervention:  Vocational Rehab - 12/07/22 1537       Initial Vocational Rehab Evaluation & Intervention   Assessment shows need for Vocational Rehabilitation No   Pt is a retired Engineer, site            Education: Education Goals: Education classes will be provided on a weekly basis, covering required topics. Participant will state understanding/return demonstration of topics presented.    Education  Row Name 12/13/22 1600     Education   Cardiac Education Topics Pritikin   Psychologist, forensic Exercise Education   Exercise Education Biomechanial Limitations   Instruction Review Code 1- Verbalizes Understanding   Class Start Time 1406   Class Stop Time 1449   Class Time Calculation (min) 43 min    Row Name 12/15/22 1600     Education   Cardiac Education Topics [facility]in   Cabin crew   Weekly Topic Comforting Weekend Breakfasts   Instruction Review Code 1- Verbalizes Understanding   Class Start Time 1400   Class Stop Time 1445   Class Time Calculation (min) 45 min    Row Name 12/20/22 1500     Education   Cardiac Education Topics [facility]in   Licensed conveyancer Nutrition   Nutrition Facts on Fat   Instruction Review Code 1- Verbalizes Understanding   Class Start Time 1400   Class Stop Time 1440   Class Time Calculation (min) 40 min    Row Name 12/22/22 1500     Education   Cardiac Education Topics Pritikin   Customer service manager   Weekly Topic Fast Evening Meals   Instruction Review Code 1- Verbalizes Understanding   Class Start Time 1400   Class Stop Time 1440   Class Time Calculation (min) 40 min    Row Name 12/24/22 1500     Education   Cardiac Education Topics Pritikin     Core Videos   Educator Dietitian   Select Nutrition   Nutrition Vitamins and Minerals   Instruction Review Code 1- Verbalizes Understanding   Class Start Time 1400   Class Stop Time 1445   Class Time Calculation (min) 45 min    Row Name 12/27/22 1600     Education   Cardiac Education Topics Pritikin   Geographical information systems officer Psychosocial   Psychosocial Workshop Healthy Sleep for a Healthy Heart   Instruction Review Code 1- Verbalizes Understanding   Class Start Time 1400   Class Stop Time 1455   Class Time Calculation (min) 55 min            Core Videos: Exercise    Move It!  Clinical staff conducted group or individual video education with verbal and written material and guidebook.  Patient learns the recommended [facility]ritikin exercise program. Exercise with the goal of living a long, healthy life. Some of the health benefits of exercise include controlled diabetes, healthier blood pressure levels,  improved cholesterol levels, improved heart and lung capacity, improved sleep, and better body composition. Everyone should speak with their doctor before starting or changing an exercise routine.  Biomechanical Limitations Clinical staff conducted group or individual video education with verbal and written material and guidebook.  Patient learns how biomechanical limitations can impact exercise and how we can mitigate and possibly overcome limitations to have an impactful and balanced exercise routine.  Body Composition Clinical staff conducted group or individual video education with verbal and written material and guidebook.  Patient learns that body composition (ratio of muscle mass to fat mass) is a key component to assessing overall fitness, rather than body weight  alone. Increased fat mass, especially visceral belly fat, can put [place] at increased risk for metabolic syndrome, type 2 diabetes, heart disease, and even death. It is recommended to combine diet and exercise (cardiovascular and resistance training) to improve your body composition. Seek guidance from your physician and exercise physiologist before implementing an exercise routine.  Exercise Action Plan Clinical staff conducted group or individual video education with verbal and written material and guidebook.  Patient learns the recommended strategies to achieve and enjoy long-term exercise adherence, including variety, self-motivation, self-efficacy, and positive decision making. Benefits of exercise include fitness, good health, weight management, more energy, better sleep, less stress, and overall well-being.  Medical   Heart Disease Risk Reduction Clinical staff conducted group or individual video education with verbal and written material and guidebook.  Patient learns our heart is our most vital organ as it circulates oxygen, nutrients, white blood cells, and hormones throughout the entire body, and carries waste away. Data  supports a plant-based eating plan like the Pritikin Program for its effectiveness in slowing progression of and reversing heart disease. The video provides a number of recommendations to address heart disease.   Metabolic Syndrome and Belly Fat  Clinical staff conducted group or individual video education with verbal and written material and guidebook.  Patient learns what metabolic syndrome is, how it leads to heart disease, and how one can reverse it and keep it from coming back. You have metabolic syndrome if you have 3 of the following 5 criteria: abdominal obesity, high blood pressure, high triglycerides, low HDL cholesterol, and high blood sugar.  Hypertension and Heart Disease Clinical staff conducted group or individual video education with verbal and written material and guidebook.  Patient learns that high blood pressure, or hypertension, is very common in the [place]. Hypertension is largely due to excessive salt intake, but other important risk factors include being overweight, physical inactivity, drinking too much alcohol, smoking, and not eating enough potassium from fruits and vegetables. High blood pressure is a leading risk factor for heart attack, stroke, congestive heart failure, dementia, kidney failure, and premature death. Long-term effects of excessive salt intake include stiffening of the arteries and thickening of heart muscle and organ damage. Recommendations include ways to reduce hypertension and the risk of heart disease.  Diseases of Our Time - Focusing on Diabetes Clinical staff conducted group or individual video education with verbal and written material and guidebook.  Patient learns why the best way to stop diseases of our time is prevention, through food and other lifestyle changes. Medicine (such as prescription pills and surgeries) is often only a Band-Aid on the problem, not a long-term solution. Most common diseases of our time include obesity, type 2  diabetes, hypertension, heart disease, and cancer. The Pritikin Program is recommended and has been proven to help reduce, reverse, and/or prevent the damaging effects of metabolic syndrome.  Nutrition   Overview of the Pritikin Eating Plan  Clinical staff conducted group or individual video education with verbal and written material and guidebook.  Patient learns about the Pritikin Eating Plan for disease risk reduction. The Pritikin Eating Plan emphasizes a wide variety of unrefined, minimally-processed carbohydrates, like fruits, vegetables, whole grains, and legumes. Go, Caution, and Stop food choices are explained. Plant-based and lean animal proteins are emphasized. Rationale provided for low sodium intake for blood pressure control, low added sugars for blood sugar stabilization, and low added fats and oils for coronary artery disease risk reduction and weight management.  Calorie  Density  Clinical staff conducted group or individual video education with verbal and written material and guidebook.  Patient learns about calorie density and how it impacts the Pritikin Eating Plan. Knowing the characteristics of the food you choose will help you decide whether those foods will lead to weight gain or weight loss, and whether you want to consume more or less of them. Weight loss is usually a side effect of the Pritikin Eating Plan because of its focus on low calorie-dense foods.  Label Reading  Clinical staff conducted group or individual video education with verbal and written material and guidebook.  Patient learns about the Pritikin recommended label reading guidelines and corresponding recommendations regarding calorie density, added sugars, sodium content, and whole grains.  Dining Out - Part 1  Clinical staff conducted group or individual video education with verbal and written material and guidebook.  Patient learns that restaurant meals can be sabotaging because they can be so high in  calories, fat, sodium, and/or sugar. Patient learns recommended strategies on how to positively address this and avoid unhealthy pitfalls.  Facts on Fats  Clinical staff conducted group or individual video education with verbal and written material and guidebook.  Patient learns that lifestyle modifications can be just as effective, if not more so, as many medications for lowering your risk of heart disease. A Pritikin lifestyle can help to reduce your risk of inflammation and atherosclerosis (cholesterol build-up, or plaque, in the artery walls). Lifestyle interventions such as dietary choices and physical activity address the cause of atherosclerosis. A review of the types of fats and their impact on blood cholesterol levels, along with dietary recommendations to reduce fat intake is also included.  Nutrition Action Plan  Clinical staff conducted group or individual video education with verbal and written material and guidebook.  Patient learns how to incorporate Pritikin recommendations into their lifestyle. Recommendations include planning and keeping personal health goals in mind as an important part of their success.  Healthy Mind-Set    Healthy Minds, Bodies, Hearts  Clinical staff conducted group or individual video education with verbal and written material and guidebook.  Patient learns how to identify when they are stressed. Video will discuss the impact of that stress, as well as the many benefits of stress management. Patient will also be introduced to stress management techniques. The way we think, act, and feel has an impact on our hearts.  How Our Thoughts Can Heal Our Hearts  Clinical staff conducted group or individual video education with verbal and written material and guidebook.  Patient learns that negative thoughts can cause depression and anxiety. This can result in negative lifestyle behavior and serious health problems. Cognitive behavioral therapy is an effective method to  help control our thoughts in order to change and improve our emotional outlook.  Additional Videos:  Exercise    Improving Performance  Clinical staff conducted group or individual video education with verbal and written material and guidebook.  Patient learns to use a non-linear approach by alternating intensity levels and lengths of time spent exercising to help burn more calories and lose more body fat. Cardiovascular exercise helps improve heart health, metabolism, hormonal balance, blood sugar control, and recovery from fatigue. Resistance training improves strength, endurance, balance, coordination, reaction time, metabolism, and muscle mass. Flexibility exercise improves circulation, posture, and balance. Seek guidance from your physician and exercise physiologist before implementing an exercise routine and learn your capabilities and proper form for all exercise.  Introduction to Yoga  Clinical  staff conducted group or individual video education with verbal and written material and guidebook.  Patient learns about yoga, a discipline of the coming together of mind, breath, and body. The benefits of yoga include improved flexibility, improved range of motion, better posture and core strength, increased lung function, weight loss, and positive self-image. Yoga's heart health benefits include lowered blood pressure, healthier heart rate, decreased cholesterol and triglyceride levels, improved immune function, and reduced stress. Seek guidance from your physician and exercise physiologist before implementing an exercise routine and learn your capabilities and proper form for all exercise.  Medical   Aging: Enhancing Your Quality of Life  Clinical staff conducted group or individual video education with verbal and written material and guidebook.  Patient learns key strategies and recommendations to stay in good physical health and enhance quality of life, such as prevention strategies, having an  advocate, securing a Health Care Proxy and Power of Attorney, and keeping a list of medications and system for tracking them. It also discusses how to avoid risk for bone loss.  Biology of Weight Control  Clinical staff conducted group or individual video education with verbal and written material and guidebook.  Patient learns that weight gain occurs because we consume more calories than we burn (eating more, moving less). Even if your body weight is normal, you may have higher ratios of fat compared to muscle mass. Too much body fat puts you at increased risk for cardiovascular disease, heart attack, stroke, type 2 diabetes, and obesity-related cancers. In addition to exercise, following the Pritikin Eating Plan can help reduce your risk.  Decoding Lab Results  Clinical staff conducted group or individual video education with verbal and written material and guidebook.  Patient learns that lab test reflects one measurement whose values change over time and are influenced by many factors, including medication, stress, sleep, exercise, food, hydration, pre-existing medical conditions, and more. It is recommended to use the knowledge from this video to become more involved with your lab results and evaluate your numbers to speak with your doctor.   Diseases of Our Time - Overview  Clinical staff conducted group or individual video education with verbal and written material and guidebook.  Patient learns that according to the CDC, 50% to 70% of chronic diseases (such as obesity, type 2 diabetes, elevated lipids, hypertension, and heart disease) are avoidable through lifestyle improvements including healthier food choices, listening to satiety cues, and increased physical activity.  Sleep Disorders Clinical staff conducted group or individual video education with verbal and written material and guidebook.  Patient learns how good quality and duration of sleep are important to overall health and  well-being. Patient also learns about sleep disorders and how they impact health along with recommendations to address them, including discussing with a physician.  Nutrition  Dining Out - Part 2 Clinical staff conducted group or individual video education with verbal and written material and guidebook.  Patient learns how to plan ahead and communicate in order to maximize their dining experience in a healthy and nutritious manner. Included are recommended food choices based on the type of restaurant the patient is visiting.   Fueling a Banker conducted group or individual video education with verbal and written material and guidebook.  There is a strong connection between our food choices and our health. Diseases like obesity and type 2 diabetes are very prevalent and are in large-part due to lifestyle choices. The Pritikin Eating Plan provides plenty of food and  hunger-curbing satisfaction. It is easy to follow, affordable, and helps reduce health risks.  Menu Workshop  Clinical staff conducted group or individual video education with verbal and written material and guidebook.  Patient learns that restaurant meals can sabotage health goals because they are often packed with calories, fat, sodium, and sugar. Recommendations include strategies to plan ahead and to communicate with the manager, chef, or server to help order a healthier meal.  Planning Your Eating Strategy  Clinical staff conducted group or individual video education with verbal and written material and guidebook.  Patient learns about the Pritikin Eating Plan and its benefit of reducing the risk of disease. The Pritikin Eating Plan does not focus on calories. Instead, it emphasizes high-quality, nutrient-rich foods. By knowing the characteristics of the foods, we choose, we can determine their calorie density and make informed decisions.  Targeting Your Nutrition Priorities  Clinical staff conducted group or  individual video education with verbal and written material and guidebook.  Patient learns that lifestyle habits have a tremendous impact on disease risk and progression. This video provides eating and physical activity recommendations based on your personal health goals, such as reducing LDL cholesterol, losing weight, preventing or controlling type 2 diabetes, and reducing high blood pressure.  Vitamins and Minerals  Clinical staff conducted group or individual video education with verbal and written material and guidebook.  Patient learns different ways to obtain key vitamins and minerals, including through a recommended healthy diet. It is important to discuss all supplements you take with your doctor.   Healthy Mind-Set    Smoking Cessation  Clinical staff conducted group or individual video education with verbal and written material and guidebook.  Patient learns that cigarette smoking and tobacco addiction pose a serious health risk which affects millions of people. Stopping smoking will significantly reduce the risk of heart disease, lung disease, and many forms of cancer. Recommended strategies for quitting are covered, including working with your doctor to develop a successful plan.  Culinary   Becoming a Set designer conducted group or individual video education with verbal and written material and guidebook.  Patient learns that cooking at home can be healthy, cost-effective, quick, and puts them in control. Keys to cooking healthy recipes will include looking at your recipe, assessing your equipment needs, planning ahead, making it simple, choosing cost-effective seasonal ingredients, and limiting the use of added fats, salts, and sugars.  Cooking - Breakfast and Snacks  Clinical staff conducted group or individual video education with verbal and written material and guidebook.  Patient learns how important breakfast is to satiety and nutrition through the entire  day. Recommendations include key foods to eat during breakfast to help stabilize blood sugar levels and to prevent overeating at meals later in the day. Planning ahead is also a key component.  Cooking - Educational psychologist conducted group or individual video education with verbal and written material and guidebook.  Patient learns eating strategies to improve overall health, including an approach to cook more at home. Recommendations include thinking of animal protein as a side on your plate rather than center stage and focusing instead on lower calorie dense options like vegetables, fruits, whole grains, and plant-based proteins, such as beans. Making sauces in large quantities to freeze for later and leaving the skin on your vegetables are also recommended to maximize your experience.  Cooking - Healthy Salads and Dressing Clinical staff conducted group or individual video education with verbal and  written material and guidebook.  Patient learns that vegetables, fruits, whole grains, and legumes are the foundations of the Pritikin Eating Plan. Recommendations include how to incorporate each of these in flavorful and healthy salads, and how to create homemade salad dressings. Proper handling of ingredients is also covered. Cooking - Soups and [employer] - Soups and Desserts Clinical staff conducted group or individual video education with verbal and written material and guidebook.  Patient learns that Pritikin soups and desserts make for easy, nutritious, and delicious snacks and meal components that are low in sodium, fat, sugar, and calorie density, while high in vitamins, minerals, and filling fiber. Recommendations include simple and healthy ideas for soups and desserts.   Overview     The Pritikin Solution Program Overview Clinical staff conducted group or individual video education with verbal and written material and guidebook.  Patient learns that the results of the  Pritikin Program have been documented in more than 100 articles published in peer-reviewed journals, and the benefits include reducing risk factors for (and, in some cases, even reversing) high cholesterol, high blood pressure, type 2 diabetes, obesity, and more! An overview of the three key pillars of the Pritikin Program will be covered: eating well, doing regular exercise, and having a healthy mind-set.  WORKSHOPS  Exercise: Exercise Basics: Building Your Action Plan Clinical staff led group instruction and group discussion with PowerPoint presentation and patient guidebook. To enhance the learning environment the use of posters, models and videos may be added. At the conclusion of this workshop, patients will comprehend the difference between physical activity and exercise, as well as the benefits of incorporating both, into their routine. Patients will understand the FITT (Frequency, Intensity, Time, and Type) principle and how to use it to build an exercise action plan. In addition, safety concerns and other considerations for exercise and cardiac rehab will be addressed by the presenter. The purpose of this lesson is to promote a comprehensive and effective weekly exercise routine in order to improve patients' overall level of fitness.   Managing Heart Disease: Your Path to a Healthier Heart Clinical staff led group instruction and group discussion with PowerPoint presentation and patient guidebook. To enhance the learning environment the use of posters, models and videos may be added.At the conclusion of this workshop, patients will understand the anatomy and physiology of the heart. Additionally, they will understand how Pritikin's three pillars impact the risk factors, the progression, and the management of heart disease.  The purpose of this lesson is to provide a high-level overview of the heart, heart disease, and how the Pritikin lifestyle positively impacts risk factors.  Exercise  Biomechanics Clinical staff led group instruction and group discussion with PowerPoint presentation and patient guidebook. To enhance the learning environment the use of posters, models and videos may be added. Patients will learn how the structural parts of their bodies function and how these functions impact their daily activities, movement, and exercise. Patients will learn how to promote a neutral spine, learn how to manage pain, and identify ways to improve their physical movement in order to promote healthy living. The purpose of this lesson is to expose patients to common physical limitations that impact physical activity. Participants will learn practical ways to adapt and manage aches and pains, and to minimize their effect on regular exercise. Patients will learn how to maintain good posture while sitting, walking, and lifting.  Balance Training and Fall Prevention  Clinical staff led group instruction and group discussion with  PowerPoint presentation and patient guidebook. To enhance the learning environment the use of posters, models and videos may be added. At the conclusion of this workshop, patients will understand the importance of their sensorimotor skills (vision, proprioception, and the vestibular system) in maintaining their ability to balance as they age. Patients will apply a variety of balancing exercises that are appropriate for their current level of function. Patients will understand the common causes for poor balance, possible solutions to these problems, and ways to modify their physical environment in order to minimize their fall risk. The purpose of this lesson is to teach patients about the importance of maintaining balance as they age and ways to minimize their risk of falling.  WORKSHOPS   Nutrition:  Fueling a Ship broker led group instruction and group discussion with PowerPoint presentation and patient guidebook. To enhance the learning  environment the use of posters, models and videos may be added. Patients will review the foundational principles of the Pritikin Eating Plan and understand what constitutes a serving size in each of the food groups. Patients will also learn Pritikin-friendly foods that are better choices when away from home and review make-ahead meal and snack options. Calorie density will be reviewed and applied to three nutrition priorities: weight maintenance, weight loss, and weight gain. The purpose of this lesson is to reinforce (in a group setting) the key concepts around what patients are recommended to eat and how to apply these guidelines when away from home by planning and selecting Pritikin-friendly options. Patients will understand how calorie density may be adjusted for different weight management goals.  Mindful Eating  Clinical staff led group instruction and group discussion with PowerPoint presentation and patient guidebook. To enhance the learning environment the use of posters, models and videos may be added. Patients will briefly review the concepts of the Pritikin Eating Plan and the importance of low-calorie dense foods. The concept of mindful eating will be introduced as well as the importance of paying attention to internal hunger signals. Triggers for non-hunger eating and techniques for dealing with triggers will be explored. The purpose of this lesson is to provide patients with the opportunity to review the basic principles of the Pritikin Eating Plan, discuss the value of eating mindfully and how to measure internal cues of hunger and fullness using the Hunger Scale. Patients will also discuss reasons for non-hunger eating and learn strategies to use for controlling emotional eating.  Targeting Your Nutrition Priorities Clinical staff led group instruction and group discussion with PowerPoint presentation and patient guidebook. To enhance the learning environment the use of posters, models and  videos may be added. Patients will learn how to determine their genetic susceptibility to disease by reviewing their family history. Patients will gain insight into the importance of diet as part of an overall healthy lifestyle in mitigating the impact of genetics and other environmental insults. The purpose of this lesson is to provide patients with the opportunity to assess their personal nutrition priorities by looking at their family history, their own health history and current risk factors. Patients will also be able to discuss ways of prioritizing and modifying the Pritikin Eating Plan for their highest risk areas  Menu  Clinical staff led group instruction and group discussion with PowerPoint presentation and patient guidebook. To enhance the learning environment the use of posters, models and videos may be added. Using menus brought in from [employer], or printed from [employer], patients will apply the Pritikin dining out  guidelines that were presented in the [employer] video. Patients will also be able to practice these guidelines in a variety of provided scenarios. The purpose of this lesson is to provide patients with the opportunity to practice hands-on learning of the Pritikin Dining Out guidelines with actual menus and practice scenarios.  Label Reading Clinical staff led group instruction and group discussion with PowerPoint presentation and patient guidebook. To enhance the learning environment the use of posters, models and videos may be added. Patients will review and discuss the Pritikin label reading guidelines presented in Pritikin's Label Reading Educational series video. Using fool labels brought in from local grocery stores and markets, patients will apply the label reading guidelines and determine if the packaged food meet the Pritikin guidelines. The purpose of this lesson is to provide patients with the opportunity to review, discuss, and practice  hands-on learning of the Pritikin Label Reading guidelines with actual packaged food labels. Cooking School  Pritikin's [employer] are designed to teach patients ways to prepare quick, simple, and affordable recipes at home. The importance of nutrition's role in chronic disease risk reduction is reflected in its emphasis in the overall Pritikin program. By learning how to prepare essential core Pritikin Eating Plan recipes, patients will increase control over what they eat; be able to customize the flavor of foods without the use of added salt, sugar, or fat; and improve the quality of the food they consume. By learning a set of core recipes which are easily assembled, quickly prepared, and affordable, patients are more likely to prepare more healthy foods at home. These workshops focus on convenient breakfasts, simple entres, side dishes, and desserts which can be prepared with minimal effort and are consistent with nutrition recommendations for cardiovascular risk reduction. Cooking [employer] are taught by a Armed forces logistics/support/administrative officer (RD) who has been trained by the [employer]. The chef or RD has a clear understanding of the importance of minimizing - if not completely eliminating - added fat, sugar, and sodium in recipes. Throughout the series of Cooking School Workshop sessions, patients will learn about healthy ingredients and efficient methods of cooking to build confidence in their capability to prepare    Cooking School weekly topics:  Adding Flavor- Sodium-Free  Fast and Healthy Breakfasts  Powerhouse Plant-Based Proteins  Satisfying Salads and Dressings  Simple Sides and Sauces  International Cuisine-Spotlight on the [employer] Zones  Delicious Desserts  Savory Soups  [employer] - Meals in a Astronomer Appetizers and Snacks  Comforting Weekend Breakfasts  One-Pot Wonders   Fast Evening Meals  Landscape architect Your Prit[employer]  Plate  WORKSHOPS   Healthy Mindset (Psychosocial):  Focused Goals, Sustainable Changes Clinical staff led group instruction and group discussion with PowerPoint presentation and patient guidebook. To enhance the learning environment the use of posters, models and videos may be added. Patients will be able to apply effective goal setting strategies to establish at least one personal goal, and then take consistent, meaningful action toward that goal. They will learn to identify common barriers to achieving personal goals and develop strategies to overcome them. Patients will also gain an understanding of how our mind-set can impact our ability to achieve goals and the importance of cultivating a positive and growth-oriented mind-set. The purpose of this lesson is to provide patients with a deeper understanding of how to set and achieve personal goals, as well as the tools and strategies needed to overcome common  obstacles which may arise along the way.  From Head to Heart: The Power of a Healthy Outlook  Clinical staff led group instruction and group discussion with PowerPoint presentation and patient guidebook. To enhance the learning environment the use of posters, models and videos may be added. Patients will be able to recognize and describe the impact of emotions and mood on physical health. They will discover the importance of self-care and explore self-care practices which may work for them. Patients will also learn how to utilize the 4 C's to cultivate a healthier outlook and better manage stress and challenges. The purpose of this lesson is to demonstrate to patients how a healthy outlook is an essential part of maintaining good health, especially as they continue their cardiac rehab journey.  Healthy Sleep for a Healthy Heart Clinical staff led group instruction and group discussion with PowerPoint presentation and patient guidebook. To enhance the learning environment the use of posters,  models and videos may be added. At the conclusion of this workshop, patients will be able to demonstrate knowledge of the importance of sleep to overall health, well-being, and quality of life. They will understand the symptoms of, and treatments for, common sleep disorders. Patients will also be able to identify daytime and nighttime behaviors which impact sleep, and they will be able to apply these tools to help manage sleep-related challenges. The purpose of this lesson is to provide patients with a general overview of sleep and outline the importance of quality sleep. Patients will learn about a few of the most common sleep disorders. Patients will also be introduced to the concept of "sleep hygiene," and discover ways to self-manage certain sleeping problems through simple daily behavior changes. Finally, the workshop will motivate patients by clarifying the links between quality sleep and their goals of heart-healthy living.   Recognizing and Reducing Stress Clinical staff led group instruction and group discussion with PowerPoint presentation and patient guidebook. To enhance the learning environment the use of posters, models and videos may be added. At the conclusion of this workshop, patients will be able to understand the types of stress reactions, differentiate between acute and chronic stress, and recognize the impact that chronic stress has on their health. They will also be able to apply different coping mechanisms, such as reframing negative self-talk. Patients will have the opportunity to practice a variety of stress management techniques, such as deep abdominal breathing, progressive muscle relaxation, and/or guided imagery.  The purpose of this lesson is to educate patients on the role of stress in their lives and to provide healthy techniques for coping with it.  Learning Barriers/Preferences:  Learning Barriers/Preferences - 12/07/22 1536       Learning Barriers/Preferences   Learning  Barriers Sight   wears glasses   Learning Preferences Audio;Computer/Internet;Group Instruction;Individual Instruction;Pictoral;Skilled Demonstration;Verbal Instruction;Video;Written Material             Education Topics:  Knowledge Questionnaire Score:  Knowledge Questionnaire Score - 12/07/22 1536       Knowledge Questionnaire Score   Pre Score 21/23             Core Components/Risk Factors/Patient Goals at Admission:  Personal Goals and Risk Factors at Admission - 12/07/22 1538       Core Components/Risk Factors/Patient Goals on Admission   Hypertension Yes    Intervention Provide education on lifestyle modifcations including regular physical activity/exercise, weight management, moderate sodium restriction and increased consumption of fresh fruit, vegetables, and low fat dairy, alcohol moderation, and  smoking cessation.;Monitor prescription use compliance.    Expected Outcomes Short Term: Continued assessment and intervention until BP is < 140/16mm HG in hypertensive participants. < 130/37mm HG in hypertensive participants with diabetes, heart failure or chronic kidney disease.;Long Term: Maintenance of blood pressure at goal levels.    Lipids Yes    Intervention Provide education and support for participant on nutrition & aerobic/resistive exercise along with prescribed medications to achieve LDL 70mg , HDL >40mg .    Expected Outcomes Short Term: Participant states understanding of desired cholesterol values and is compliant with medications prescribed. Participant is following exercise prescription and nutrition guidelines.;Long Term: Cholesterol controlled with medications as prescribed, with individualized exercise RX and with personalized nutrition plan. Value goals: LDL < 70mg , HDL > 40 mg.    Stress Yes    Intervention Offer individual and/or small group education and counseling on adjustment to heart disease, stress management and health-related lifestyle change. Teach  and support self-help strategies.;Refer participants experiencing significant psychosocial distress to appropriate mental health specialists for further evaluation and treatment. When possible, include family members and significant others in education/counseling sessions.    Expected Outcomes Short Term: Participant demonstrates changes in health-related behavior, relaxation and other stress management skills, ability to obtain effective social support, and compliance with psychotropic medications if prescribed.;Long Term: Emotional wellbeing is indicated by absence of clinically significant psychosocial distress or social isolation.             Core Components/Risk Factors/Patient Goals Review:   Goals and Risk Factor Review     Row Name 12/14/22 0927 12/29/22 1459           Core Components/Risk Factors/Patient Goals Review   Personal Goals Review Weight Management/Obesity;Stress;Hypertension;Lipids Weight Management/Obesity;Stress;Hypertension;Lipids      Review Tiffany Mayo started cardiac rehab on 12/13/22 and did well with exercise. Vital signs were stable Tiffany Mayo started cardiac rehab on 12/13/22 and has been doing well with exercise. Vital signs remain stable. No further reports of feeling lightheaded since Imdur placed on hold      Expected Outcomes Tiffany Mayo will continue to participate in intensive cardiac rehab for exercise, nutrition and lifestyle modifications Tiffany Mayo will continue to participate in intensive cardiac rehab for exercise, nutrition and lifestyle modifications               Core Components/Risk Factors/Patient Goals at Discharge (Final Review):   Goals and Risk Factor Review - 12/29/22 1459       Core Components/Risk Factors/Patient Goals Review   Personal Goals Review Weight Management/Obesity;Stress;Hypertension;Lipids    Review Shayleigh started cardiac rehab on 12/13/22 and has been doing well with exercise. Vital signs remain stable. No further reports of feeling  lightheaded since Imdur placed on hold    Expected Outcomes Cordie will continue to participate in intensive cardiac rehab for exercise, nutrition and lifestyle modifications             ITP Comments:  ITP Comments     Row Name 12/07/22 1519 12/14/22 0912 12/29/22 1456       ITP Comments Tiffany Magic, MD: Medical Director.  Introduction to the [employer] / Intensive Cardiac Rehab.  Initial orientation packet reviewed with the patient. 30 Day ITP Review. Tiffany Mayo started cardiac rehab on 12/13/22 and did well with exercise. 30 Day ITP Review. Tiffany Mayo is doing well with exercise at  cardiac rehab. Tiffany Mayo has not reported having any more lightheadedness since Imdur placed on hold  Comments: See ITP comments.Thayer Headings RN BSN

## 2022-12-31 ENCOUNTER — Encounter (HOSPITAL_COMMUNITY)
Admission: RE | Admit: 2022-12-31 | Discharge: 2022-12-31 | Disposition: A | Discharge status: Home / Self Care | Payer: Medicare PPO | Source: Ambulatory Visit | Attending: Cardiovascular Disease | Admitting: Cardiovascular Disease

## 2022-12-31 DIAGNOSIS — Z955 Presence of coronary angioplasty implant and graft: Secondary | ICD-10-CM | POA: Diagnosis not present

## 2022-12-31 DIAGNOSIS — R079 Chest pain, unspecified: Secondary | ICD-10-CM | POA: Diagnosis not present

## 2023-01-03 ENCOUNTER — Encounter (HOSPITAL_COMMUNITY)
Admission: RE | Admit: 2023-01-03 | Discharge: 2023-01-03 | Disposition: A | Discharge status: Home / Self Care | Payer: Medicare PPO | Source: Ambulatory Visit | Attending: Cardiovascular Disease | Admitting: Cardiovascular Disease

## 2023-01-03 DIAGNOSIS — R079 Chest pain, unspecified: Secondary | ICD-10-CM | POA: Diagnosis not present

## 2023-01-03 DIAGNOSIS — Z955 Presence of coronary angioplasty implant and graft: Secondary | ICD-10-CM

## 2023-01-03 NOTE — Progress Notes (Signed)
CARDIAC REHAB PHASE 2  Reviewed home exercise with pt today. Pt is tolerating exercise well. Pt will continue to exercise on her own by walking, kayaking, weight and stretches for 30-60 minutes per session 1-2 days a week in addition to the 3 days in CRP2. Advised pt on THRR, RPE scale, hydration and temperature/humidity precautions. Reinforced NTG use, S/S to stop exercise and when to call MD vs 911. Encouraged warm up cool down and stretches with exercise sessions. Pt verbalized understanding, all questions were answered and pt was given a copy to take home.    Harrie Jeans ACSM-CEP 01/03/2023 4:42 PM

## 2023-01-05 ENCOUNTER — Encounter: Payer: Self-pay | Admitting: Adult Health

## 2023-01-05 ENCOUNTER — Encounter (HOSPITAL_COMMUNITY): Admission: RE | Admit: 2023-01-05 | Payer: Medicare PPO | Source: Ambulatory Visit

## 2023-01-05 DIAGNOSIS — R079 Chest pain, unspecified: Secondary | ICD-10-CM

## 2023-01-05 DIAGNOSIS — Z955 Presence of coronary angioplasty implant and graft: Secondary | ICD-10-CM

## 2023-01-05 NOTE — Progress Notes (Signed)
Was called by cardiac rehab nurses to assess patient who had chest pain while exercising.  The patient described it as a substernal burning 2 out of 10, that occurred for approximately 5 minutes.  Pain went away immediately upon rest.  Patient apparently had similar episode on last cardiac rehab exercise session 3 days earlier but did not report discomfort.  The patient states that the discomfort was similar to that which she had prior to having PCI with stent placement.  Patient had PCI with stent placement to LAD, secondary to severe mid LAD stenosis treated with 2.75 x 16 mm Synergy drug-eluting stent; also had severe mid circumflex/first OM sequential stenosis treated successfully with a single stent covering both lesions 2.5 x 28 mm Synergy drug-eluting stent on 2/21/ 2024 by Dr. Tonny Bollman.  Last seen by Dr. Flora Lipps on 11/23/2022 and was asymptomatic and was to be weaned off of Imdur on next visit.   Patient states that she has occasional exertional chest burning when walking up an incline in her yard.  It is noted that cardiac rehab did increase her exercise level from 2-3 this last week when she has now become symptomatic. She only has discomfort with exertional activities in cardiac rehab recently. No diaphoresis, dizziness, or significant DOE.  Blood pressure 126/60 after stopping exercise.  Nurses were unable to hear a strong heartbeat and only counted at 49 bpm.  EKG revealed sinus bradycardia heart rate 55 bpm without acute ST T wave abnormalities.. She appears to be having stable angina.   Patient is completely pain free and asymptomatic currently. She will go home and rest. She will not participate in cardiac rehab until evaluated in the office. She is to see Dr. Flora Lipps or APP first available. She is advised to go to ED via EMS if symptoms return, worsen with activity or occur while at rest.  She is not to drive home but son is coming to pick her up.

## 2023-01-05 NOTE — Progress Notes (Signed)
Incomplete Session Note  Patient Details  Name: Tiffany Mayo MRN: [id number] Date of Birth: 08/12/1940 Referring Provider:   Flowsheet Row INTENSIVE CARDIAC REHAB ORIENT from 12/07/2022 in [facility]  Referring Provider Lennie Odor, Md       [name] did not complete her rehab session.    [name] informed staff of CP in center of chest 2/10, burning sensation. Pt stated she has this CP on Monday with exercise. Pt stated she's only had the CP while she has been here exercising radiating to left chest. 12 lead EKG captured. C/O left shoulder pain, but stated she thinks it from "reaching back". No nitroglycerin taken. Pt stated CP could be due stress of possibly moving her husband to [facility] for memory care.   [name], DNP notified to come assess pt. Recommends stopping exercise until cleared from Dr. Flora Lipps. Instructions/education provided about re-current CP, nitroglycerin and when to seek ED care. Pt understands without assistance.   Pt called son to pick her up from rehab.   Exit BP 122/68 while in exam room, HR 51, O2 100%, pt stated she still has a dull pain. Stated it could be from the bean, onion salad sample served in [employer] class today. Pt stated it feels like she needs to belch. Discomfort 1/10.   Pt's son came to p/u patient.

## 2023-01-06 ENCOUNTER — Telehealth (HOSPITAL_COMMUNITY): Payer: Self-pay

## 2023-01-06 NOTE — Telephone Encounter (Signed)
Called [facility], appointment scheduled for Tuesday 8/13 at 0845 with Edd Fabian, NP. Pt called and VM left about cards appointment.

## 2023-01-07 ENCOUNTER — Encounter (HOSPITAL_COMMUNITY): Payer: Medicare PPO

## 2023-01-09 NOTE — Progress Notes (Unsigned)
Cardiology Clinic Note   Patient Name: Tiffany Mayo Date of Encounter: 01/11/2023  Primary Care Provider:  Gaspar Garbe, MD Primary Cardiologist:  Reatha Harps, MD  Patient Profile    Tiffany Mayo 82 year old female presents the clinic today for follow-up evaluation of her chest discomfort.  Past Medical History    Past Medical History:  Diagnosis Date   Arthritis    spine, neck   Coronary artery disease    GERD (gastroesophageal reflux disease)    Headache    SINUS    Hemorrhoid    Hyperlipemia    Hypertension    Hypothyroidism    Seizures (HCC)    Past Surgical History:  Procedure Laterality Date   ABDOMINAL HYSTERECTOMY  1990   ANTERIOR CERVICAL DECOMP/DISCECTOMY FUSION  04/03/2012   Procedure: ANTERIOR CERVICAL DECOMPRESSION/DISCECTOMY FUSION 1 LEVEL;  Surgeon: Hewitt Shorts, MD;  Location: [facility];  Service: Neurosurgery;  Laterality: N/A;  Cervical Five-Six Anterior Cervical Decompression with Fusion Plating and Bonegraft   BUNIONECTOMY     RIGHT FOOT   CARDIAC CATHETERIZATION     CORONARY STENT INTERVENTION N/A 07/21/2022   Procedure: CORONARY STENT INTERVENTION;  Surgeon: Tonny Bollman, MD;  Location: [facility] INVASIVE CV LAB;  Service: Cardiovascular;  Laterality: N/A;   EYE SURGERY     Lasik, Catarct   GANGLION CYST EXCISION     BIL WRIST    JOINT REPLACEMENT     Right middle finger   LEFT HEART CATH AND CORONARY ANGIOGRAPHY N/A 07/08/2022   Procedure: LEFT HEART CATH AND CORONARY ANGIOGRAPHY;  Surgeon: Lennette Bihari, MD;  Location: [facility] INVASIVE CV LAB;  Service: Cardiovascular;  Laterality: N/A;   TRIGGER FINGER RELEASE     thumbs    Allergies  Allergies  Allergen Reactions   Latex Rash   Codeine Nausea And Vomiting   Sulfa Antibiotics Nausea And Vomiting    History of Present Illness    Tiffany Mayo has a PMH of coronary artery disease, TIA, seizure disorder, hyperlipidemia, and GERD.  She underwent cardiac  catheterization and received PCI with DES to her mid LAD and LCx/OM1 on 07/21/2022.  She was seen in follow-up by Dr. Flora Lipps on 11/24/2022.  She was doing well at that time.  She had been seen in the emergency department in [place] in March.  There was question as to whether she had TIA versus seizure disorder.  She was following with neurology.  She was taking seizure medication.  She reported low energy and fatigue.  It was felt that this was a side effect of her medication.  Her blood pressure was well-controlled.  She denied chest pain.  She did note some shortness of breath with activity however her breathing was improving.  She was encouraged to remain active.  Her vital signs were stable.  Her EKG from [facility] showed sinus bradycardia.  She was weaned off of her Imdur.  Follow-up was planned for 6 months.  She was seen and evaluated by Bailey Mech, DNP on 01/05/2023.  She had been exercising at cardiac rehab and developed chest discomfort.  Her chest discomfort resolved with rest.  It was felt that she was having stable angina.  She was instructed to follow-up with cardiology before returning to cardiac rehab.  She presents to the clinic today for follow-up evaluation and states she is noticed intermittent periods of burning type sensation in her chest since stopping Imdur.  She reports episodes at rest and  feels that some of her symptoms are brought on by stress.  She is the primary caregiver for her husband who has started to show signs of dementia.  We reviewed her cardiac catheterization.  She and her son expressed understanding.  She did note some dizziness with taking Imdur and I encouraged her to maintain p.o. hydration.  She also notes some sensation of needing to belch at the time of her chest burning intermittently.  She has been compliant with her pantoprazole.  She has noted that in the past when she eats papaya it helps with her indigestion.  She reports that she will try this to see if  it continues to help her symptoms.  We reviewed sublingual nitroglycerin use.  She reports she needs a refill.  Her blood pressure today is 110/62.  Finally, she notes increased fatigue and attributes this to stress and strict heart healthy diet.  She notes that when she treats herself with ice cream she feels much better.  I will repeat echocardiogram, resume 15 mg of Imdur, refill Plavix and nitroglycerin.  We will plan follow-up in 2 to 3 months.  She may return to cardiac rehab.  I have also asked her to maintain her p.o. hydration.   Home Medications    Prior to Admission medications   Medication Sig Start Date End Date Taking? Authorizing Provider  acetaminophen (TYLENOL) 500 MG tablet Take 500-1,000 mg by mouth every 6 (six) hours as needed (pain.).    [provider]  aspirin EC 81 MG tablet Take 1 tablet (81 mg total) by mouth daily. Mayo whole. 04/08/22 04/08/23  Osvaldo Shipper, MD  atorvastatin (LIPITOR) 80 MG tablet Take 80 mg by mouth in the morning.    [provider]  clopidogrel (PLAVIX) 75 MG tablet Take 1 tablet (75 mg total) by mouth daily. 07/09/22 07/09/23  Arty Baumgartner, NP  cycloSPORINE (RESTASIS) 0.05 % ophthalmic emulsion Place 1 drop into both eyes 2 (two) times daily.    [provider]  escitalopram (LEXAPRO) 5 MG tablet Take 5 mg by mouth in the morning.    [provider]  isosorbide mononitrate (IMDUR) 30 MG 24 hr tablet Take 0.5 tablets (15 mg total) by mouth daily. Patient not taking: Reported on 12/20/2022 08/13/22   Sande Rives, MD  levETIRAcetam (KEPPRA XR) 500 MG 24 hr tablet Take 1 tablet (500 mg total) by mouth daily. 09/02/22   Micki Riley, MD  levothyroxine (SYNTHROID) 50 MCG tablet Take 50 mcg by mouth every other day.    [provider]  levothyroxine (SYNTHROID) 75 MCG tablet Take 75 mcg by mouth every other day.    [provider]  Multiple Vitamins-Minerals (AIRBORNE PO) Take 3 tablets  by mouth daily.    [provider]  nitroGLYCERIN (NITROSTAT) 0.4 MG SL tablet Place 1 tablet (0.4 mg total) under the tongue every 5 (five) minutes as needed for chest pain. Patient not taking: Reported on 11/24/2022 07/02/22 09/30/22  Sande Rives, MD  olmesartan (BENICAR) 20 MG tablet Take 20 mg by mouth in the morning.    [provider]  pantoprazole (PROTONIX) 40 MG tablet Take 1 tablet (40 mg total) by mouth in the morning. 08/13/22   O'NealRonnald Ramp, MD    Family History    Family History  Problem Relation Age of Onset   Heart disease Father    She indicated that her mother is deceased. She indicated that her father is deceased.  Social History    Social History   Socioeconomic History   Marital status: Married    Spouse name: Not on file   Number of children: 2   Years of education: Not on file   Highest education level: Not on file  Occupational History   Occupation: Runner, broadcasting/film/video First Grade - Retired  Tobacco Use   Smoking status: Never   Smokeless tobacco: Never  Substance and Sexual Activity   Alcohol use: Yes    Comment: OCC WINE    Drug use: No   Sexual activity: Not on file  Other Topics Concern   Not on file  Social History Narrative   Not on file   Social Determinants of Health   Financial Resource Strain: Not on file  Food Insecurity: Low Risk  (08/21/2022)   Received from [facility] Insecurity    Worried About Running Out of Food in the Last Year: Not on file    Within the past 12 months, the food you bought just didn't last and you didn't have money to get more.: Never true  Transportation Needs: Low Risk  (08/21/2022)   Received from [facility], [facility]   Transportation    In the past 12 months, has lack of transportation kept you from medical appointments or from getting medications?: No    Lack of Transportation (Non-Medical): Not on  file  Physical Activity: Not on file  Stress: Not on file  Social Connections: Not on file  Intimate Partner Violence: Low Risk  (08/21/2022)   [facility], [facility]   Interpersonal Safety    Feels UN-safe at Home or Work/School: no     Review of Systems    General:  No chills, fever, night sweats or weight changes.  Cardiovascular:  No chest pain, dyspnea on exertion, edema, orthopnea, palpitations, paroxysmal nocturnal dyspnea. Dermatological: No rash, lesions/masses Respiratory: No cough, dyspnea Urologic: No hematuria, dysuria Abdominal:   No nausea, vomiting, diarrhea, bright red blood per rectum, melena, or hematemesis Neurologic:  No visual changes, wkns, changes in mental status. All other systems reviewed and are otherwise negative except as noted above.  Physical Exam    VS:  BP 110/62   Pulse 65   Ht 5\' 3"  (1.6 m)   Wt 136 lb 12.8 oz (62.1 kg)   SpO2 96%   BMI 24.23 kg/m  , BMI Body mass index is 24.23 kg/m. GEN: Well nourished, well developed, in no acute distress. HEENT: normal. Neck: Supple, no JVD, carotid bruits, or masses. Cardiac: RRR, no murmurs, rubs, or gallops. No clubbing, cyanosis, edema.  Radials/DP/PT 2+ and equal bilaterally.  Respiratory:  Respirations regular and unlabored, clear to auscultation bilaterally. GI: Soft, nontender, nondistended, BS + x 4. MS: no deformity or atrophy. Skin: warm and dry, no rash. Neuro:  Strength and sensation are intact. Psych: Normal affect.  Accessory Clinical Findings    Recent Labs: 04/07/2022: ALT 14 04/08/2022: TSH 4.968 07/22/2022: BUN 19; Hemoglobin 11.5; Platelets 159; Potassium 3.8; Sodium 140 09/30/2022: Creatinine, Ser 1.12   Recent Lipid Panel    Component Value Date/Time   CHOL 125 11/17/2022 0856   TRIG 93 11/17/2022 0856   HDL 45 11/17/2022 0856   CHOLHDL 2.8 11/17/2022 0856   CHOLHDL 4.6 04/08/2022 0000   VLDL 21 04/08/2022 0000   LDLCALC  62 11/17/2022 0856  ECG personally reviewed by me today-none today.     Cardiac catheterization 07/21/2022  Diagnostic Dominance: Right  Intervention       Assessment & Plan   1.  Coronary artery disease-intermittent episodes of chest discomfort that appear to be related to acid reflux, stress, and are atypical in nature..  Developed chest discomfort 2 out of 10 while exercising at cardiac rehab.  Chest discomfort resolved with rest.  She had been weaned off of Imdur. Stress reduction Continue aspirin, atorvastatin, olmesartan, nitroglycerin as needed Resume Imdur 15 mg daily May return to cardiac rehab  Essential hypertension-BP today 110/62. Continue olmesartan Maintain blood pressure log  Hyperlipidemia-LDL 62 on 11/17/2022. High-fiber diet Continue aspirin, atorvastatin  Sinus bradycardia-65 bpm Continue to monitor  Disposition: Follow-up with Dr. Flora Lipps or me in 2-3 months. Heart healthy low-sodium diet   Thomasene Ripple. Enma Maeda NP-C     01/11/2023, 9:47 AM [facility] [address] Office [phone] Fax [phone]    I spent 14 minutes examining this patient, reviewing medications, and using patient centered shared decision making involving her cardiac care.  Prior to her visit I spent greater than 20 minutes reviewing her past medical history,  medications, and prior cardiac tests.

## 2023-01-10 ENCOUNTER — Encounter (HOSPITAL_COMMUNITY): Payer: Medicare PPO

## 2023-01-11 ENCOUNTER — Ambulatory Visit: Payer: Medicare PPO | Attending: General Practice | Admitting: General Practice

## 2023-01-11 ENCOUNTER — Encounter: Payer: Self-pay | Admitting: General Practice

## 2023-01-11 VITALS — BP 110/62 | HR 65 | Ht 63.0 in | Wt 136.8 lb

## 2023-01-11 DIAGNOSIS — E785 Hyperlipidemia, unspecified: Secondary | ICD-10-CM

## 2023-01-11 DIAGNOSIS — I10 Essential (primary) hypertension: Secondary | ICD-10-CM | POA: Diagnosis not present

## 2023-01-11 DIAGNOSIS — R001 Bradycardia, unspecified: Secondary | ICD-10-CM | POA: Diagnosis not present

## 2023-01-11 DIAGNOSIS — I25118 Atherosclerotic heart disease of native coronary artery with other forms of angina pectoris: Secondary | ICD-10-CM

## 2023-01-11 MED ORDER — CLOPIDOGREL BISULFATE 75 MG PO TABS: 75.0000 mg | ORAL_TABLET | Freq: Every day | ORAL | 7 refills | Status: DC

## 2023-01-11 MED ORDER — NITROGLYCERIN 0.4 MG SL SUBL: 0.4000 mg | SUBLINGUAL_TABLET | SUBLINGUAL | 1 refills | Status: DC | PRN

## 2023-01-11 MED ORDER — ISOSORBIDE MONONITRATE ER 30 MG PO TB24: 15.0000 mg | ORAL_TABLET | Freq: Every day | ORAL | 7 refills | Status: DC

## 2023-01-11 NOTE — Patient Instructions (Signed)
Medication Instructions:  RESTART IMDUR 15MG  DAILY(1/2 TAB) *If you need a refill on your cardiac medications before your next appointment, please call your pharmacy*  Lab Work: NONE If you have labs (blood work) drawn today and your tests are completely normal, you will receive your results only by:  MyChart Message (if you have MyChart) OR  A paper copy in the mail If you have any lab test that is abnormal or we need to change your treatment, we will call you to review the results.  Testing/Procedures: Your physician has requested that you have an echocardiogram. Echocardiography is a painless test that uses sound waves to create images of your heart. It provides your doctor with information about the size and shape of your heart and how well your heart's chambers and valves are working. This procedure takes approximately one hour. There are no restrictions for this procedure. Please do NOT wear cologne, perfume, aftershave, or lotions (deodorant is allowed). Please arrive 15 minutes prior to your appointment time.   Follow-Up: At [facility], you and your health needs are our priority.  As part of our continuing mission to provide you with exceptional heart care, we have created designated Provider Care Teams.  These Care Teams include your primary Cardiologist (physician) and Advanced Practice Providers (APPs -  Physician Assistants and Nurse Practitioners) who all work together to provide you with the care you need, when you need it.  Your next appointment:   2-3 month(s)  Provider:   Reatha Harps, MD  or Edd Fabian, FNP        Other Instructions

## 2023-01-12 ENCOUNTER — Encounter (HOSPITAL_COMMUNITY): Admission: RE | Admit: 2023-01-12 | Payer: Medicare PPO | Source: Ambulatory Visit

## 2023-01-12 DIAGNOSIS — Z955 Presence of coronary angioplasty implant and graft: Secondary | ICD-10-CM | POA: Diagnosis not present

## 2023-01-12 DIAGNOSIS — R079 Chest pain, unspecified: Secondary | ICD-10-CM | POA: Diagnosis not present

## 2023-01-14 ENCOUNTER — Encounter (HOSPITAL_COMMUNITY)
Admission: RE | Admit: 2023-01-14 | Discharge: 2023-01-14 | Disposition: A | Discharge status: Home / Self Care | Payer: Medicare PPO | Source: Ambulatory Visit | Attending: Cardiovascular Disease | Admitting: Cardiovascular Disease

## 2023-01-14 DIAGNOSIS — Z955 Presence of coronary angioplasty implant and graft: Secondary | ICD-10-CM | POA: Diagnosis not present

## 2023-01-14 DIAGNOSIS — R079 Chest pain, unspecified: Secondary | ICD-10-CM | POA: Diagnosis not present

## 2023-01-17 ENCOUNTER — Encounter (HOSPITAL_COMMUNITY)
Admission: RE | Admit: 2023-01-17 | Discharge: 2023-01-17 | Disposition: A | Discharge status: Home / Self Care | Payer: Medicare PPO | Source: Ambulatory Visit | Attending: Cardiovascular Disease | Admitting: Cardiovascular Disease

## 2023-01-17 ENCOUNTER — Telehealth (HOSPITAL_COMMUNITY): Payer: Self-pay | Admitting: *Deleted

## 2023-01-17 DIAGNOSIS — R079 Chest pain, unspecified: Secondary | ICD-10-CM | POA: Diagnosis not present

## 2023-01-17 DIAGNOSIS — Z955 Presence of coronary angioplasty implant and graft: Secondary | ICD-10-CM | POA: Diagnosis not present

## 2023-01-17 NOTE — Telephone Encounter (Signed)
Called patient back she said had burning in her chest walking to the mail box. Denies chest pain. Jelene said that her blood pressure was low on Friday she had to stay after. Thayer Headings RN BSN

## 2023-01-17 NOTE — Progress Notes (Signed)
Velena had burning in her chest walking to the mail box. Denies chest pain currently. Joule said that her blood pressure was low on Friday she had to stay after. Vital signs are as follows. Sitting blood pressure 124/78 Standing blood pressure 104/70. Telemetry rhythm Sinus 63. Patient instructed to drink water. Jeanine restarted taking Imdur per Edd Fabian NP. Onsite provider NP Edd Fabian notified about today's symptoms. Verdon Cummins reported having some chest pressure on the recumbent bike.wanted to let you know that Adanna reported chest burning on the recumbent bike her mets were 3.2 I had her stop for a few minutes until the burning went away. She is now exercising at a lower workload and feels okay. Vital signs and CBG's were stable. Will continue to monitor the patient throughout  the program.Brentton Wardlow Mila Palmer RN BSN

## 2023-01-18 ENCOUNTER — Ambulatory Visit (HOSPITAL_COMMUNITY): Payer: Medicare PPO | Attending: Cardiology

## 2023-01-18 DIAGNOSIS — I25118 Atherosclerotic heart disease of native coronary artery with other forms of angina pectoris: Secondary | ICD-10-CM | POA: Insufficient documentation

## 2023-01-18 LAB — ECHOCARDIOGRAM COMPLETE
AR max vel: 1.46 cm2
AV Area VTI: 1.49 cm2
AV Area mean vel: 1.43 cm2
AV Mean grad: 8.7 mmHg
AV Peak grad: 17.5 mmHg
Ao pk vel: 2.09 m/s
Area-P 1/2: 3.45 cm2
P 1/2 time: 626 msec
S' Lateral: 2.3 cm

## 2023-01-19 ENCOUNTER — Encounter (HOSPITAL_COMMUNITY)
Admission: RE | Admit: 2023-01-19 | Discharge: 2023-01-19 | Disposition: A | Discharge status: Home / Self Care | Payer: Medicare PPO | Source: Ambulatory Visit | Attending: Cardiovascular Disease | Admitting: Cardiovascular Disease

## 2023-01-19 DIAGNOSIS — R079 Chest pain, unspecified: Secondary | ICD-10-CM | POA: Diagnosis not present

## 2023-01-19 DIAGNOSIS — Z955 Presence of coronary angioplasty implant and graft: Secondary | ICD-10-CM | POA: Diagnosis not present

## 2023-01-19 NOTE — Progress Notes (Unsigned)
Tiffany Mayo: Tiffany Mayo Date of Birth: [birth year]  Reason for Visit: Follow up History from: Tiffany Mayo, son Tiffany Mayo Primary Neurologist: Pearlean Brownie  ASSESSMENT AND PLAN 82 y.o. 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 Vascular risk factors of hypertension and hyperlipidemia.  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 On 07/21/22 had cardiac cath with 2 stent placement.  March 2024, had 2 back to back episodes of staring with altered awareness and garbled speech.  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  Placed on Keppra.   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 Pearlean Brownie in April 2024 for complex partial seizures.  She was switched to Keppra XR 500 mg daily.  Continued on aspirin and Plavix for stroke prevention.  EEG did not show any seizures. 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 Will repeat CTA head in 6 to 12 months given possible increase in size of left cavernous ICA aneurysm. Tolerating Keppra XR 500 mg daily. Going to cardiac rehab. No more seizure spells. Remains on aspirin and plavix, was going to come off Plavix 21st this month, cardiology  decided to stay on for few months more.  Lives with her husband, he has early dementia. Sometimes has head tremor side to side. Sometimes feels her head vibrating with this.  09/02/22 Dr. Pearlean Brownie: Tiffany Mayo seen today upon request as she had episode of possible seizure while visiting her son in [place] in March 2024.  I reviewed hospital medicine discharge summary from [facility] and [facility].  [place] where she was admitted from 08/20/2022 to 08/21/2022.Marland Kitchen  She was visiting her son and daughter-in-law when all of a sudden she was noted as staring talking incoherently and double forming words and not making sense while talking.  He was brought into the emergency room in the private car.  CT head was negative for acute abnormality MRI scan of the brain was also done which showed no acute changes changes of chronic small vessel disease.  Teleneurology was consulted.  Tiffany Mayo had a second brief episode the next morning while in the hospital and talking to the nurse which was lasting only 5 to 10 minutes.  Given the fact that previously she had an abnormal EEG with sharp.  Episodes were presumed to be She was loaded with Keppra and started on Keppra 750 twice daily.  Tiffany Mayo states she did not tolerate this well it made her quite sleepy and she felt off balance.  She reduced the dose.  375 twice daily which also she is having some trouble tolerating with feeling off balance and eating a lot.  She has had no further episodes of staring speech difficulties since coming back to  [place].  Lab work on 08/21/2018 first included A1c 5.3, LDL cholesterol 65 mg percent.  TSH, B12, folate and CMP and CBC were all normal.  EEG was planned but was not done.  Tiffany Mayo remains on aspirin and Plavix which is tolerating well with minor bruising and no bleeding.  Her blood pressure is usually under good control but today it is slightly elevated at 153/74.  Mains on Lipitor 80 mg which is tolerating well without any side  effects.  Update 07/28/22 SS: Here today for follow up, 04/08/22 3 hour numbness to left fingers, arm, neck, face. Was told TIA. Admitted 07/21/22 for 2 cardiac stent placement. She has done well. Is on aspirin 81 mg and Plavix 75 mg post stent placement, duration per cardiology.  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  Her BP looks good 124/67 on olmesartan, metoprolol, Imdur.  She is on Lipitor 80 mg daily.  Her husband has some health issues.  She is independent and drives.  She looks quite good today.  Does note occasionally a side-to-side head tremor, has been going on for awhile before TIA.  HISTORY  04/27/22 Dr. Pearlean Brownie HPI: Tiffany Mayo is a 82 year old pleasant Caucasian lady seen today for initial office consultation visit for TIA.  History is obtained from the Tiffany Mayo and review of electronic medical records and I personally reviewed pertinent available imaging films in PACS.  She has past medical history of hypertension, hyperlipidemia, hypothyroidism and gastroesophageal reflux disease.  He presented on 04/08/2022 for evaluation for a 3-hour episode of left fingertip gradually spread to involve left arm, , left side of the neck as well as face.  She denies any accompanying slurred speech , mass of the difficulty.  She denied any accompanying headache, slurred speech or gait difficulty.  She fell asleep and next day next day when she woke up in morning the numbness was gone.  She has had no recurrent episodes since then.  She denies any prior history of TIA, stroke or significant neurological problems.  He has no history of migraine headaches.  She admits she has been under significant stress recently since her husband diagnosed with memory problems and possible dementia.  Tiffany Mayo underwent MRI of The Brain which did not show any acute abnormality.  MRI scan cervical spine showed only minor degenerative changes but no  signal cord compression or root or foraminal CT angiogram of the brain and neck did not show large vessel stenosis or occlusion but showed 2 mm terminal left carotid and cavernous carotid aneurysm.  She has no family history of ruptured brain aneurysm he has risk factors of uncontrolled hypertension, smoking known prior aneurysms in the brain.  LDL cholesterol was 138 mg percent and hemoglobin A1c was 5.0.  Tiffany Mayo was commended to take Lipitor 40 mg daily but her primary care physician has changed her to taking it 3 days a week instead now that she is tolerating well without bruising or bleeding..  Blood pressure in the good control today it is 137/64.Marland Kitchen   REVIEW OF SYSTEMS: Out of a complete 14 system review of symptoms, the Tiffany Mayo complains only of the following symptoms, and all other reviewed systems are negative.  See HPI  ALLERGIES: Allergies  Allergen Reactions   Latex Rash   Codeine Nausea And Vomiting   Sulfa Antibiotics Nausea And Vomiting    HOME MEDICATIONS: Outpatient Medications Prior to Visit  Medication Sig  Dispense Refill   acetaminophen (TYLENOL) 500 MG tablet Take 500-1,000 mg by mouth every 6 (six) hours as needed (pain.).     aspirin EC 81 MG tablet Take 1 tablet (81 mg total) by mouth daily. Swallow whole. 30 tablet 11   atorvastatin (LIPITOR) 80 MG tablet Take 80 mg by mouth in the morning.     clopidogrel (PLAVIX) 75 MG tablet Take 1 tablet (75 mg total) by mouth daily. 30 tablet 7   cycloSPORINE (RESTASIS) 0.05 % ophthalmic emulsion Place 1 drop into both eyes 2 (two) times daily.     escitalopram (LEXAPRO) 5 MG tablet Take 5 mg by mouth in the morning.     isosorbide mononitrate (IMDUR) 30 MG 24 hr tablet Take 0.5 tablets (15 mg total) by mouth daily. 15 tablet 7   levothyroxine (SYNTHROID) 50 MCG tablet Take 50 mcg by mouth every other day.     levothyroxine (SYNTHROID) 75 MCG tablet Take 75 mcg by mouth every other day.     nitroGLYCERIN (NITROSTAT) 0.4 MG SL  tablet Place 1 tablet (0.4 mg total) under the tongue every 5 (five) minutes as needed for chest pain. 25 tablet 1   olmesartan (BENICAR) 20 MG tablet Take 20 mg by mouth in the morning.     pantoprazole (PROTONIX) 40 MG tablet Take 1 tablet (40 mg total) by mouth in the morning. 90 tablet 3   levETIRAcetam (KEPPRA XR) 500 MG 24 hr tablet Take 1 tablet (500 mg total) by mouth daily. 30 tablet 5   No facility-administered medications prior to visit.    PAST MEDICAL HISTORY: Past Medical History:  Diagnosis Date   Arthritis    spine, neck   Coronary artery disease    GERD (gastroesophageal reflux disease)    Headache    SINUS    Hemorrhoid    Hyperlipemia    Hypertension    Hypothyroidism    Seizures (HCC)     PAST SURGICAL HISTORY: Past Surgical History:  Procedure Laterality Date   ABDOMINAL HYSTERECTOMY  1990   ANTERIOR CERVICAL DECOMP/DISCECTOMY FUSION  04/03/2012   Procedure: ANTERIOR CERVICAL DECOMPRESSION/DISCECTOMY FUSION 1 LEVEL;  Surgeon: Hewitt Shorts, MD;  Location: [facility]UR[facility] OR[facility];  Service: Neurosurgery;  Laterality: N/A;  Cervical Five-Six Anterior Cervical Decompression with Fusion Plating and Bonegraft   BUNIONECTOMY     RIGHT FOOT   CARDIAC CATHETERIZATION     CORONARY STENT INTERVENTION N/A 07/21/2022   Procedure: CORONARY STENT INTERVENTION;  Surgeon: Tonny Bollman, MD;  Location: [facility] INVASIVE CV LAB;  Service: Cardiovascular;  Laterality: N/A;   EYE SURGERY     Lasik, Catarct   GANGLION CYST EXCISION     BIL WRIST    JOINT REPLACEMENT     Right middle finger   LEFT HEART CATH AND CORONARY ANGIOGRAPHY N/A 07/08/2022   Procedure: LEFT HEART CATH AND CORONARY ANGIOGRAPHY;  Surgeon: Lennette Bihari, MD;  Location: [facility] INVASIVE CV LAB;  Service: Cardiovascular;  Laterality: N/A;   TRIGGER FINGER RELEASE     thumbs    FAMILY HISTORY: Family History  Problem Relation Age of Onset   Heart disease Father     SOCIAL HISTORY: Social History    Socioeconomic History   Marital status: Married    Spouse name: Not on file   Number of children: 2   Years of education: Not on file   Highest education level: Not on file  Occupational History   Occupation: Runner, broadcasting/film/video First Grade - Retired  Tobacco Use   Smoking status: Never   Smokeless tobacco: Never  Substance and Sexual Activity   Alcohol use: Yes    Comment: OCC WINE    Drug use: No   Sexual activity: Not on file  Other Topics Concern   Not on file  Social History Narrative   Not on file   Social Determinants of Health   Financial Resource Strain: Not on file  Food Insecurity: Low Risk  (08/21/2022)   Received from [facility] [place], [facility]   Food Insecurity    Worried About Running Out of Food in the Last Year: Not on file    Within the past 12 months, the food you bought just didn't last and you didn't have money to get more.: Never true  Transportation Needs: Low Risk  (08/21/2022)   Received from [facility], [facility]   Transportation    In the past 12 months, has lack of transportation kept you from medical appointments or from getting medications?: No    Lack of Transportation (Non-Medical): Not on file  Physical Activity: Not on file  Stress: Not on file  Social Connections: Not on file  Intimate Partner Violence: Low Risk  (08/21/2022)   Received from [facility], [facility]   Interpersonal Safety    Feels UN-safe at Home or Work/School: no   PHYSICAL EXAM  Vitals:   01/20/23 1244  BP: 121/68  Pulse: 75  Weight: 138 lb (62.6 kg)  Height: 5\' 3"  (1.6 m)   Body mass index is 24.45 kg/m.  Generalized: Well developed, in no acute distress  Neurological examination  Mentation: Alert oriented to time, place, history taking. Follows all commands speech and language fluent. Well dressed.  Cranial nerve II-XII: Pupils were equal round reactive to light.  Extraocular movements were full, visual field were full on confrontational test. Facial sensation and strength were normal. Head turning and shoulder shrug  were normal and symmetric. Intermittent side to side head tremor notes, is fine/mild Motor: The motor testing reveals 5 over 5 strength of all 4 extremities. Good symmetric motor tone is noted throughout.  Sensory: Sensory testing is intact to soft touch on all 4 extremities. No evidence of extinction is noted.  Coordination: Cerebellar testing reveals good finger-nose-finger and heel-to-shin bilaterally.  Gait and station: Gait is normal.  Reflexes: Deep tendon reflexes are symmetric and normal bilaterally.   DIAGNOSTIC DATA (LABS, IMAGING, TESTING) - I reviewed Tiffany Mayo records, labs, notes, testing and imaging myself where available.  Lab Results  Component Value Date   WBC 5.9 07/22/2022   HGB 11.5 (L) 07/22/2022   HCT 33.6 (L) 07/22/2022   MCV 98.2 07/22/2022   PLT 159 07/22/2022      Component Value Date/Time   NA 140 07/22/2022 0242   NA 140 07/20/2022 1415   K 3.8 07/22/2022 0242   CL 106 07/22/2022 0242   CO2 24 07/22/2022 0242   GLUCOSE 95 07/22/2022 0242   BUN 19 07/22/2022 0242   BUN 20 07/20/2022 1415   CREATININE 1.12 (H) 09/30/2022 1543   CALCIUM 8.7 (L) 07/22/2022 0242   PROT 7.3 04/07/2022 1921   ALBUMIN 4.4 04/07/2022 1921   AST 20 04/07/2022 1921   ALT 14 04/07/2022 1921   ALKPHOS 47 04/07/2022 1921   BILITOT 0.4 04/07/2022 1921   GFRNONAA 55 (L) 07/22/2022 0242   GFRAA >60 08/04/2016 1833  Lab Results  Component Value Date   CHOL 125 11/17/2022   HDL 45 11/17/2022   LDLCALC 62 11/17/2022   TRIG 93 11/17/2022   CHOLHDL 2.8 11/17/2022   Lab Results  Component Value Date   HGBA1C 5.0 04/08/2022   No results found for: "VITAMINB12" Lab Results  Component Value Date   TSH 4.968 (H) 04/08/2022    Margie Ege, AGNP-C, DNP 01/20/2023, 1:24 PM [facility] [address],  [address] [place], [place] [address] [phone]

## 2023-01-20 ENCOUNTER — Ambulatory Visit: Payer: Medicare PPO | Admitting: Neurology

## 2023-01-20 ENCOUNTER — Encounter: Payer: Self-pay | Admitting: Neurology

## 2023-01-20 VITALS — BP 121/68 | HR 75 | Ht 63.0 in | Wt 138.0 lb

## 2023-01-20 DIAGNOSIS — G459 Transient cerebral ischemic attack, unspecified: Secondary | ICD-10-CM | POA: Diagnosis not present

## 2023-01-20 DIAGNOSIS — R569 Unspecified convulsions: Secondary | ICD-10-CM

## 2023-01-20 MED ORDER — LEVETIRACETAM ER 500 MG PO TB24: 500.0000 mg | ORAL_TABLET | Freq: Every day | ORAL | 3 refills | Status: DC

## 2023-01-20 NOTE — Patient Instructions (Signed)
Continue Keppra for seizure prevention  Continue aspirin, plavix for stroke prevention  Strict management of vascular risk factors with a goal BP less than 130/90, A1c less than 7.0, LDL less than 70 for secondary stroke prevention Follow up in 6 months with me

## 2023-01-21 ENCOUNTER — Encounter (HOSPITAL_COMMUNITY)
Admission: RE | Admit: 2023-01-21 | Discharge: 2023-01-21 | Disposition: A | Discharge status: Home / Self Care | Payer: Medicare PPO | Source: Ambulatory Visit | Attending: Cardiovascular Disease | Admitting: Cardiovascular Disease

## 2023-01-21 DIAGNOSIS — Z8679 Personal history of other diseases of the circulatory system: Secondary | ICD-10-CM | POA: Diagnosis not present

## 2023-01-21 DIAGNOSIS — R079 Chest pain, unspecified: Secondary | ICD-10-CM | POA: Diagnosis not present

## 2023-01-21 DIAGNOSIS — K219 Gastro-esophageal reflux disease without esophagitis: Secondary | ICD-10-CM | POA: Diagnosis not present

## 2023-01-21 DIAGNOSIS — Z955 Presence of coronary angioplasty implant and graft: Secondary | ICD-10-CM | POA: Diagnosis not present

## 2023-01-24 ENCOUNTER — Encounter (HOSPITAL_COMMUNITY): Admission: RE | Admit: 2023-01-24 | Payer: Medicare PPO | Source: Ambulatory Visit

## 2023-01-24 DIAGNOSIS — R079 Chest pain, unspecified: Secondary | ICD-10-CM | POA: Diagnosis not present

## 2023-01-24 DIAGNOSIS — Z955 Presence of coronary angioplasty implant and graft: Secondary | ICD-10-CM | POA: Diagnosis not present

## 2023-01-25 NOTE — Progress Notes (Signed)
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 Swallow whole., Disp: 30 tablet, Rfl: 11   atorvastatin (LIPITOR) 80 MG tablet, Take 80 mg by mouth in the morning., Disp: , Rfl:    clopidogrel (PLAVIX) 75 MG tablet, Take 1 tablet (75 mg total) by mouth daily., Disp: 30 tablet, Rfl: 7   cycloSPORINE (RESTASIS) 0.05 % ophthalmic emulsion, Place 1 drop into both eyes 2 (two) times daily., Disp: , Rfl:    escitalopram (LEXAPRO) 5 MG tablet, Take 5 mg by mouth in the morning., Disp: , Rfl:    isosorbide mononitrate (IMDUR) 30 MG 24 hr tablet, Take 0.5 tablets (15 mg total) by mouth daily., Disp: 15 tablet, Rfl: 7   levETIRAcetam (KEPPRA XR) 500 MG 24 hr tablet, Take 1 tablet (500 mg total) by mouth daily., Disp: 90 tablet, Rfl: 3   levothyroxine (SYNTHROID) 50 MCG tablet, Take 50 mcg by mouth every other day., Disp: , Rfl:    levothyroxine (SYNTHROID) 75 MCG tablet, Take 75 mcg by mouth every other day., Disp: , Rfl:    nitroGLYCERIN (NITROSTAT) 0.4 MG SL tablet, Place 1 tablet (0.4 mg total) under the tongue every 5 (five) minutes as needed for chest pain., Disp: 25 tablet, Rfl: 1   olmesartan (BENICAR)  20 MG tablet, Take 20 mg by mouth in the morning., Disp: , Rfl:    pantoprazole (PROTONIX) 40 MG tablet, Take 1 tablet (40 mg total) by mouth in the morning., Disp: 90 tablet, Rfl: 3  Past Medical History: Past Medical History:  Diagnosis Date   Arthritis    spine, neck   Coronary artery disease    GERD (gastroesophageal reflux disease)    Headache    SINUS    Hemorrhoid    Hyperlipemia    Hypertension    Hypothyroidism    Seizures (HCC)     Tobacco Use: Social History   Tobacco Use  Smoking Status Never  Smokeless Tobacco Never    Labs: Review Flowsheet       Latest Ref Rng & Units 03/05/2016 08/04/2016 04/08/2022 11/17/2022  Labs for ITP Cardiac and Pulmonary Rehab  Cholestrol 100 - 199 mg/dL - - 086  578   LDL (calc) 0 - 99 mg/dL - - 469  62   HDL-C >62 mg/dL - - 44  45   Trlycerides 0 - 149 mg/dL - - 952  93   Hemoglobin A1c 4.8 - 5.6 % - - 5.0  -  Bicarbonate 20.0 - 28.0 mmol/L - 24.8  - -  TCO2 0 - 100 mmol/L 28  26  - -  O2 Saturation % - 96.0  - -    Details  Capillary Blood Glucose: Lab Results  Component Value Date   GLUCAP 94 03/05/2016     Exercise Target Goals: Exercise Program Goal: Individual exercise prescription set using results from initial 6 min walk test and THRR while considering  patient's activity barriers and safety.   Exercise Prescription Goal: Initial exercise prescription builds to 30-45 minutes a day of aerobic activity, 2-3 days per week.  Home exercise guidelines will be given to patient during program as part of exercise prescription that the participant will acknowledge.  Activity Barriers & Risk Stratification:  Activity Barriers & Cardiac Risk Stratification - 12/07/22 1538       Activity Barriers & Cardiac Risk Stratification   Activity Barriers Balance Concerns;Joint Problems;History of Falls;Neck/Spine Problems;Other (comment);Back Problems;Arthritis    Comments ? h/o seizures vs TIA    Cardiac Risk  Stratification High             6 Minute Walk:  6 Minute Walk     Row Name 12/07/22 1443         6 Minute Walk   Phase Initial     Distance 1522 feet     Walk Time 6 minutes     # of Rest Breaks 0     MPH 2.9     METS 2.6     RPE 9     Perceived Dyspnea  0     VO2 Peak 9     Symptoms Yes (comment)     Comments Posterior bilateral neck pain 6/10     Resting HR 53 bpm     Resting BP 112/68     Resting Oxygen Saturation  97 %     Exercise Oxygen Saturation  during 6 min walk 97 %     Max Ex. HR 84 bpm     Max Ex. BP 140/70     2 Minute Post BP 124/68              Oxygen Initial Assessment:   Oxygen Re-Evaluation:   Oxygen Discharge (Final Oxygen Re-Evaluation):   Initial Exercise Prescription:  Initial Exercise Prescription - 12/07/22 1500       Date of Initial Exercise RX and Referring Provider   Date 12/07/22    Referring Provider Lennie Odor, Md    Expected Discharge Date 03/02/23      Recumbant Bike   Level 1    RPM 12    Watts 20    Minutes 15    METs 2.6      NuStep   Level 2    SPM 75    Minutes 15    METs 2.6      Prescription Details   Frequency (times per week) 3    Duration Progress to 30 minutes of continuous aerobic without signs/symptoms of physical distress      Intensity   THRR 40-80% of Max Heartrate 55-110    Ratings of Perceived Exertion 11-13    Perceived Dyspnea 0-4      Progression   Progression Continue progressive overload as per policy without signs/symptoms or physical distress.      Resistance Training   Training Prescription Yes    Weight 3 lbs    Reps 10-15             Perform Capillary Blood Glucose checks as needed.  Exercise Prescription Changes:   Exercise Prescription Changes     Row Name 12/13/22 1626 01/03/23 1634 01/17/23 1643  Response to Exercise   Blood Pressure (Admit) 108/62 124/60 148/60     Blood Pressure (Exercise) 142/76 122/68 136/62     Blood Pressure  (Exit) 98/62 109/63 104/64     Heart Rate (Admit) 66 bpm 78 bpm 63 bpm     Heart Rate (Exercise) 82 bpm 114 bpm 87 bpm     Heart Rate (Exit) 65 bpm 60 bpm 58 bpm     Rating of Perceived Exertion (Exercise) 11 11 11      Perceived Dyspnea (Exercise) 0 0 0     Symptoms low bp post, asymptomatic -- --     Comments Pt first day in teh CRP2 program Reviewed MET's, goals and home ExRx Reviewed MET's     Duration Progress to 30 minutes of  aerobic without signs/symptoms of physical distress Progress to 30 minutes of  aerobic without signs/symptoms of physical distress Progress to 30 minutes of  aerobic without signs/symptoms of physical distress     Intensity THRR unchanged THRR unchanged THRR unchanged       Progression   Progression Continue to progress workloads to maintain intensity without signs/symptoms of physical distress. Continue to progress workloads to maintain intensity without signs/symptoms of physical distress. Continue to progress workloads to maintain intensity without signs/symptoms of physical distress.     Average METs 2.2 3.25 3.35       Resistance Training   Training Prescription Yes Yes Yes     Weight 3 lbs 4 lbs wts 4 lbs wts     Reps 10-15 10-15 10-15     Time 10 Minutes 10 Minutes 10 Minutes       Recumbant Bike   Level 1 3 3      RPM 15 54 37     Watts 66 -- 69     Minutes 15 15 15      METs 2.1 3.4 3.6       NuStep   Level 2 3 3      SPM 74 71 87     Minutes 15 15 15      METs 2.6 3.1 3.1       Home Exercise Plan   Plans to continue exercise at -- Home (comment) Home (comment)     Frequency -- Add 2 additional days to program exercise sessions. Add 2 additional days to program exercise sessions.     Initial Home Exercises Provided -- 01/03/23 01/03/23              Exercise Comments:   Exercise Comments     Row Name 12/13/22 1644 01/03/23 1642 01/17/23 1645       Exercise Comments Pt first day in the [facility] program. Pt tolerated exercise well  with an average MET level of 2.2. Pt is learning her THRR, RPE and ExRX and is off to a great start. Reviewed MET's, goals and home ExRx. Pt tolerated exercise well with an average MET level of 3.25. Pt feels good about her goals and is already seeing improvements in her ablility to preform ADL's. She is able to do more and is enjoying kayaking, taking care of [place] and has been able to decrease some mediactions. She is already doing some exercise, but plans to be more regular/structured. She will exercise by walking, kayaking, weights and stretches. for 30-60 mins 1-2 days Reviewed MET's. Pt tolerated exercise well with an average MET level of 3.25. Pt does continue to have chest burning, but is being followed by cardiology for  this issue. Other than that, she feels good about her progress and is doing well              Exercise Goals and Review:   Exercise Goals     Row Name 12/07/22 1521             Exercise Goals   Increase Physical Activity Yes       Intervention Provide advice, education, support and counseling about physical activity/exercise needs.;Develop an individualized exercise prescription for aerobic and resistive training based on initial evaluation findings, risk stratification, comorbidities and participant's personal goals.       Expected Outcomes Short Term: Attend rehab on a regular basis to increase amount of physical activity.;Long Term: Exercising regularly at least 3-5 days a week.;Long Term: Add in home exercise to make exercise part of routine and to increase amount of physical activity.       Increase Strength and Stamina Yes       Intervention Provide advice, education, support and counseling about physical activity/exercise needs.;Develop an individualized exercise prescription for aerobic and resistive training based on initial evaluation findings, risk stratification, comorbidities and participant's personal goals.       Expected Outcomes Short Term:  Increase workloads from initial exercise prescription for resistance, speed, and METs.;Short Term: Perform resistance training exercises routinely during rehab and add in resistance training at home;Long Term: Improve cardiorespiratory fitness, muscular endurance and strength as measured by increased METs and functional capacity ( )       Able to understand and use rate of perceived exertion (RPE) scale Yes       Intervention Provide education and explanation on how to use RPE scale       Expected Outcomes Short Term: Able to use RPE daily in rehab to express subjective intensity level;Long Term:  Able to use RPE to guide intensity level when exercising independently       Knowledge and understanding of Target Heart Rate Range (THRR) Yes       Intervention Provide education and explanation of THRR including how the numbers were predicted and where they are located for reference       Expected Outcomes Short Term: Able to state/look up THRR;Long Term: Able to use THRR to govern intensity when exercising independently;Short Term: Able to use daily as guideline for intensity in rehab       Understanding of Exercise Prescription Yes       Intervention Provide education, explanation, and written materials on patient's individual exercise prescription       Expected Outcomes Short Term: Able to explain program exercise prescription;Long Term: Able to explain home exercise prescription to exercise independently                Exercise Goals Re-Evaluation :  Exercise Goals Re-Evaluation     Row Name 12/13/22 1631 01/03/23 1638           Exercise Goal Re-Evaluation   Exercise Goals Review Increase Physical Activity;Understanding of Exercise Prescription;Increase Strength and Stamina;Knowledge and understanding of Target Heart Rate Range (THRR);Able to understand and use rate of perceived exertion (RPE) scale Increase Physical Activity;Understanding of Exercise Prescription;Increase Strength and  Stamina;Knowledge and understanding of Target Heart Rate Range (THRR);Able to understand and use rate of perceived exertion (RPE) scale      Comments Pt first day in the [facility] program. Pt tolerated exercise well with an average MET level of 2.2. Pt is learning her THRR, RPE and ExRX and is off  to a great start. Reviewed MET's, goals and home ExRx. Pt tolerated exercise well with an average MET level of  3.25. Pt feels good about her goals and is already seeing improvements in her ablility to preform ADL's. She is able to do more and is enjoying kayaking, taking care [place] and has been able to decrease some mediactions. She is already doing some exercise, but plans to be more regular/structured. She will exercise by walking, kayaking, weights and stretches. for 30-60 mins 1-2 days      Expected Outcomes Will continue to monitor pt and progress workloads as tolerated without sign or symptom Will continue to monitor pt and progress workloads as tolerated without sign or symptom               Discharge Exercise Prescription (Final Exercise Prescription Changes):  Exercise Prescription Changes - 01/17/23 1643       Response to Exercise   Blood Pressure (Admit) 148/60    Blood Pressure (Exercise) 136/62    Blood Pressure (Exit) 104/64    Heart Rate (Admit) 63 bpm    Heart Rate (Exercise) 87 bpm    Heart Rate (Exit) 58 bpm    Rating of Perceived Exertion (Exercise) 11    Perceived Dyspnea (Exercise) 0    Comments Reviewed MET's    Duration Progress to 30 minutes of  aerobic without signs/symptoms of physical distress    Intensity THRR unchanged      Progression   Progression Continue to progress workloads to maintain intensity without signs/symptoms of physical distress.    Average METs 3.35      Resistance Training   Training Prescription Yes    Weight 4 lbs wts    Reps 10-15    Time 10 Minutes      Recumbant Bike   Level 3    RPM 37    Watts 69    Minutes 15     METs 3.6      NuStep   Level 3    SPM 87    Minutes 15    METs 3.1      Home Exercise Plan   Plans to continue exercise at Home (comment)    Frequency Add 2 additional days to program exercise sessions.    Initial Home Exercises Provided 01/03/23             Nutrition:  Target Goals: Understanding of nutrition guidelines, daily intake of sodium 1500mg , cholesterol 200mg , calories 30% from fat and 7% or less from saturated fats, daily to have 5 or more servings of fruits and vegetables.  Biometrics:  Pre Biometrics - 12/07/22 1350       Pre Biometrics   Waist Circumference 33 inches    Hip Circumference 40.25 inches    Waist to Hip Ratio 0.82 %    Triceps Skinfold 26 mm    % Body Fat 37.9 %    Grip Strength 18 kg    Flexibility 17 in    Single Leg Stand 6.8 seconds              Nutrition Therapy Plan and Nutrition Goals:  Nutrition Therapy & Goals - 01/12/23 1413       Nutrition Therapy   Diet Heart healthy diet    Drug/Food Interactions Statins/Certain Fruits      Personal Nutrition Goals   Nutrition Goal Patient to identify strategies for reducing cardiovascular risk by attending the [facility] education and nutrition series weekly.  goal in action.   Personal Goal #2 Patient to improve diet quality by using the plate method as a guide for meal planning to include lean protein/plant protein, fruits, vegetables, whole grains, nonfat dairy as part of a well-balanced diet.   goal in action.   Personal Goal #3 Patient to reduce sodium intake to 1500mg  per day   goal in action.   Comments Goals in action. Lashanta continues to attend the [facility] education and nutrition series regularly. Erza reports following a low sugar diet with close attention to saturated fat and sodium intake. 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 She is down 1.8# since starting with our program; BMI remains appropriate. Patient will benefit from participation in intensive  cardiac rehab for nutrition, exercise, and lifestyle modification.      Intervention Plan   Intervention Prescribe, educate and counsel regarding individualized specific dietary modifications aiming towards targeted core components such as weight, hypertension, lipid management, diabetes, heart failure and other comorbidities.;Nutrition handout(s) given to patient.    Expected Outcomes Short Term Goal: Understand basic principles of dietary content, such as calories, fat, sodium, cholesterol and nutrients.;Long Term Goal: Adherence to prescribed nutrition plan.             Nutrition Assessments:  Nutrition Assessments - 12/16/22 1200       Rate Your Plate Scores   Pre Score 68            MEDIFICTS Score Key: ?70 Need to make dietary changes  40-70 Heart Healthy Diet ? 40 Therapeutic Level Cholesterol Diet   Flowsheet Row INTENSIVE CARDIAC REHAB from 12/15/2022 in [facility]  Picture Your Plate Total Score on Admission 68      Picture Your Plate Scores: <16 Unhealthy dietary pattern with much room for improvement. 41-50 Dietary pattern unlikely to meet recommendations for good health and room for improvement. 51-60 More healthful dietary pattern, with some room for improvement.  >60 Healthy dietary pattern, although there may be some specific behaviors that could be improved.    Nutrition Goals Re-Evaluation:  Nutrition Goals Re-Evaluation     Row Name 12/13/22 1548 01/12/23 1413           Goals   Current Weight 141 lb 15.6 oz (64.4 kg) 140 lb 3.4 oz (63.6 kg)      Comment A1c WNL, lipids WNL no new labs; most recent labs  A1c WNL, lipids WNL      Expected Outcome Jahnavi reports following a low sugar diet with close attention to saturated fat and sodium intake. She denies nutrition questions/concerns at this time. 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 Goals in action. Jamylah continues to attend the [facility] education and nutrition series regularly. Ophia reports following a low sugar diet with close attention to saturated fat and sodium intake. 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 She is down 1.8# since starting with our program; BMI remains appropriate. Patient will benefit from participation in intensive cardiac rehab for nutrition, exercise, and lifestyle modification.               Nutrition Goals Re-Evaluation:  Nutrition Goals Re-Evaluation     Row Name 12/13/22 1548 01/12/23 1413           Goals   Current Weight 141 lb 15.6 oz (64.4 kg) 140 lb 3.4 oz (63.6  kg)      Comment A1c WNL, lipids WNL no new labs; most recent labs  A1c WNL, lipids WNL      Expected Outcome Khalis reports following a low sugar diet with close attention to saturated fat and sodium intake. She denies nutrition questions/concerns at this time. 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 Goals in action. Kourtny continues to attend the [facility] education and nutrition series regularly. Mikka reports following a low sugar diet with close attention to saturated fat and sodium intake. 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 She is down 1.8# since starting with our program; BMI remains appropriate. Patient will benefit from participation in intensive cardiac rehab for nutrition, exercise, and lifestyle modification.               Nutrition Goals Discharge (Final Nutrition Goals Re-Evaluation):  Nutrition Goals Re-Evaluation - 01/12/23 1413       Goals   Current Weight 140 lb 3.4 oz (63.6 kg)    Comment no new labs; most recent labs  A1c WNL, lipids WNL    Expected Outcome Goals in action. Marrie continues to attend the [facility] education and  nutrition series regularly. Saadia reports following a low sugar diet with close attention to saturated fat and sodium intake. 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 She is down 1.8# since starting with our program; BMI remains appropriate. Patient will benefit from participation in intensive cardiac rehab for nutrition, exercise, and lifestyle modification.             Psychosocial: Target Goals: Acknowledge presence or absence of significant depression and/or stress, maximize coping skills, provide positive support system. Participant is able to verbalize types and ability to use techniques and skills needed for reducing stress and depression.  Initial Review & Psychosocial Screening:  Initial Psych Review & Screening - 12/07/22 1534       Initial Review   Current issues with Current Sleep Concerns;Current Stress Concerns    Source of Stress Concerns Family    Comments Pt recently diagnosed with Dementia      Family Dynamics   Good Support System? Yes   Pt has son/daughter-in-law, sister, neighbors, church family     Barriers   Psychosocial barriers to participate in program The patient should benefit from training in stress management and relaxation.      Screening Interventions   Interventions Encouraged to exercise             Quality of Life Scores:  Quality of Life - 12/07/22 1550       Quality of Life   Select Quality of Life      Quality of Life Scores   Health/Function Pre 25.47 %    Socioeconomic Pre 26.92 %    Psych/Spiritual Pre 27.43 %    Family Pre 28.8 %    GLOBAL Pre 26.65 %            Scores of 19 and below usually indicate a poorer quality of life in these areas.  A difference of  2-3 points is a clinically meaningful difference.  A difference of 2-3 points in the total score of the Quality of Life Index has been associated with significant improvement in overall quality of life, self-image, physical symptoms, and general health in  studies assessing change in quality of life.  PHQ-9: Review Flowsheet  12/07/2022  Depression screen PHQ 2/9  Decreased Interest 1  Down, Depressed, Hopeless 0  PHQ - 2 Score 1  Altered sleeping 2  Tired, decreased energy 2  Change in appetite 1  Feeling bad or failure about yourself  0  Trouble concentrating 0  Moving slowly or fidgety/restless 0  Suicidal thoughts 0  PHQ-9 Score 6  Difficult doing work/chores Not difficult at all    Details           Interpretation of Total Score  Total Score Depression Severity:  1-4 = Minimal depression, 5-9 = Mild depression, 10-14 = Moderate depression, 15-19 = Moderately severe depression, 20-27 = Severe depression   Psychosocial Evaluation and Intervention:   Psychosocial Re-Evaluation:  Psychosocial Re-Evaluation     Row Name 12/14/22 0915 12/29/22 1457 01/25/23 0754         Psychosocial Re-Evaluation   Current issues with Current Stress Concerns Current Stress Concerns Current Stress Concerns     Comments Husband has early stages of dementia. looking at options for future thinking about moving to a retirment community. 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 Myangel has not voiced any increased concerns or stressors during exercise at cardiac rehab Adah has voiced health concerns regarding her own health as Jaleigha has had some recurring Angina.     Expected Outcomes Pegg will have decreased or controlled stress upon completion of cardiac rehab Pegg will have decreased or controlled stress upon completion of cardiac rehab Pegg will have decreased or controlled stress upon completion of cardiac rehab     Interventions Stress management education;Relaxation education;Encouraged to attend Cardiac Rehabilitation for the exercise Stress management education;Relaxation education;Encouraged to attend Cardiac Rehabilitation for the exercise Stress management education;Relaxation education;Encouraged  to attend Cardiac Rehabilitation for the exercise     Continue Psychosocial Services  Follow up required by staff Follow up required by staff Follow up required by staff       Initial Review   Source of Stress Concerns Family Family Family     Comments Will continue to monitor and offer support as needed Will continue to monitor and offer support as needed Will continue to monitor and offer support as needed              Psychosocial Discharge (Final Psychosocial Re-Evaluation):  Psychosocial Re-Evaluation - 01/25/23 0754       Psychosocial Re-Evaluation   Current issues with Current Stress Concerns    Comments Prosperity has voiced health concerns regarding her own health as Zaylah has had some recurring Angina.    Expected Outcomes Pegg will have decreased or controlled stress upon completion of cardiac rehab    Interventions Stress management education;Relaxation education;Encouraged to attend Cardiac Rehabilitation for the exercise    Continue Psychosocial Services  Follow up required by staff      Initial Review   Source of Stress Concerns Family    Comments Will continue to monitor and offer support as needed             Vocational Rehabilitation: Provide vocational rehab assistance to qualifying candidates.   Vocational Rehab Evaluation & Intervention:  Vocational Rehab - 12/07/22 1537       Initial Vocational Rehab Evaluation & Intervention   Assessment shows need for Vocational Rehabilitation No   Pt is a retired Engineer, site            Education: Education Goals: Education classes will be provided on a weekly  basis, covering required topics. Participant will state understanding/return demonstration of topics presented.    Education     Row Name 12/13/22 1600     Education   Cardiac Education Topics Pritikin   Psychologist, forensic Exercise Education   Exercise Education Biomechanial  Limitations   Instruction Review Code 1- Verbalizes Understanding   Class Start Time 1406   Class Stop Time 1449   Class Time Calculation (min) 43 min    Row Name 12/15/22 1600     Education   Cardiac Education Topics Pritikin   Customer service manager   Weekly Topic Comforting Weekend Breakfasts   Instruction Review Code 1- Verbalizes Understanding   Class Start Time 1400   Class Stop Time 1445   Class Time Calculation (min) 45 min    Row Name 12/20/22 1500     Education   Cardiac Education Topics Pritikin   Licensed conveyancer Nutrition   Nutrition Facts on Fat   Instruction Review Code 1- Verbalizes Understanding   Class Start Time 1400   Class Stop Time 1440   Class Time Calculation (min) 40 min    Row Name 12/22/22 1500     Education   Cardiac Education Topics Pritikin   Customer service manager   Weekly Topic Fast Evening Meals   Instruction Review Code 1- Verbalizes Understanding   Class Start Time 1400   Class Stop Time 1440   Class Time Calculation (min) 40 min    Row Name 12/24/22 1500     Education   Cardiac Education Topics Pritikin     Core Videos   Educator Dietitian   Select Nutrition   Nutrition Vitamins and Minerals   Instruction Review Code 1- Verbalizes Understanding   Class Start Time 1400   Class Stop Time 1445   Class Time Calculation (min) 45 min    Row Name 12/27/22 1600     Education   Cardiac Education Topics Pritikin   Geographical information systems officer Psychosocial   Psychosocial Workshop Healthy Sleep for a Healthy Heart   Instruction Review Code 1- Verbalizes Understanding   Class Start Time 1400   Class Stop Time 1455   Class Time Calculation (min) 55 min    Row Name 12/29/22 1600     Education   Cardiac Education Topics Pritikin   Loss adjuster, chartered   Weekly Topic International Cuisine- Spotlight on the [employer] Zones   Instruction Review Code 1- Verbalizes Understanding   Class Start Time 1400   Class Stop Time 1448   Class Time Calculation (min) 48 min    Row Name 12/31/22 1500     Education   Cardiac Education Topics Pritikin   Psychologist, forensic Exercise Education   Exercise Education Improving Performance   Instruction Review Code 1- Verbalizes Understanding   Class Start Time 1400   Class Stop Time 1455   Class Time Calculation (min) 55 min    Row Name 01/03/23 1500     Education   Cardiac Education  Topics Pritikin   Glass blower/designer Nutrition   Nutrition Workshop Fueling a Forensic psychologist   Instruction Review Code 1- Teaching laboratory technician Start Time 1400   Class Stop Time 1452   Class Time Calculation (min) 52 min    Row Name 01/05/23 1500     Education   Cardiac Education Topics Pritikin   Customer service manager   Weekly Topic Simple Sides and Sauces   Instruction Review Code 1- Verbalizes Understanding   Class Start Time 1400   Class Stop Time 1445   Class Time Calculation (min) 45 min    Row Name 01/12/23 1500     Education   Cardiac Education Topics Pritikin   Customer service manager   Weekly Topic Powerhouse Plant-Based Proteins   Instruction Review Code 1- Verbalizes Understanding   Class Start Time 1400   Class Stop Time 1445   Class Time Calculation (min) 45 min    Row Name 01/14/23 1400     Education   Cardiac Education Topics Pritikin   Hospital doctor Education   General Education Hypertension and Heart Disease   Instruction Review Code 1- Verbalizes Understanding   Class  Start Time 1410   Class Stop Time 1445   Class Time Calculation (min) 35 min    Row Name 01/17/23 1500     Education   Cardiac Education Topics Pritikin   Geographical information systems officer Psychosocial   Psychosocial Workshop From Head to Heart: The Power of a Healthy Outlook   Instruction Review Code 1- Verbalizes Understanding   Class Start Time 1400   Class Stop Time 1446   Class Time Calculation (min) 46 min    Row Name 01/19/23 1500     Education   Cardiac Education Topics Pritikin   Customer service manager   Weekly Topic Adding Flavor - Sodium-Free   Instruction Review Code 1- Verbalizes Understanding   Class Start Time 1400   Class Stop Time 1437   Class Time Calculation (min) 37 min    Row Name 01/21/23 1400     Education   Cardiac Education Topics Pritikin   Hospital doctor Education   General Education Heart Disease Risk Reduction   Instruction Review Code 1- Verbalizes Understanding   Class Start Time 1355   Class Stop Time 1436   Class Time Calculation (min) 41 min    Row Name 01/24/23 1500     Education   Cardiac Education Topics Pritikin   Licensed conveyancer Nutrition   Nutrition Overview of the Pritikin Eating Plan   Instruction Review Code 1- Verbalizes Understanding   Class Start Time 1400   Class Stop Time 1452   Class Time Calculation (min) 52 min            Core Videos: Exercise    Move It!  Clinical staff conducted group or individual video education with verbal and written material and guidebook.  Patient learns the recommended Pritikin exercise program. Exercise with the goal of living a long, healthy life. Some of the health benefits of exercise include controlled diabetes, healthier blood pressure levels, improved cholesterol  levels, improved heart and lung capacity, improved sleep, and better body composition. Everyone should speak with their doctor before starting or changing an exercise routine.  Biomechanical Limitations Clinical staff conducted group or individual video education with verbal and written material and guidebook.  Patient learns how biomechanical limitations can impact exercise and how we can mitigate and possibly overcome limitations to have an impactful and balanced exercise routine.  Body Composition Clinical staff conducted group or individual video education with verbal and written material and guidebook.  Patient learns that body composition (ratio of muscle mass to fat mass) is a key component to assessing overall fitness, rather than body weight alone. Increased fat mass, especially visceral belly fat, can put [place] at increased risk for metabolic syndrome, type 2 diabetes, heart disease, and even death. It is recommended to combine diet and exercise (cardiovascular and resistance training) to improve your body composition. Seek guidance from your physician and exercise physiologist before implementing an exercise routine.  Exercise Action Plan Clinical staff conducted group or individual video education with verbal and written material and guidebook.  Patient learns the recommended strategies to achieve and enjoy long-term exercise adherence, including variety, self-motivation, self-efficacy, and positive decision making. Benefits of exercise include fitness, good health, weight management, more energy, better sleep, less stress, and overall well-being.  Medical   Heart Disease Risk Reduction Clinical staff conducted group or individual video education with verbal and written material and guidebook.  Patient learns our heart is our most vital organ as it circulates oxygen, nutrients, white blood cells, and hormones throughout the entire body, and carries waste away. Data supports a plant-based  eating plan like the Pritikin Program for its effectiveness in slowing progression of and reversing heart disease. The video provides a number of recommendations to address heart disease.   Metabolic Syndrome and Belly Fat  Clinical staff conducted group or individual video education with verbal and written material and guidebook.  Patient learns what metabolic syndrome is, how it leads to heart disease, and how one can reverse it and keep it from coming back. You have metabolic syndrome if you have 3 of the following 5 criteria: abdominal obesity, high blood pressure, high triglycerides, low HDL cholesterol, and high blood sugar.  Hypertension and Heart Disease Clinical staff conducted group or individual video education with verbal and written material and guidebook.  Patient learns that high blood pressure, or hypertension, is very common in the [place]. Hypertension is largely due to excessive salt intake, but other important risk factors include being overweight, physical inactivity, drinking too much alcohol, smoking, and not eating enough potassium from fruits and vegetables. High blood pressure is a leading risk factor for heart attack, stroke, congestive heart failure, dementia, kidney failure, and premature death. Long-term effects of excessive salt intake include stiffening of the arteries and thickening of heart muscle and organ damage. Recommendations include ways to reduce hypertension and the risk of heart disease.  Diseases of Our Time - Focusing on Diabetes Clinical staff conducted group or individual video education with verbal and written material and guidebook.  Patient learns why the best way to stop diseases of our time is prevention, through food and other lifestyle changes. Medicine (such as prescription pills and surgeries) is often only a Band-Aid on the problem, not a  long-term solution. Most common diseases of our time include obesity, type 2 diabetes, hypertension,  heart disease, and cancer. The Pritikin Program is recommended and has been proven to help reduce, reverse, and/or prevent the damaging effects of metabolic syndrome.  Nutrition   Overview of the Pritikin Eating Plan  Clinical staff conducted group or individual video education with verbal and written material and guidebook.  Patient learns about the Pritikin Eating Plan for disease risk reduction. The Pritikin Eating Plan emphasizes a wide variety of unrefined, minimally-processed carbohydrates, like fruits, vegetables, whole grains, and legumes. Go, Caution, and Stop food choices are explained. Plant-based and lean animal proteins are emphasized. Rationale provided for low sodium intake for blood pressure control, low added sugars for blood sugar stabilization, and low added fats and oils for coronary artery disease risk reduction and weight management.  Calorie Density  Clinical staff conducted group or individual video education with verbal and written material and guidebook.  Patient learns about calorie density and how it impacts the Pritikin Eating Plan. Knowing the characteristics of the food you choose will help you decide whether those foods will lead to weight gain or weight loss, and whether you want to consume more or less of them. Weight loss is usually a side effect of the Pritikin Eating Plan because of its focus on low calorie-dense foods.  Label Reading  Clinical staff conducted group or individual video education with verbal and written material and guidebook.  Patient learns about the Pritikin recommended label reading guidelines and corresponding recommendations regarding calorie density, added sugars, sodium content, and whole grains.  Dining Out - Part 1  Clinical staff conducted group or individual video education with verbal and written material and guidebook.  Patient learns that restaurant meals can be sabotaging because they can be so high in calories, fat, sodium, and/or  sugar. Patient learns recommended strategies on how to positively address this and avoid unhealthy pitfalls.  Facts on Fats  Clinical staff conducted group or individual video education with verbal and written material and guidebook.  Patient learns that lifestyle modifications can be just as effective, if not more so, as many medications for lowering your risk of heart disease. A Pritikin lifestyle can help to reduce your risk of inflammation and atherosclerosis (cholesterol build-up, or plaque, in the artery walls). Lifestyle interventions such as dietary choices and physical activity address the cause of atherosclerosis. A review of the types of fats and their impact on blood cholesterol levels, along with dietary recommendations to reduce fat intake is also included.  Nutrition Action Plan  Clinical staff conducted group or individual video education with verbal and written material and guidebook.  Patient learns how to incorporate Pritikin recommendations into their lifestyle. Recommendations include planning and keeping personal health goals in mind as an important part of their success.  Healthy Mind-Set    Healthy Minds, Bodies, Hearts  Clinical staff conducted group or individual video education with verbal and written material and guidebook.  Patient learns how to identify when they are stressed. Video will discuss the impact of that stress, as well as the many benefits of stress management. Patient will also be introduced to stress management techniques. The way we think, act, and feel has an impact on our hearts.  How Our Thoughts Can Heal Our Hearts  Clinical staff conducted group or individual video education with verbal and written material and guidebook.  Patient learns that negative thoughts can cause depression and anxiety. This can result in negative lifestyle  behavior and serious health problems. Cognitive behavioral therapy is an effective method to help control our thoughts in  order to change and improve our emotional outlook.  Additional Videos:  Exercise    Improving Performance  Clinical staff conducted group or individual video education with verbal and written material and guidebook.  Patient learns to use a non-linear approach by alternating intensity levels and lengths of time spent exercising to help burn more calories and lose more body fat. Cardiovascular exercise helps improve heart health, metabolism, hormonal balance, blood sugar control, and recovery from fatigue. Resistance training improves strength, endurance, balance, coordination, reaction time, metabolism, and muscle mass. Flexibility exercise improves circulation, posture, and balance. Seek guidance from your physician and exercise physiologist before implementing an exercise routine and learn your capabilities and proper form for all exercise.  Introduction to Yoga  Clinical staff conducted group or individual video education with verbal and written material and guidebook.  Patient learns about yoga, a discipline of the coming together of mind, breath, and body. The benefits of yoga include improved flexibility, improved range of motion, better posture and core strength, increased lung function, weight loss, and positive self-image. Yoga's heart health benefits include lowered blood pressure, healthier heart rate, decreased cholesterol and triglyceride levels, improved immune function, and reduced stress. Seek guidance from your physician and exercise physiologist before implementing an exercise routine and learn your capabilities and proper form for all exercise.  Medical   Aging: Enhancing Your Quality of Life  Clinical staff conducted group or individual video education with verbal and written material and guidebook.  Patient learns key strategies and recommendations to stay in good physical health and enhance quality of life, such as prevention strategies, having an advocate, securing a Health  Care Proxy and Power of Attorney, and keeping a list of medications and system for tracking them. It also discusses how to avoid risk for bone loss.  Biology of Weight Control  Clinical staff conducted group or individual video education with verbal and written material and guidebook.  Patient learns that weight gain occurs because we consume more calories than we burn (eating more, moving less). Even if your body weight is normal, you may have higher ratios of fat compared to muscle mass. Too much body fat puts you at increased risk for cardiovascular disease, heart attack, stroke, type 2 diabetes, and obesity-related cancers. In addition to exercise, following the Pritikin Eating Plan can help reduce your risk.  Decoding Lab Results  Clinical staff conducted group or individual video education with verbal and written material and guidebook.  Patient learns that lab test reflects one measurement whose values change over time and are influenced by many factors, including medication, stress, sleep, exercise, food, hydration, pre-existing medical conditions, and more. It is recommended to use the knowledge from this video to become more involved with your lab results and evaluate your numbers to speak with your doctor.   Diseases of Our Time - Overview  Clinical staff conducted group or individual video education with verbal and written material and guidebook.  Patient learns that according to the CDC, 50% to 70% of chronic diseases (such as obesity, type 2 diabetes, elevated lipids, hypertension, and heart disease) are avoidable through lifestyle improvements including healthier food choices, listening to satiety cues, and increased physical activity.  Sleep Disorders Clinical staff conducted group or individual video education with verbal and written material and guidebook.  Patient learns how good quality and duration of sleep are important to overall health and  well-being. Patient also learns  about sleep disorders and how they impact health along with recommendations to address them, including discussing with a physician.  Nutrition  Dining Out - Part 2 Clinical staff conducted group or individual video education with verbal and written material and guidebook.  Patient learns how to plan ahead and communicate in order to maximize their dining experience in a healthy and nutritious manner. Included are recommended food choices based on the type of restaurant the patient is visiting.   Fueling a Banker conducted group or individual video education with verbal and written material and guidebook.  There is a strong connection between our food choices and our health. Diseases like obesity and type 2 diabetes are very prevalent and are in large-part due to lifestyle choices. The Pritikin Eating Plan provides plenty of food and hunger-curbing satisfaction. It is easy to follow, affordable, and helps reduce health risks.  Menu Workshop  Clinical staff conducted group or individual video education with verbal and written material and guidebook.  Patient learns that restaurant meals can sabotage health goals because they are often packed with calories, fat, sodium, and sugar. Recommendations include strategies to plan ahead and to communicate with the manager, chef, or server to help order a healthier meal.  Planning Your Eating Strategy  Clinical staff conducted group or individual video education with verbal and written material and guidebook.  Patient learns about the Pritikin Eating Plan and its benefit of reducing the risk of disease. The Pritikin Eating Plan does not focus on calories. Instead, it emphasizes high-quality, nutrient-rich foods. By knowing the characteristics of the foods, we choose, we can determine their calorie density and make informed decisions.  Targeting Your Nutrition Priorities  Clinical staff conducted group or individual video education with  verbal and written material and guidebook.  Patient learns that lifestyle habits have a tremendous impact on disease risk and progression. This video provides eating and physical activity recommendations based on your personal health goals, such as reducing LDL cholesterol, losing weight, preventing or controlling type 2 diabetes, and reducing high blood pressure.  Vitamins and Minerals  Clinical staff conducted group or individual video education with verbal and written material and guidebook.  Patient learns different ways to obtain key vitamins and minerals, including through a recommended healthy diet. It is important to discuss all supplements you take with your doctor.   Healthy Mind-Set    Smoking Cessation  Clinical staff conducted group or individual video education with verbal and written material and guidebook.  Patient learns that cigarette smoking and tobacco addiction pose a serious health risk which affects millions of people. Stopping smoking will significantly reduce the risk of heart disease, lung disease, and many forms of cancer. Recommended strategies for quitting are covered, including working with your doctor to develop a successful plan.  Culinary   Becoming a Set designer conducted group or individual video education with verbal and written material and guidebook.  Patient learns that cooking at home can be healthy, cost-effective, quick, and puts them in control. Keys to cooking healthy recipes will include looking at your recipe, assessing your equipment needs, planning ahead, making it simple, choosing cost-effective seasonal ingredients, and limiting the use of added fats, salts, and sugars.  Cooking - Breakfast and Snacks  Clinical staff conducted group or individual video education with verbal and written material and guidebook.  Patient learns how important breakfast is to satiety and nutrition through the entire day. Recommendations  include key  foods to eat during breakfast to help stabilize blood sugar levels and to prevent overeating at meals later in the day. Planning ahead is also a key component.  Cooking - Educational psychologist conducted group or individual video education with verbal and written material and guidebook.  Patient learns eating strategies to improve overall health, including an approach to cook more at home. Recommendations include thinking of animal protein as a side on your plate rather than center stage and focusing instead on lower calorie dense options like vegetables, fruits, whole grains, and plant-based proteins, such as beans. Making sauces in large quantities to freeze for later and leaving the skin on your vegetables are also recommended to maximize your experience.  Cooking - Healthy Salads and Dressing Clinical staff conducted group or individual video education with verbal and written material and guidebook.  Patient learns that vegetables, fruits, whole grains, and legumes are the foundations of the Pritikin Eating Plan. Recommendations include how to incorporate each of these in flavorful and healthy salads, and how to create homemade salad dressings. Proper handling of ingredients is also covered. Cooking - Soups and [employer] - Soups and Desserts Clinical staff conducted group or individual video education with verbal and written material and guidebook.  Patient learns that Pritikin soups and desserts make for easy, nutritious, and delicious snacks and meal components that are low in sodium, fat, sugar, and calorie density, while high in vitamins, minerals, and filling fiber. Recommendations include simple and healthy ideas for soups and desserts.   Overview     The Pritikin Solution Program Overview Clinical staff conducted group or individual video education with verbal and written material and guidebook.  Patient learns that the results of the Pritikin Program have been  documented in more than 100 articles published in peer-reviewed journals, and the benefits include reducing risk factors for (and, in some cases, even reversing) high cholesterol, high blood pressure, type 2 diabetes, obesity, and more! An overview of the three key pillars of the Pritikin Program will be covered: eating well, doing regular exercise, and having a healthy mind-set.  WORKSHOPS  Exercise: Exercise Basics: Building Your Action Plan Clinical staff led group instruction and group discussion with PowerPoint presentation and patient guidebook. To enhance the learning environment the use of posters, models and videos may be added. At the conclusion of this workshop, patients will comprehend the difference between physical activity and exercise, as well as the benefits of incorporating both, into their routine. Patients will understand the FITT (Frequency, Intensity, Time, and Type) principle and how to use it to build an exercise action plan. In addition, safety concerns and other considerations for exercise and cardiac rehab will be addressed by the presenter. The purpose of this lesson is to promote a comprehensive and effective weekly exercise routine in order to improve patients' overall level of fitness.   Managing Heart Disease: Your Path to a Healthier Heart Clinical staff led group instruction and group discussion with PowerPoint presentation and patient guidebook. To enhance the learning environment the use of posters, models and videos may be added.At the conclusion of this workshop, patients will understand the anatomy and physiology of the heart. Additionally, they will understand how Pritikin's three pillars impact the risk factors, the progression, and the management of heart disease.  The purpose of this lesson is to provide a high-level overview of the heart, heart disease, and how the Pritikin lifestyle positively impacts risk factors.  Exercise Biomechanics  Clinical  staff led group instruction and group discussion with PowerPoint presentation and patient guidebook. To enhance the learning environment the use of posters, models and videos may be added. Patients will learn how the structural parts of their bodies function and how these functions impact their daily activities, movement, and exercise. Patients will learn how to promote a neutral spine, learn how to manage pain, and identify ways to improve their physical movement in order to promote healthy living. The purpose of this lesson is to expose patients to common physical limitations that impact physical activity. Participants will learn practical ways to adapt and manage aches and pains, and to minimize their effect on regular exercise. Patients will learn how to maintain good posture while sitting, walking, and lifting.  Balance Training and Fall Prevention  Clinical staff led group instruction and group discussion with PowerPoint presentation and patient guidebook. To enhance the learning environment the use of posters, models and videos may be added. At the conclusion of this workshop, patients will understand the importance of their sensorimotor skills (vision, proprioception, and the vestibular system) in maintaining their ability to balance as they age. Patients will apply a variety of balancing exercises that are appropriate for their current level of function. Patients will understand the common causes for poor balance, possible solutions to these problems, and ways to modify their physical environment in order to minimize their fall risk. The purpose of this lesson is to teach patients about the importance of maintaining balance as they age and ways to minimize their risk of falling.  WORKSHOPS   Nutrition:  Fueling a Ship broker led group instruction and group discussion with PowerPoint presentation and patient guidebook. To enhance the learning environment the use of  posters, models and videos may be added. Patients will review the foundational principles of the Pritikin Eating Plan and understand what constitutes a serving size in each of the food groups. Patients will also learn Pritikin-friendly foods that are better choices when away from home and review make-ahead meal and snack options. Calorie density will be reviewed and applied to three nutrition priorities: weight maintenance, weight loss, and weight gain. The purpose of this lesson is to reinforce (in a group setting) the key concepts around what patients are recommended to eat and how to apply these guidelines when away from home by planning and selecting Pritikin-friendly options. Patients will understand how calorie density may be adjusted for different weight management goals.  Mindful Eating  Clinical staff led group instruction and group discussion with PowerPoint presentation and patient guidebook. To enhance the learning environment the use of posters, models and videos may be added. Patients will briefly review the concepts of the Pritikin Eating Plan and the importance of low-calorie dense foods. The concept of mindful eating will be introduced as well as the importance of paying attention to internal hunger signals. Triggers for non-hunger eating and techniques for dealing with triggers will be explored. The purpose of this lesson is to provide patients with the opportunity to review the basic principles of the Pritikin Eating Plan, discuss the value of eating mindfully and how to measure internal cues of hunger and fullness using the Hunger Scale. Patients will also discuss reasons for non-hunger eating and learn strategies to use for controlling emotional eating.  Targeting Your Nutrition Priorities Clinical staff led group instruction and group discussion with PowerPoint presentation and patient guidebook. To enhance the learning environment the use of posters, models and videos may be added.  Patients will learn how to determine their genetic susceptibility to disease by reviewing their family history. Patients will gain insight into the importance of diet as part of an overall healthy lifestyle in mitigating the impact of genetics and other environmental insults. The purpose of this lesson is to provide patients with the opportunity to assess their personal nutrition priorities by looking at their family history, their own health history and current risk factors. Patients will also be able to discuss ways of prioritizing and modifying the Pritikin Eating Plan for their highest risk areas  Menu  Clinical staff led group instruction and group discussion with PowerPoint presentation and patient guidebook. To enhance the learning environment the use of posters, models and videos may be added. Using menus brought in from [employer], or printed from [employer], patients will apply the Pritikin dining out guidelines that were presented in the [employer] video. Patients will also be able to practice these guidelines in a variety of provided scenarios. The purpose of this lesson is to provide patients with the opportunity to practice hands-on learning of the Pritikin Dining Out guidelines with actual menus and practice scenarios.  Label Reading Clinical staff led group instruction and group discussion with PowerPoint presentation and patient guidebook. To enhance the learning environment the use of posters, models and videos may be added. Patients will review and discuss the Pritikin label reading guidelines presented in Pritikin's Label Reading Educational series video. Using fool labels brought in from local grocery stores and markets, patients will apply the label reading guidelines and determine if the packaged food meet the Pritikin guidelines. The purpose of this lesson is to provide patients with the opportunity to review, discuss, and practice hands-on learning of the  Pritikin Label Reading guidelines with actual packaged food labels. Cooking School  Pritikin's [employer] are designed to teach patients ways to prepare quick, simple, and affordable recipes at home. The importance of nutrition's role in chronic disease risk reduction is reflected in its emphasis in the overall Pritikin program. By learning how to prepare essential core Pritikin Eating Plan recipes, patients will increase control over what they eat; be able to customize the flavor of foods without the use of added salt, sugar, or fat; and improve the quality of the food they consume. By learning a set of core recipes which are easily assembled, quickly prepared, and affordable, patients are more likely to prepare more healthy foods at home. These workshops focus on convenient breakfasts, simple entres, side dishes, and desserts which can be prepared with minimal effort and are consistent with nutrition recommendations for cardiovascular risk reduction. Cooking [employer] are taught by a Armed forces logistics/support/administrative officer (RD) who has been trained by the [employer]. The chef or RD has a clear understanding of the importance of minimizing - if not completely eliminating - added fat, sugar, and sodium in recipes. Throughout the series of Cooking School Workshop sessions, patients will learn about healthy ingredients and efficient methods of cooking to build confidence in their capability to prepare    Cooking School weekly topics:  Adding Flavor- Sodium-Free  Fast and Healthy Breakfasts  Powerhouse Plant-Based Proteins  Satisfying Salads and Dressings  Simple Sides and Sauces  International Cuisine-Spotlight on the [employer] Zones  Delicious Desserts  Savory Soups  [employer] - Meals in a Astronomer Appetizers and Snacks  Comforting Weekend Breakfasts  One-Pot Wonders   Fast Evening Meals  Landscape architect Your  Pritikin Plate  WORKSHOPS    Healthy Mindset (Psychosocial):  Focused Goals, Sustainable Changes Clinical staff led group instruction and group discussion with PowerPoint presentation and patient guidebook. To enhance the learning environment the use of posters, models and videos may be added. Patients will be able to apply effective goal setting strategies to establish at least one personal goal, and then take consistent, meaningful action toward that goal. They will learn to identify common barriers to achieving personal goals and develop strategies to overcome them. Patients will also gain an understanding of how our mind-set can impact our ability to achieve goals and the importance of cultivating a positive and growth-oriented mind-set. The purpose of this lesson is to provide patients with a deeper understanding of how to set and achieve personal goals, as well as the tools and strategies needed to overcome common obstacles which may arise along the way.  From Head to Heart: The Power of a Healthy Outlook  Clinical staff led group instruction and group discussion with PowerPoint presentation and patient guidebook. To enhance the learning environment the use of posters, models and videos may be added. Patients will be able to recognize and describe the impact of emotions and mood on physical health. They will discover the importance of self-care and explore self-care practices which may work for them. Patients will also learn how to utilize the 4 C's to cultivate a healthier outlook and better manage stress and challenges. The purpose of this lesson is to demonstrate to patients how a healthy outlook is an essential part of maintaining good health, especially as they continue their cardiac rehab journey.  Healthy Sleep for a Healthy Heart Clinical staff led group instruction and group discussion with PowerPoint presentation and patient guidebook. To enhance the learning environment the use of posters, models and videos may be  added. At the conclusion of this workshop, patients will be able to demonstrate knowledge of the importance of sleep to overall health, well-being, and quality of life. They will understand the symptoms of, and treatments for, common sleep disorders. Patients will also be able to identify daytime and nighttime behaviors which impact sleep, and they will be able to apply these tools to help manage sleep-related challenges. The purpose of this lesson is to provide patients with a general overview of sleep and outline the importance of quality sleep. Patients will learn about a few of the most common sleep disorders. Patients will also be introduced to the concept of "sleep hygiene," and discover ways to self-manage certain sleeping problems through simple daily behavior changes. Finally, the workshop will motivate patients by clarifying the links between quality sleep and their goals of heart-healthy living.   Recognizing and Reducing Stress Clinical staff led group instruction and group discussion with PowerPoint presentation and patient guidebook. To enhance the learning environment the use of posters, models and videos may be added. At the conclusion of this workshop, patients will be able to understand the types of stress reactions, differentiate between acute and chronic stress, and recognize the impact that chronic stress has on their health. They will also be able to apply different coping mechanisms, such as reframing negative self-talk. Patients will have the opportunity to practice a variety of stress management techniques, such as deep abdominal breathing, progressive muscle relaxation, and/or guided imagery.  The purpose of this lesson is to educate patients on the role of stress in their lives and to provide healthy techniques for coping with it.  Learning Barriers/Preferences:  Learning Barriers/Preferences -  12/07/22 1536       Learning Barriers/Preferences   Learning Barriers Sight   wears  glasses   Learning Preferences Audio;Computer/Internet;Group Instruction;Individual Instruction;Pictoral;Skilled Demonstration;Verbal Instruction;Video;Written Material             Education Topics:  Knowledge Questionnaire Score:  Knowledge Questionnaire Score - 12/07/22 1536       Knowledge Questionnaire Score   Pre Score 21/23             Core Components/Risk Factors/Patient Goals at Admission:  Personal Goals and Risk Factors at Admission - 12/07/22 1538       Core Components/Risk Factors/Patient Goals on Admission   Hypertension Yes    Intervention Provide education on lifestyle modifcations including regular physical activity/exercise, weight management, moderate sodium restriction and increased consumption of fresh fruit, vegetables, and low fat dairy, alcohol moderation, and smoking cessation.;Monitor prescription use compliance.    Expected Outcomes Short Term: Continued assessment and intervention until BP is < 140/11mm HG in hypertensive participants. < 130/64mm HG in hypertensive participants with diabetes, heart failure or chronic kidney disease.;Long Term: Maintenance of blood pressure at goal levels.    Lipids Yes    Intervention Provide education and support for participant on nutrition & aerobic/resistive exercise along with prescribed medications to achieve LDL 70mg , HDL >40mg .    Expected Outcomes Short Term: Participant states understanding of desired cholesterol values and is compliant with medications prescribed. Participant is following exercise prescription and nutrition guidelines.;Long Term: Cholesterol controlled with medications as prescribed, with individualized exercise RX and with personalized nutrition plan. Value goals: LDL < 70mg , HDL > 40 mg.    Stress Yes    Intervention Offer individual and/or small group education and counseling on adjustment to heart disease, stress management and health-related lifestyle change. Teach and support self-help  strategies.;Refer participants experiencing significant psychosocial distress to appropriate mental health specialists for further evaluation and treatment. When possible, include family members and significant others in education/counseling sessions.    Expected Outcomes Short Term: Participant demonstrates changes in health-related behavior, relaxation and other stress management skills, ability to obtain effective social support, and compliance with psychotropic medications if prescribed.;Long Term: Emotional wellbeing is indicated by absence of clinically significant psychosocial distress or social isolation.             Core Components/Risk Factors/Patient Goals Review:   Goals and Risk Factor Review     Row Name 12/14/22 2952 12/29/22 1459 01/25/23 0758         Core Components/Risk Factors/Patient Goals Review   Personal Goals Review Weight Management/Obesity;Stress;Hypertension;Lipids Weight Management/Obesity;Stress;Hypertension;Lipids Weight Management/Obesity;Stress;Hypertension;Lipids     Review Tiarra started cardiac rehab on 12/13/22 and did well with exercise. Vital signs were stable Aliviyah started cardiac rehab on 12/13/22 and has been doing well with exercise. Vital signs remain stable. No further reports of feeling lightheaded since Imdur placed on hold Yen started cardiac rehab on 12/13/22 and has been doing well with exercise. Vital signs remain stable. Onnie's Imdur was restarted as Tzippy had some reoccurring chest pressure. Zakira reports having a mild intermittent headache.     Expected Outcomes Jolea will continue to participate in intensive cardiac rehab for exercise, nutrition and lifestyle modifications Mischa will continue to participate in intensive cardiac rehab for exercise, nutrition and lifestyle modifications Isabell will continue to participate in intensive cardiac rehab for exercise, nutrition and lifestyle modifications              Core Components/Risk  Factors/Patient Goals at Discharge (Final Review):  Goals and Risk Factor Review - 01/25/23 0758       Core Components/Risk Factors/Patient Goals Review   Personal Goals Review Weight Management/Obesity;Stress;Hypertension;Lipids    Review Chardai started cardiac rehab on 12/13/22 and has been doing well with exercise. Vital signs remain stable. Maygen's Imdur was restarted as Likisha had some reoccurring chest pressure. Tekela reports having a mild intermittent headache.    Expected Outcomes Jakyria will continue to participate in intensive cardiac rehab for exercise, nutrition and lifestyle modifications             ITP Comments:  ITP Comments     Row Name 12/07/22 1519 12/14/22 0912 12/29/22 1456 01/25/23 0751     ITP Comments Armanda Magic, MD: Medical Director.  Introduction to the [employer] / Intensive Cardiac Rehab.  Initial orientation packet reviewed with the patient. 30 Day ITP Review. Kaidance started cardiac rehab on 12/13/22 and did well with exercise. 30 Day ITP Review. Aleeyah is doing well with exercise at  cardiac rehab. Daniyla has not reported having any more lightheadedness since Imdur placed on hold 30 Day ITP Review. Shavondra has good attendance and participation with exercise at  cardiac rehab. Suetta's Imdur was restarted             Comments: See ITP comments.Thayer Headings RN BSN

## 2023-01-26 ENCOUNTER — Encounter (HOSPITAL_COMMUNITY)
Admission: RE | Admit: 2023-01-26 | Discharge: 2023-01-26 | Disposition: A | Discharge status: Home / Self Care | Payer: Medicare PPO | Source: Ambulatory Visit | Attending: Cardiovascular Disease | Admitting: Cardiovascular Disease

## 2023-01-26 DIAGNOSIS — R079 Chest pain, unspecified: Secondary | ICD-10-CM | POA: Diagnosis not present

## 2023-01-26 DIAGNOSIS — Z955 Presence of coronary angioplasty implant and graft: Secondary | ICD-10-CM

## 2023-01-28 ENCOUNTER — Encounter (HOSPITAL_COMMUNITY)
Admission: RE | Admit: 2023-01-28 | Discharge: 2023-01-28 | Disposition: A | Discharge status: Home / Self Care | Payer: Medicare PPO | Source: Ambulatory Visit | Attending: Cardiovascular Disease | Admitting: Cardiovascular Disease

## 2023-01-28 DIAGNOSIS — R079 Chest pain, unspecified: Secondary | ICD-10-CM | POA: Diagnosis not present

## 2023-01-28 DIAGNOSIS — Z955 Presence of coronary angioplasty implant and graft: Secondary | ICD-10-CM

## 2023-02-02 ENCOUNTER — Other Ambulatory Visit (HOSPITAL_COMMUNITY): Payer: Medicare PPO

## 2023-02-02 ENCOUNTER — Encounter (HOSPITAL_COMMUNITY)
Admission: RE | Admit: 2023-02-02 | Discharge: 2023-02-02 | Disposition: A | Discharge status: Home / Self Care | Payer: Medicare PPO | Source: Ambulatory Visit | Attending: Cardiovascular Disease | Admitting: Cardiovascular Disease

## 2023-02-02 DIAGNOSIS — Z955 Presence of coronary angioplasty implant and graft: Secondary | ICD-10-CM | POA: Insufficient documentation

## 2023-02-02 DIAGNOSIS — Z48812 Encounter for surgical aftercare following surgery on the circulatory system: Secondary | ICD-10-CM | POA: Insufficient documentation

## 2023-02-02 DIAGNOSIS — R079 Chest pain, unspecified: Secondary | ICD-10-CM | POA: Diagnosis not present

## 2023-02-04 ENCOUNTER — Encounter (HOSPITAL_COMMUNITY)
Admission: RE | Admit: 2023-02-04 | Discharge: 2023-02-04 | Disposition: A | Discharge status: Home / Self Care | Payer: Medicare PPO | Source: Ambulatory Visit | Attending: Cardiovascular Disease | Admitting: Cardiovascular Disease

## 2023-02-04 DIAGNOSIS — Z48812 Encounter for surgical aftercare following surgery on the circulatory system: Secondary | ICD-10-CM | POA: Diagnosis not present

## 2023-02-04 DIAGNOSIS — R079 Chest pain, unspecified: Secondary | ICD-10-CM | POA: Diagnosis not present

## 2023-02-04 DIAGNOSIS — Z955 Presence of coronary angioplasty implant and graft: Secondary | ICD-10-CM

## 2023-02-07 ENCOUNTER — Encounter (HOSPITAL_COMMUNITY)
Admission: RE | Admit: 2023-02-07 | Discharge: 2023-02-07 | Disposition: A | Discharge status: Home / Self Care | Payer: Medicare PPO | Source: Ambulatory Visit | Attending: Cardiovascular Disease | Admitting: Cardiovascular Disease

## 2023-02-07 DIAGNOSIS — Z955 Presence of coronary angioplasty implant and graft: Secondary | ICD-10-CM

## 2023-02-07 DIAGNOSIS — Z48812 Encounter for surgical aftercare following surgery on the circulatory system: Secondary | ICD-10-CM | POA: Diagnosis not present

## 2023-02-07 DIAGNOSIS — R079 Chest pain, unspecified: Secondary | ICD-10-CM | POA: Diagnosis not present

## 2023-02-07 NOTE — Progress Notes (Signed)
Shelah reported having chest burning/ pressure on the nustep that lasted about 6 minutes. Chana says the pressure lasted about 6 minutes. Medications reviewed. Jackelyne says she is taking as prescribed.  Anzleigh says she has the pressure mostly when she exercises at cardiac rehab. Blood pressure 140/70. Resting heart rate 57. Oxygen saturation97% on room air. Telemetry rhythm Sinus brady. Will notify onsite provider Joni Reining DNP. Onsite provider Joni Reining DNP notified. Kitiara says that her chest pressure resolved. Discussed with Joni Reining DNP and shown today's ECG tracings. Joni Reining DNP said to hold further exercise for now. Appointment obtained for Keshawn to see [facility] on 02/23/23. Moranda was also placed on wait list to get a possible earlier appointment. Will cancel appointments for the rest of the week. Patient states understanding and knows to call Dr O'Neil's  office if her chest pressure returns and does not resolve or to go the ED for treatment. Patient states understanding and left cardiac rehab without complaints or symptoms.Thayer Headings RN BSN

## 2023-02-09 ENCOUNTER — Encounter (HOSPITAL_COMMUNITY): Payer: Medicare PPO

## 2023-02-11 ENCOUNTER — Encounter (HOSPITAL_COMMUNITY): Payer: Medicare PPO

## 2023-02-11 ENCOUNTER — Telehealth (HOSPITAL_COMMUNITY): Payer: Self-pay

## 2023-02-11 NOTE — Telephone Encounter (Signed)
[name] called and will not be in today as she has not heard back from the doctor. She stated "Tiffany Mayo will know what is going on." I did cancel her appointment for today!

## 2023-02-14 ENCOUNTER — Encounter (HOSPITAL_COMMUNITY): Payer: Medicare PPO

## 2023-02-15 ENCOUNTER — Ambulatory Visit: Payer: Medicare PPO | Admitting: Neurology

## 2023-02-16 ENCOUNTER — Encounter (HOSPITAL_COMMUNITY): Payer: Medicare PPO

## 2023-02-18 ENCOUNTER — Encounter (HOSPITAL_COMMUNITY): Payer: Medicare PPO

## 2023-02-21 ENCOUNTER — Encounter (HOSPITAL_COMMUNITY): Payer: Medicare PPO

## 2023-02-23 ENCOUNTER — Encounter (HOSPITAL_COMMUNITY): Payer: Medicare PPO

## 2023-02-23 ENCOUNTER — Encounter: Payer: Self-pay | Admitting: Physician Assistant

## 2023-02-23 ENCOUNTER — Ambulatory Visit: Payer: Medicare PPO | Attending: Physician Assistant | Admitting: Physician Assistant

## 2023-02-23 VITALS — BP 120/56 | HR 51 | Ht 61.5 in | Wt 138.0 lb

## 2023-02-23 DIAGNOSIS — G459 Transient cerebral ischemic attack, unspecified: Secondary | ICD-10-CM

## 2023-02-23 DIAGNOSIS — R079 Chest pain, unspecified: Secondary | ICD-10-CM

## 2023-02-23 DIAGNOSIS — R001 Bradycardia, unspecified: Secondary | ICD-10-CM

## 2023-02-23 DIAGNOSIS — E785 Hyperlipidemia, unspecified: Secondary | ICD-10-CM | POA: Diagnosis not present

## 2023-02-23 DIAGNOSIS — I25119 Atherosclerotic heart disease of native coronary artery with unspecified angina pectoris: Secondary | ICD-10-CM | POA: Diagnosis not present

## 2023-02-23 DIAGNOSIS — I2510 Atherosclerotic heart disease of native coronary artery without angina pectoris: Secondary | ICD-10-CM

## 2023-02-23 DIAGNOSIS — I10 Essential (primary) hypertension: Secondary | ICD-10-CM

## 2023-02-23 MED ORDER — ISOSORBIDE MONONITRATE ER 30 MG PO TB24: 30.0000 mg | ORAL_TABLET | Freq: Every day | ORAL | 3 refills | Status: DC

## 2023-02-23 MED ORDER — OLMESARTAN MEDOXOMIL 20 MG PO TABS: 10.0000 mg | ORAL_TABLET | Freq: Every day | ORAL | 5 refills | Status: DC

## 2023-02-23 NOTE — Progress Notes (Signed)
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 Swallow whole., Disp: 30 tablet, Rfl: 11   atorvastatin (LIPITOR) 80 MG tablet, Take 80 mg by mouth in the morning., Disp: , Rfl:    clopidogrel (PLAVIX) 75 MG tablet, Take 1 tablet (75 mg total) by mouth daily., Disp: 30 tablet, Rfl: 7   cycloSPORINE (RESTASIS) 0.05 % ophthalmic emulsion, Place 1 drop into both eyes 2 (two) times daily., Disp: , Rfl:    escitalopram (LEXAPRO) 5 MG tablet, Take 5 mg by mouth in the morning., Disp: , Rfl:    isosorbide mononitrate (IMDUR) 30 MG 24 hr tablet, Take 0.5 tablets (15 mg total) by mouth daily., Disp: 15 tablet, Rfl: 7   levETIRAcetam (KEPPRA XR) 500 MG 24 hr tablet, Take 1 tablet (500 mg total) by mouth daily., Disp: 90 tablet, Rfl: 3   levothyroxine (SYNTHROID) 50 MCG tablet, Take 50 mcg by mouth every other day., Disp: , Rfl:    levothyroxine (SYNTHROID) 75 MCG tablet, Take 75 mcg by mouth every other day., Disp: , Rfl:    nitroGLYCERIN (NITROSTAT) 0.4 MG SL tablet, Place 1 tablet (0.4 mg total) under the tongue every 5 (five) minutes as needed for chest pain., Disp: 25 tablet, Rfl: 1   olmesartan (BENICAR)  20 MG tablet, Take 20 mg by mouth in the morning., Disp: , Rfl:    pantoprazole (PROTONIX) 40 MG tablet, Take 1 tablet (40 mg total) by mouth in the morning., Disp: 90 tablet, Rfl: 3  Past Medical History: Past Medical History:  Diagnosis Date   Arthritis    spine, neck   Coronary artery disease    GERD (gastroesophageal reflux disease)    Headache    SINUS    Hemorrhoid    Hyperlipemia    Hypertension    Hypothyroidism    Seizures (HCC)     Tobacco Use: Social History   Tobacco Use  Smoking Status Never  Smokeless Tobacco Never    Labs: Review Flowsheet       Latest Ref Rng & Units 03/05/2016 08/04/2016 04/08/2022 11/17/2022  Labs for ITP Cardiac and Pulmonary Rehab  Cholestrol 100 - 199 mg/dL - - 846  962   LDL (calc) 0 - 99 mg/dL - - 952  62   HDL-C >84 mg/dL - - 44  45   Trlycerides 0 - 149 mg/dL - - 132  93   Hemoglobin A1c 4.8 - 5.6 % - - 5.0  -  Bicarbonate 20.0 - 28.0 mmol/L - 24.8  - -  TCO2 0 - 100 mmol/L 28  26  - -  O2 Saturation % - 96.0  - -    Details  Capillary Blood Glucose: Lab Results  Component Value Date   GLUCAP 94 03/05/2016     Exercise Target Goals: Exercise Program Goal: Individual exercise prescription set using results from initial 6 min walk test and THRR while considering  patient's activity barriers and safety.   Exercise Prescription Goal: Initial exercise prescription builds to 30-45 minutes a day of aerobic activity, 2-3 days per week.  Home exercise guidelines will be given to patient during program as part of exercise prescription that the participant will acknowledge.  Activity Barriers & Risk Stratification:  Activity Barriers & Cardiac Risk Stratification - 12/07/22 1538       Activity Barriers & Cardiac Risk Stratification   Activity Barriers Balance Concerns;Joint Problems;History of Falls;Neck/Spine Problems;Other (comment);Back Problems;Arthritis    Comments ? h/o seizures vs TIA    Cardiac Risk  Stratification High             6 Minute Walk:  6 Minute Walk     Row Name 12/07/22 1443         6 Minute Walk   Phase Initial     Distance 1522 feet     Walk Time 6 minutes     # of Rest Breaks 0     MPH 2.9     METS 2.6     RPE 9     Perceived Dyspnea  0     VO2 Peak 9     Symptoms Yes (comment)     Comments Posterior bilateral neck pain 6/10     Resting HR 53 bpm     Resting BP 112/68     Resting Oxygen Saturation  97 %     Exercise Oxygen Saturation  during 6 min walk 97 %     Max Ex. HR 84 bpm     Max Ex. BP 140/70     2 Minute Post BP 124/68              Oxygen Initial Assessment:   Oxygen Re-Evaluation:   Oxygen Discharge (Final Oxygen Re-Evaluation):   Initial Exercise Prescription:  Initial Exercise Prescription - 12/07/22 1500       Date of Initial Exercise RX and Referring Provider   Date 12/07/22    Referring Provider Lennie Odor, Md    Expected Discharge Date 03/02/23      Recumbant Bike   Level 1    RPM 12    Watts 20    Minutes 15    METs 2.6      NuStep   Level 2    SPM 75    Minutes 15    METs 2.6      Prescription Details   Frequency (times per week) 3    Duration Progress to 30 minutes of continuous aerobic without signs/symptoms of physical distress      Intensity   THRR 40-80% of Max Heartrate 55-110    Ratings of Perceived Exertion 11-13    Perceived Dyspnea 0-4      Progression   Progression Continue progressive overload as per policy without signs/symptoms or physical distress.      Resistance Training   Training Prescription Yes    Weight 3 lbs    Reps 10-15             Perform Capillary Blood Glucose checks as needed.  Exercise Prescription Changes:   Exercise Prescription Changes     Row Name 12/13/22 1626 01/03/23 1634 01/17/23 1643 02/02/23 1624  Response to Exercise   Blood Pressure (Admit) 108/62 124/60 148/60 112/60    Blood Pressure (Exercise) 142/76 122/68 136/62 130/60     Blood Pressure (Exit) 98/62 109/63 104/64 112/64    Heart Rate (Admit) 66 bpm 78 bpm 63 bpm 67 bpm    Heart Rate (Exercise) 82 bpm 114 bpm 87 bpm 83 bpm    Heart Rate (Exit) 65 bpm 60 bpm 58 bpm 72 bpm    Rating of Perceived Exertion (Exercise) 11 11 11  10.5    Perceived Dyspnea (Exercise) 0 0 0 0    Symptoms low bp post, asymptomatic -- -- none    Comments Pt first day in teh CRP2 program Reviewed MET's, goals and home ExRx Reviewed MET's Reviewed MET's and goals    Duration Progress to 30 minutes of  aerobic without signs/symptoms of physical distress Progress to 30 minutes of  aerobic without signs/symptoms of physical distress Progress to 30 minutes of  aerobic without signs/symptoms of physical distress Progress to 30 minutes of  aerobic without signs/symptoms of physical distress    Intensity THRR unchanged THRR unchanged THRR unchanged THRR unchanged      Progression   Progression Continue to progress workloads to maintain intensity without signs/symptoms of physical distress. Continue to progress workloads to maintain intensity without signs/symptoms of physical distress. Continue to progress workloads to maintain intensity without signs/symptoms of physical distress. Continue to progress workloads to maintain intensity without signs/symptoms of physical distress.    Average METs 2.2 3.25 3.35 3.75      Resistance Training   Training Prescription Yes Yes Yes No    Weight 3 lbs 4 lbs wts 4 lbs wts 4 lbs wts    Reps 10-15 10-15 10-15 10-15    Time 10 Minutes 10 Minutes 10 Minutes 10 Minutes      Recumbant Bike   Level 1 3 3 3     RPM 15 54 37 63    Watts 66 -- 69 36    Minutes 15 15 15 15     METs 2.1 3.4 3.6 3.5      NuStep   Level 2 3 3 3     SPM 74 71 87 94    Minutes 15 15 15 15     METs 2.6 3.1 3.1 3.5      Home Exercise Plan   Plans to continue exercise at -- Home (comment) Home (comment) Home (comment)    Frequency -- Add 2 additional days to program exercise sessions.  Add 2 additional days to program exercise sessions. Add 2 additional days to program exercise sessions.    Initial Home Exercises Provided -- 01/03/23 01/03/23 01/03/23             Exercise Comments:   Exercise Comments     Row Name 12/13/22 1644 01/03/23 1642 01/17/23 1645 02/02/23 1629 02/23/23 0845   Exercise Comments Pt first day in the [facility] program. Pt tolerated exercise well with an average MET level of 2.2. Pt is learning her THRR, RPE and ExRX and is off to a great start. Reviewed MET's, goals and home ExRx. Pt tolerated exercise well with an average MET level of 3.25. Pt feels good about her goals and is already seeing improvements in her ablility to preform ADL's. She is able to do more and is enjoying kayaking, taking care of [place] and has been able to decrease some mediactions. She is already doing some exercise, but plans to be more regular/structured. She  will exercise by walking, kayaking, weights and stretches. for 30-60 mins 1-2 days Reviewed MET's. Pt tolerated exercise well with an average MET level of 3.25. Pt does continue to have chest burning, but is being followed by cardiology for this issue. Other than that, she feels good about her progress and is doing well Reviewed MET's and goals. Pt tolerated exercise well with an average MET level of 3.75. Pt feels good about her goals and she is getting back to her normal activites. She has made a lot of dietary changes, is back to kayaking and has improved sleep. Overall she feels good Patient out on Medical hold, will review education when she is cleared to retrun. Last date in exercise was 02/07/23            Exercise Goals and Review:   Exercise Goals     Row Name 12/07/22 1521             Exercise Goals   Increase Physical Activity Yes       Intervention Provide advice, education, support and counseling about physical activity/exercise needs.;Develop an individualized exercise prescription for  aerobic and resistive training based on initial evaluation findings, risk stratification, comorbidities and participant's personal goals.       Expected Outcomes Short Term: Attend rehab on a regular basis to increase amount of physical activity.;Long Term: Exercising regularly at least 3-5 days a week.;Long Term: Add in home exercise to make exercise part of routine and to increase amount of physical activity.       Increase Strength and Stamina Yes       Intervention Provide advice, education, support and counseling about physical activity/exercise needs.;Develop an individualized exercise prescription for aerobic and resistive training based on initial evaluation findings, risk stratification, comorbidities and participant's personal goals.       Expected Outcomes Short Term: Increase workloads from initial exercise prescription for resistance, speed, and METs.;Short Term: Perform resistance training exercises routinely during rehab and add in resistance training at home;Long Term: Improve cardiorespiratory fitness, muscular endurance and strength as measured by increased METs and functional capacity ( )       Able to understand and use rate of perceived exertion (RPE) scale Yes       Intervention Provide education and explanation on how to use RPE scale       Expected Outcomes Short Term: Able to use RPE daily in rehab to express subjective intensity level;Long Term:  Able to use RPE to guide intensity level when exercising independently       Knowledge and understanding of Target Heart Rate Range (THRR) Yes       Intervention Provide education and explanation of THRR including how the numbers were predicted and where they are located for reference       Expected Outcomes Short Term: Able to state/look up THRR;Long Term: Able to use THRR to govern intensity when exercising independently;Short Term: Able to use daily as guideline for intensity in rehab       Understanding of Exercise Prescription  Yes       Intervention Provide education, explanation, and written materials on patient's individual exercise prescription       Expected Outcomes Short Term: Able to explain program exercise prescription;Long Term: Able to explain home exercise prescription to exercise independently                Exercise Goals Re-Evaluation :  Exercise Goals Re-Evaluation     Row Name 12/13/22 1631 01/03/23 1638  02/02/23 1627         Exercise Goal Re-Evaluation   Exercise Goals Review Increase Physical Activity;Understanding of Exercise Prescription;Increase Strength and Stamina;Knowledge and understanding of Target Heart Rate Range (THRR);Able to understand and use rate of perceived exertion (RPE) scale Increase Physical Activity;Understanding of Exercise Prescription;Increase Strength and Stamina;Knowledge and understanding of Target Heart Rate Range (THRR);Able to understand and use rate of perceived exertion (RPE) scale Increase Physical Activity;Understanding of Exercise Prescription;Increase Strength and Stamina;Knowledge and understanding of Target Heart Rate Range (THRR);Able to understand and use rate of perceived exertion (RPE) scale     Comments Pt first day in the [facility] program. Pt tolerated exercise well with an average MET level of 2.2. Pt is learning her THRR, RPE and ExRX and is off to a great start. Reviewed MET's, goals and home ExRx. Pt tolerated exercise well with an average MET level of  3.25. Pt feels good about her goals and is already seeing improvements in her ablility to preform ADL's. She is able to do more and is enjoying kayaking, taking [place] and has been able to decrease some mediactions. She is already doing some exercise, but plans to be more regular/structured. She will exercise by walking, kayaking, weights and stretches. for 30-60 mins 1-2 days Reviewed MET's and goals. Pt tolerated exercise well with an average MET level of  3.75. Pt feels good about  her goals and she is getting back to her normal activites. She has made a lot of dietary changes, is back to kayaking and has improved sleep. Overall she feels good     Expected Outcomes Will continue to monitor pt and progress workloads as tolerated without sign or symptom Will continue to monitor pt and progress workloads as tolerated without sign or symptom Will continue to monitor pt and progress workloads as tolerated without sign or symptom              Discharge Exercise Prescription (Final Exercise Prescription Changes):  Exercise Prescription Changes - 02/02/23 1624       Response to Exercise   Blood Pressure (Admit) 112/60    Blood Pressure (Exercise) 130/60    Blood Pressure (Exit) 112/64    Heart Rate (Admit) 67 bpm    Heart Rate (Exercise) 83 bpm    Heart Rate (Exit) 72 bpm    Rating of Perceived Exertion (Exercise) 10.5    Perceived Dyspnea (Exercise) 0    Symptoms none    Comments Reviewed MET's and goals    Duration Progress to 30 minutes of  aerobic without signs/symptoms of physical distress    Intensity THRR unchanged      Progression   Progression Continue to progress workloads to maintain intensity without signs/symptoms of physical distress.    Average METs 3.75      Resistance Training   Training Prescription No    Weight 4 lbs wts    Reps 10-15    Time 10 Minutes      Recumbant Bike   Level 3    RPM 63    Watts 36    Minutes 15    METs 3.5      NuStep   Level 3    SPM 94    Minutes 15    METs 3.5      Home Exercise Plan   Plans to continue exercise at Home (comment)    Frequency Add 2 additional days to program exercise sessions.    Initial  Home Exercises Provided 01/03/23             Nutrition:  Target Goals: Understanding of nutrition guidelines, daily intake of sodium 1500mg , cholesterol 200mg , calories 30% from fat and 7% or less from saturated fats, daily to have 5 or more servings of fruits and vegetables.  Biometrics:   Pre Biometrics - 12/07/22 1350       Pre Biometrics   Waist Circumference 33 inches    Hip Circumference 40.25 inches    Waist to Hip Ratio 0.82 %    Triceps Skinfold 26 mm    % Body Fat 37.9 %    Grip Strength 18 kg    Flexibility 17 in    Single Leg Stand 6.8 seconds              Nutrition Therapy Plan and Nutrition Goals:  Nutrition Therapy & Goals - 02/07/23 1617       Nutrition Therapy   Diet Heart healthy diet    Drug/Food Interactions Statins/Certain Fruits      Personal Nutrition Goals   Nutrition Goal Patient to identify strategies for reducing cardiovascular risk by attending the [facility] education and nutrition series weekly.   goal in action.   Personal Goal #2 Patient to improve diet quality by using the plate method as a guide for meal planning to include lean protein/plant protein, fruits, vegetables, whole grains, nonfat dairy as part of a well-balanced diet.   goal in action.   Personal Goal #3 Patient to reduce sodium intake to 1500mg  per day   goal in action.   Comments Goals in action. Samar continues to attend the [facility] education and nutrition series regularly. Mahima reports following a low sugar diet with close attention to saturated fat and sodium intake. 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 LDL and A1c remain well controlled. She is down 3.4# since starting with our program; BMI remains appropriate. Patient will benefit from participation in intensive cardiac rehab for nutrition, exercise, and lifestyle modification.      Intervention Plan   Intervention Prescribe, educate and counsel regarding individualized specific dietary modifications aiming towards targeted core components such as weight, hypertension, lipid management, diabetes, heart failure and other comorbidities.;Nutrition handout(s) given to patient.    Expected Outcomes Short Term Goal: Understand basic principles of dietary content, such as calories, fat, sodium,  cholesterol and nutrients.;Long Term Goal: Adherence to prescribed nutrition plan.             Nutrition Assessments:  Nutrition Assessments - 12/16/22 1200       Rate Your Plate Scores   Pre Score 68            MEDIFICTS Score Key: >=70 Need to make dietary changes  40-70 Heart Healthy Diet <= 40 Therapeutic Level Cholesterol Diet   Flowsheet Row INTENSIVE CARDIAC REHAB from 12/15/2022 in [facility]  Picture Your Plate Total Score on Admission 68      Picture Your Plate Scores: <53 Unhealthy dietary pattern with much room for improvement. 41-50 Dietary pattern unlikely to meet recommendations for good health and room for improvement. 51-60 More healthful dietary pattern, with some room for improvement.  >60 Healthy dietary pattern, although there may be some specific behaviors that could be improved.    Nutrition Goals Re-Evaluation:  Nutrition Goals Re-Evaluation     Row Name 12/13/22 1548 01/12/23 1413 02/07/23 1617  Goals   Current Weight 141 lb 15.6 oz (64.4 kg) 140 lb 3.4 oz (63.6 kg) 139 lb 8.8 oz (63.3 kg)     Comment A1c WNL, lipids WNL no new labs; most recent labs  A1c WNL, lipids WNL LDL at goal of <70 (63), lipid panel WNL, A1c WNL     Expected Outcome Natali reports following a low sugar diet with close attention to saturated fat and sodium intake. She denies nutrition questions/concerns at this time. 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 Goals in action. Nyala continues to attend the [facility] education and nutrition series regularly. Sundai reports following a low sugar diet with close attention to saturated fat and sodium intake. 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 She is down 1.8# since starting with our program; BMI remains appropriate. Patient will  benefit from participation in intensive cardiac rehab for nutrition, exercise, and lifestyle modification. Goals in action. Keliah continues to attend the [facility] education and nutrition series regularly. Britnie reports following a low sugar diet with close attention to saturated fat and sodium intake. 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 LDL and A1c remain well controlled. She is down 3.4# since starting with our program; BMI remains appropriate. Patient will benefit from participation in intensive cardiac rehab for nutrition, exercise, and lifestyle modification.              Nutrition Goals Re-Evaluation:  Nutrition Goals Re-Evaluation     Row Name 12/13/22 1548 01/12/23 1413 02/07/23 1617         Goals   Current Weight 141 lb 15.6 oz (64.4 kg) 140 lb 3.4 oz (63.6 kg) 139 lb 8.8 oz (63.3 kg)     Comment A1c WNL, lipids WNL no new labs; most recent labs  A1c WNL, lipids WNL LDL at goal of <70 (63), lipid panel WNL, A1c WNL     Expected Outcome Sonam reports following a low sugar diet with close attention to saturated fat and sodium intake. She denies nutrition questions/concerns at this time. 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 Goals in action. Gerrianne continues to attend the [facility] education and nutrition series regularly. Delbert reports following a low sugar diet with close attention to saturated fat and sodium intake. 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 She is down 1.8# since starting with our program; BMI remains appropriate. Patient will benefit from participation in intensive cardiac rehab for nutrition, exercise, and lifestyle modification. Goals in action. Aliyaah continues to attend the [facility] education and nutrition series regularly. Athira reports following a low sugar diet with close attention to saturated fat and sodium intake. 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 LDL and A1c remain well controlled. She is down 3.4# since starting with our program; BMI remains appropriate. Patient will benefit from participation in intensive cardiac rehab for nutrition, exercise, and lifestyle modification.              Nutrition Goals Discharge (Final Nutrition Goals Re-Evaluation):  Nutrition Goals Re-Evaluation - 02/07/23 1617       Goals   Current Weight 139 lb 8.8 oz (63.3 kg)    Comment LDL at goal of <70 (63), lipid panel WNL, A1c WNL    Expected Outcome Goals in action. Roda continues to attend the [facility] education and nutrition  series regularly. Cele reports following a low sugar diet with close attention to saturated fat and sodium intake. 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 LDL and A1c remain well controlled. She is down 3.4# since starting with our program; BMI remains appropriate. Patient will benefit from participation in intensive cardiac rehab for nutrition, exercise, and lifestyle modification.             Psychosocial: Target Goals: Acknowledge presence or absence of significant depression and/or stress, maximize coping skills, provide positive support system. Participant is able to verbalize types and ability to use techniques and skills needed for reducing stress and depression.  Initial Review & Psychosocial Screening:  Initial Psych Review & Screening - 12/07/22 1534       Initial Review   Current issues with Current Sleep Concerns;Current Stress Concerns    Source of Stress Concerns Family    Comments Pt recently diagnosed with Dementia      Family Dynamics   Good Support System? Yes   Pt has son/daughter-in-law, sister, neighbors, church family     Barriers   Psychosocial barriers to participate in program The patient should benefit from training in stress management and relaxation.      Screening Interventions   Interventions Encouraged to exercise              Quality of Life Scores:  Quality of Life - 12/07/22 1550       Quality of Life   Select Quality of Life      Quality of Life Scores   Health/Function Pre 25.47 %    Socioeconomic Pre 26.92 %    Psych/Spiritual Pre 27.43 %    Family Pre 28.8 %    GLOBAL Pre 26.65 %            Scores of 19 and below usually indicate a poorer quality of life in these areas.  A difference of  2-3 points is a clinically meaningful difference.  A difference of 2-3 points in the total score of the Quality of Life Index has been associated with significant improvement in overall quality of life, self-image, physical symptoms, and general health in studies assessing change in quality of life.  PHQ-9: Review Flowsheet       12/07/2022  Depression screen PHQ 2/9  Decreased Interest 1  Down, Depressed, Hopeless 0  PHQ - 2 Score 1  Altered sleeping 2  Tired, decreased energy 2  Change in appetite 1  Feeling bad or failure about yourself  0  Trouble concentrating 0  Moving slowly or fidgety/restless 0  Suicidal thoughts 0  PHQ-9 Score 6  Difficult doing work/chores Not difficult at all    Details           Interpretation of Total Score  Total Score Depression Severity:  1-4 = Minimal depression, 5-9 = Mild depression, 10-14 = Moderate depression, 15-19 = Moderately severe depression, 20-27 = Severe depression   Psychosocial Evaluation and Intervention:   Psychosocial Re-Evaluation:  Psychosocial Re-Evaluation     Row Name 12/14/22 0915 12/29/22 1457 01/25/23 0754 02/23/23 9604       Psychosocial Re-Evaluation   Current issues with Current Stress Concerns Current Stress Concerns Current Stress Concerns Current Stress Concerns    Comments Husband has early stages of dementia. looking at options for future thinking about moving to a retirment community. 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 Nancylee has not voiced any increased concerns or  stressors  during exercise at cardiac rehab Aritha has voiced health concerns regarding her own health as Mylani has had some recurring Angina. Shirly's exercise is currently on hold.    Expected Outcomes Pegg will have decreased or controlled stress upon completion of cardiac rehab Pegg will have decreased or controlled stress upon completion of cardiac rehab Pegg will have decreased or controlled stress upon completion of cardiac rehab Pegg will have decreased or controlled stress upon completion of cardiac rehab    Interventions Stress management education;Relaxation education;Encouraged to attend Cardiac Rehabilitation for the exercise Stress management education;Relaxation education;Encouraged to attend Cardiac Rehabilitation for the exercise Stress management education;Relaxation education;Encouraged to attend Cardiac Rehabilitation for the exercise Stress management education;Relaxation education;Encouraged to attend Cardiac Rehabilitation for the exercise    Continue Psychosocial Services  Follow up required by staff Follow up required by staff Follow up required by staff Follow up required by staff      Initial Review   Source of Stress Concerns Family Family Family Family    Comments Will continue to monitor and offer support as needed Will continue to monitor and offer support as needed Will continue to monitor and offer support as needed Will continue to monitor and offer support as needed             Psychosocial Discharge (Final Psychosocial Re-Evaluation):  Psychosocial Re-Evaluation - 02/23/23 4098       Psychosocial Re-Evaluation   Current issues with Current Stress Concerns    Comments Gari's exercise is currently on hold.    Expected Outcomes Pegg will have decreased or controlled stress upon completion of cardiac rehab    Interventions Stress management education;Relaxation education;Encouraged to attend Cardiac Rehabilitation for the exercise    Continue Psychosocial Services  Follow  up required by staff      Initial Review   Source of Stress Concerns Family    Comments Will continue to monitor and offer support as needed             Vocational Rehabilitation: Provide vocational rehab assistance to qualifying candidates.   Vocational Rehab Evaluation & Intervention:  Vocational Rehab - 12/07/22 1537       Initial Vocational Rehab Evaluation & Intervention   Assessment shows need for Vocational Rehabilitation No   Pt is a retired Engineer, site            Education: Education Goals: Education classes will be provided on a weekly basis, covering required topics. Participant will state understanding/return demonstration of topics presented.    Education     Row Name 12/13/22 1600     Education   Cardiac Education Topics Pritikin   Psychologist, forensic Exercise Education   Exercise Education Biomechanial Limitations   Instruction Review Code 1- Verbalizes Understanding   Class Start Time 1406   Class Stop Time 1449   Class Time Calculation (min) 43 min    Row Name 12/15/22 1600     Education   Cardiac Education Topics Pritikin   Customer service manager   Weekly Topic Comforting Weekend Breakfasts   Instruction Review Code 1- Verbalizes Understanding   Class Start Time 1400   Class Stop Time 1445   Class Time Calculation (min) 45 min    Row Name 12/20/22 1500     Education   Cardiac Education Topics Pritikin   Select Core  Videos     Core Videos   Educator Dietitian   Select Nutrition   Nutrition Facts on Fat   Instruction Review Code 1- Verbalizes Understanding   Class Start Time 1400   Class Stop Time 1440   Class Time Calculation (min) 40 min    Row Name 12/22/22 1500     Education   Cardiac Education Topics Pritikin   Customer service manager   Weekly Topic Fast Evening Meals    Instruction Review Code 1- Verbalizes Understanding   Class Start Time 1400   Class Stop Time 1440   Class Time Calculation (min) 40 min    Row Name 12/24/22 1500     Education   Cardiac Education Topics Pritikin     Core Videos   Educator Dietitian   Select Nutrition   Nutrition Vitamins and Minerals   Instruction Review Code 1- Verbalizes Understanding   Class Start Time 1400   Class Stop Time 1445   Class Time Calculation (min) 45 min    Row Name 12/27/22 1600     Education   Cardiac Education Topics Pritikin   Geographical information systems officer Psychosocial   Psychosocial Workshop Healthy Sleep for a Healthy Heart   Instruction Review Code 1- Verbalizes Understanding   Class Start Time 1400   Class Stop Time 1455   Class Time Calculation (min) 55 min    Row Name 12/29/22 1600     Education   Cardiac Education Topics Pritikin   Customer service manager   Weekly Topic International Cuisine- Spotlight on the [employer] Zones   Instruction Review Code 1- Verbalizes Understanding   Class Start Time 1400   Class Stop Time 1448   Class Time Calculation (min) 48 min    Row Name 12/31/22 1500     Education   Cardiac Education Topics Pritikin   Psychologist, forensic Exercise Education   Exercise Education Improving Performance   Instruction Review Code 1- Verbalizes Understanding   Class Start Time 1400   Class Stop Time 1455   Class Time Calculation (min) 55 min    Row Name 01/03/23 1500     Education   Cardiac Education Topics Pritikin   Glass blower/designer Nutrition   Nutrition Workshop Fueling a Forensic psychologist   Instruction Review Code 1- Tax inspector   Class Start Time 1400   Class Stop Time 1452   Class Time Calculation (min) 52 min    Row Name 01/05/23 1500      Education   Cardiac Education Topics Pritikin   Customer service manager   Weekly Topic Simple Sides and Sauces   Instruction Review Code 1- Verbalizes Understanding   Class Start Time 1400   Class Stop Time 1445   Class Time Calculation (min) 45 min    Row Name 01/12/23 1500     Education   Cardiac Education Topics Pritikin   Customer service manager   Weekly Topic Powerhouse Plant-Based Proteins   Instruction Review Code 1- Verbalizes Understanding   Class Start Time 1400  Class Stop Time 1445   Class Time Calculation (min) 45 min    Row Name 01/14/23 1400     Education   Cardiac Education Topics Pritikin   Psychologist, forensic General Education   General Education Hypertension and Heart Disease   Instruction Review Code 1- Verbalizes Understanding   Class Start Time 1410   Class Stop Time 1445   Class Time Calculation (min) 35 min    Row Name 01/17/23 1500     Education   Cardiac Education Topics Pritikin   Geographical information systems officer Psychosocial   Psychosocial Workshop From Head to Heart: The Power of a Healthy Outlook   Instruction Review Code 1- Verbalizes Understanding   Class Start Time 1400   Class Stop Time 1446   Class Time Calculation (min) 46 min    Row Name 01/19/23 1500     Education   Cardiac Education Topics Pritikin   Customer service manager   Weekly Topic Adding Flavor - Sodium-Free   Instruction Review Code 1- Verbalizes Understanding   Class Start Time 1400   Class Stop Time 1437   Class Time Calculation (min) 37 min    Row Name 01/21/23 1400     Education   Cardiac Education Topics Pritikin   Hospital doctor Education   General Education  Heart Disease Risk Reduction   Instruction Review Code 1- Verbalizes Understanding   Class Start Time 1355   Class Stop Time 1436   Class Time Calculation (min) 41 min    Row Name 01/24/23 1500     Education   Cardiac Education Topics Pritikin   Licensed conveyancer Nutrition   Nutrition Overview of the Pritikin Eating Plan   Instruction Review Code 1- Verbalizes Understanding   Class Start Time 1400   Class Stop Time 1452   Class Time Calculation (min) 52 min    Row Name 01/26/23 1500     Education   Cardiac Education Topics Pritikin   Customer service manager   Weekly Topic Fast and Healthy Breakfasts   Instruction Review Code 1- Verbalizes Understanding   Class Start Time 1400   Class Stop Time 1445   Class Time Calculation (min) 45 min    Row Name 01/28/23 1400     Education   Cardiac Education Topics Pritikin   Nurse, children's Exercise Physiologist   Select Psychosocial   Psychosocial Healthy Minds, Bodies, Hearts   Instruction Review Code 1- Verbalizes Understanding   Class Start Time 1402   Class Stop Time 1445   Class Time Calculation (min) 43 min    Row Name 02/02/23 1500     Education   Cardiac Education Topics Pritikin   Customer service manager   Weekly Topic Personalizing Your Pritikin Plate   Instruction Review Code 1- Verbalizes Understanding   Class Start Time 1400   Class Stop Time 1440   Class Time Calculation (min) 40 min    Row Name  02/04/23 1500     Education   Cardiac Education Topics Pritikin   Geographical information systems officer Exercise   Exercise Workshop Location manager and Fall Prevention   Instruction Review Code 1- Verbalizes Understanding   Class Start Time 1405   Class Stop Time 1455   Class Time Calculation (min) 50 min     Row Name 02/07/23 1600     Education   Cardiac Education Topics Pritikin   Glass blower/designer Nutrition   Nutrition Workshop Label Reading   Instruction Review Code 1- Teaching laboratory technician Start Time 1400   Class Stop Time 1445   Class Time Calculation (min) 45 min            Core Videos: Exercise    Move It!  Clinical staff conducted group or individual video education with verbal and written material and guidebook.  Patient learns the recommended Pritikin exercise program. Exercise with the goal of living a long, healthy life. Some of the health benefits of exercise include controlled diabetes, healthier blood pressure levels, improved cholesterol levels, improved heart and lung capacity, improved sleep, and better body composition. Everyone should speak with their doctor before starting or changing an exercise routine.  Biomechanical Limitations Clinical staff conducted group or individual video education with verbal and written material and guidebook.  Patient learns how biomechanical limitations can impact exercise and how we can mitigate and possibly overcome limitations to have an impactful and balanced exercise routine.  Body Composition Clinical staff conducted group or individual video education with verbal and written material and guidebook.  Patient learns that body composition (ratio of muscle mass to fat mass) is a key component to assessing overall fitness, rather than body weight alone. Increased fat mass, especially visceral belly fat, can put [place] at increased risk for metabolic syndrome, type 2 diabetes, heart disease, and even death. It is recommended to combine diet and exercise (cardiovascular and resistance training) to improve your body composition. Seek guidance from your physician and exercise physiologist before implementing an exercise routine.  Exercise Action Plan Clinical staff conducted group or  individual video education with verbal and written material and guidebook.  Patient learns the recommended strategies to achieve and enjoy long-term exercise adherence, including variety, self-motivation, self-efficacy, and positive decision making. Benefits of exercise include fitness, good health, weight management, more energy, better sleep, less stress, and overall well-being.  Medical   Heart Disease Risk Reduction Clinical staff conducted group or individual video education with verbal and written material and guidebook.  Patient learns our heart is our most vital organ as it circulates oxygen, nutrients, white blood cells, and hormones throughout the entire body, and carries waste away. Data supports a plant-based eating plan like the Pritikin Program for its effectiveness in slowing progression of and reversing heart disease. The video provides a number of recommendations to address heart disease.   Metabolic Syndrome and Belly Fat  Clinical staff conducted group or individual video education with verbal and written material and guidebook.  Patient learns what metabolic syndrome is, how it leads to heart disease, and how one can reverse it and keep it from coming back. You have metabolic syndrome if you have 3 of the following 5 criteria: abdominal obesity, high blood pressure, high triglycerides, low HDL cholesterol, and high blood sugar.  Hypertension and Heart Disease Clinical staff conducted group  or individual video education with verbal and written material and guidebook.  Patient learns that high blood pressure, or hypertension, is very common in the [place]. Hypertension is largely due to excessive salt intake, but other important risk factors include being overweight, physical inactivity, drinking too much alcohol, smoking, and not eating enough potassium from fruits and vegetables. High blood pressure is a leading risk factor for heart attack, stroke, congestive heart failure,  dementia, kidney failure, and premature death. Long-term effects of excessive salt intake include stiffening of the arteries and thickening of heart muscle and organ damage. Recommendations include ways to reduce hypertension and the risk of heart disease.  Diseases of Our Time - Focusing on Diabetes Clinical staff conducted group or individual video education with verbal and written material and guidebook.  Patient learns why the best way to stop diseases of our time is prevention, through food and other lifestyle changes. Medicine (such as prescription pills and surgeries) is often only a Band-Aid on the problem, not a long-term solution. Most common diseases of our time include obesity, type 2 diabetes, hypertension, heart disease, and cancer. The Pritikin Program is recommended and has been proven to help reduce, reverse, and/or prevent the damaging effects of metabolic syndrome.  Nutrition   Overview of the Pritikin Eating Plan  Clinical staff conducted group or individual video education with verbal and written material and guidebook.  Patient learns about the Pritikin Eating Plan for disease risk reduction. The Pritikin Eating Plan emphasizes a wide variety of unrefined, minimally-processed carbohydrates, like fruits, vegetables, whole grains, and legumes. Go, Caution, and Stop food choices are explained. Plant-based and lean animal proteins are emphasized. Rationale provided for low sodium intake for blood pressure control, low added sugars for blood sugar stabilization, and low added fats and oils for coronary artery disease risk reduction and weight management.  Calorie Density  Clinical staff conducted group or individual video education with verbal and written material and guidebook.  Patient learns about calorie density and how it impacts the Pritikin Eating Plan. Knowing the characteristics of the food you choose will help you decide whether those foods will lead to weight gain or weight  loss, and whether you want to consume more or less of them. Weight loss is usually a side effect of the Pritikin Eating Plan because of its focus on low calorie-dense foods.  Label Reading  Clinical staff conducted group or individual video education with verbal and written material and guidebook.  Patient learns about the Pritikin recommended label reading guidelines and corresponding recommendations regarding calorie density, added sugars, sodium content, and whole grains.  Dining Out - Part 1  Clinical staff conducted group or individual video education with verbal and written material and guidebook.  Patient learns that restaurant meals can be sabotaging because they can be so high in calories, fat, sodium, and/or sugar. Patient learns recommended strategies on how to positively address this and avoid unhealthy pitfalls.  Facts on Fats  Clinical staff conducted group or individual video education with verbal and written material and guidebook.  Patient learns that lifestyle modifications can be just as effective, if not more so, as many medications for lowering your risk of heart disease. A Pritikin lifestyle can help to reduce your risk of inflammation and atherosclerosis (cholesterol build-up, or plaque, in the artery walls). Lifestyle interventions such as dietary choices and physical activity address the cause of atherosclerosis. A review of the types of fats and their impact on blood cholesterol levels, along with  dietary recommendations to reduce fat intake is also included.  Nutrition Action Plan  Clinical staff conducted group or individual video education with verbal and written material and guidebook.  Patient learns how to incorporate Pritikin recommendations into their lifestyle. Recommendations include planning and keeping personal health goals in mind as an important part of their success.  Healthy Mind-Set    Healthy Minds, Bodies, Hearts  Clinical staff conducted group or  individual video education with verbal and written material and guidebook.  Patient learns how to identify when they are stressed. Video will discuss the impact of that stress, as well as the many benefits of stress management. Patient will also be introduced to stress management techniques. The way we think, act, and feel has an impact on our hearts.  How Our Thoughts Can Heal Our Hearts  Clinical staff conducted group or individual video education with verbal and written material and guidebook.  Patient learns that negative thoughts can cause depression and anxiety. This can result in negative lifestyle behavior and serious health problems. Cognitive behavioral therapy is an effective method to help control our thoughts in order to change and improve our emotional outlook.  Additional Videos:  Exercise    Improving Performance  Clinical staff conducted group or individual video education with verbal and written material and guidebook.  Patient learns to use a non-linear approach by alternating intensity levels and lengths of time spent exercising to help burn more calories and lose more body fat. Cardiovascular exercise helps improve heart health, metabolism, hormonal balance, blood sugar control, and recovery from fatigue. Resistance training improves strength, endurance, balance, coordination, reaction time, metabolism, and muscle mass. Flexibility exercise improves circulation, posture, and balance. Seek guidance from your physician and exercise physiologist before implementing an exercise routine and learn your capabilities and proper form for all exercise.  Introduction to Yoga  Clinical staff conducted group or individual video education with verbal and written material and guidebook.  Patient learns about yoga, a discipline of the coming together of mind, breath, and body. The benefits of yoga include improved flexibility, improved range of motion, better posture and core strength, increased  lung function, weight loss, and positive self-image. Yoga's heart health benefits include lowered blood pressure, healthier heart rate, decreased cholesterol and triglyceride levels, improved immune function, and reduced stress. Seek guidance from your physician and exercise physiologist before implementing an exercise routine and learn your capabilities and proper form for all exercise.  Medical   Aging: Enhancing Your Quality of Life  Clinical staff conducted group or individual video education with verbal and written material and guidebook.  Patient learns key strategies and recommendations to stay in good physical health and enhance quality of life, such as prevention strategies, having an advocate, securing a Health Care Proxy and Power of Attorney, and keeping a list of medications and system for tracking them. It also discusses how to avoid risk for bone loss.  Biology of Weight Control  Clinical staff conducted group or individual video education with verbal and written material and guidebook.  Patient learns that weight gain occurs because we consume more calories than we burn (eating more, moving less). Even if your body weight is normal, you may have higher ratios of fat compared to muscle mass. Too much body fat puts you at increased risk for cardiovascular disease, heart attack, stroke, type 2 diabetes, and obesity-related cancers. In addition to exercise, following the Pritikin Eating Plan can help reduce your risk.  Decoding Lab Results  Clinical staff  conducted group or individual video education with verbal and written material and guidebook.  Patient learns that lab test reflects one measurement whose values change over time and are influenced by many factors, including medication, stress, sleep, exercise, food, hydration, pre-existing medical conditions, and more. It is recommended to use the knowledge from this video to become more involved with your lab results and evaluate your  numbers to speak with your doctor.   Diseases of Our Time - Overview  Clinical staff conducted group or individual video education with verbal and written material and guidebook.  Patient learns that according to the CDC, 50% to 70% of chronic diseases (such as obesity, type 2 diabetes, elevated lipids, hypertension, and heart disease) are avoidable through lifestyle improvements including healthier food choices, listening to satiety cues, and increased physical activity.  Sleep Disorders Clinical staff conducted group or individual video education with verbal and written material and guidebook.  Patient learns how good quality and duration of sleep are important to overall health and well-being. Patient also learns about sleep disorders and how they impact health along with recommendations to address them, including discussing with a physician.  Nutrition  Dining Out - Part 2 Clinical staff conducted group or individual video education with verbal and written material and guidebook.  Patient learns how to plan ahead and communicate in order to maximize their dining experience in a healthy and nutritious manner. Included are recommended food choices based on the type of restaurant the patient is visiting.   Fueling a Banker conducted group or individual video education with verbal and written material and guidebook.  There is a strong connection between our food choices and our health. Diseases like obesity and type 2 diabetes are very prevalent and are in large-part due to lifestyle choices. The Pritikin Eating Plan provides plenty of food and hunger-curbing satisfaction. It is easy to follow, affordable, and helps reduce health risks.  Menu Workshop  Clinical staff conducted group or individual video education with verbal and written material and guidebook.  Patient learns that restaurant meals can sabotage health goals because they are often packed with calories, fat,  sodium, and sugar. Recommendations include strategies to plan ahead and to communicate with the manager, chef, or server to help order a healthier meal.  Planning Your Eating Strategy  Clinical staff conducted group or individual video education with verbal and written material and guidebook.  Patient learns about the Pritikin Eating Plan and its benefit of reducing the risk of disease. The Pritikin Eating Plan does not focus on calories. Instead, it emphasizes high-quality, nutrient-rich foods. By knowing the characteristics of the foods, we choose, we can determine their calorie density and make informed decisions.  Targeting Your Nutrition Priorities  Clinical staff conducted group or individual video education with verbal and written material and guidebook.  Patient learns that lifestyle habits have a tremendous impact on disease risk and progression. This video provides eating and physical activity recommendations based on your personal health goals, such as reducing LDL cholesterol, losing weight, preventing or controlling type 2 diabetes, and reducing high blood pressure.  Vitamins and Minerals  Clinical staff conducted group or individual video education with verbal and written material and guidebook.  Patient learns different ways to obtain key vitamins and minerals, including through a recommended healthy diet. It is important to discuss all supplements you take with your doctor.   Healthy Mind-Set    Smoking Cessation  Clinical staff conducted group or individual video  education with verbal and written material and guidebook.  Patient learns that cigarette smoking and tobacco addiction pose a serious health risk which affects millions of people. Stopping smoking will significantly reduce the risk of heart disease, lung disease, and many forms of cancer. Recommended strategies for quitting are covered, including working with your doctor to develop a successful plan.  Culinary    Becoming a Set designer conducted group or individual video education with verbal and written material and guidebook.  Patient learns that cooking at home can be healthy, cost-effective, quick, and puts them in control. Keys to cooking healthy recipes will include looking at your recipe, assessing your equipment needs, planning ahead, making it simple, choosing cost-effective seasonal ingredients, and limiting the use of added fats, salts, and sugars.  Cooking - Breakfast and Snacks  Clinical staff conducted group or individual video education with verbal and written material and guidebook.  Patient learns how important breakfast is to satiety and nutrition through the entire day. Recommendations include key foods to eat during breakfast to help stabilize blood sugar levels and to prevent overeating at meals later in the day. Planning ahead is also a key component.  Cooking - Educational psychologist conducted group or individual video education with verbal and written material and guidebook.  Patient learns eating strategies to improve overall health, including an approach to cook more at home. Recommendations include thinking of animal protein as a side on your plate rather than center stage and focusing instead on lower calorie dense options like vegetables, fruits, whole grains, and plant-based proteins, such as beans. Making sauces in large quantities to freeze for later and leaving the skin on your vegetables are also recommended to maximize your experience.  Cooking - Healthy Salads and Dressing Clinical staff conducted group or individual video education with verbal and written material and guidebook.  Patient learns that vegetables, fruits, whole grains, and legumes are the foundations of the Pritikin Eating Plan. Recommendations include how to incorporate each of these in flavorful and healthy salads, and how to create homemade salad dressings. Proper handling of  ingredients is also covered. Cooking - Soups and [employer] - Soups and Desserts Clinical staff conducted group or individual video education with verbal and written material and guidebook.  Patient learns that Pritikin soups and desserts make for easy, nutritious, and delicious snacks and meal components that are low in sodium, fat, sugar, and calorie density, while high in vitamins, minerals, and filling fiber. Recommendations include simple and healthy ideas for soups and desserts.   Overview     The Pritikin Solution Program Overview Clinical staff conducted group or individual video education with verbal and written material and guidebook.  Patient learns that the results of the Pritikin Program have been documented in more than 100 articles published in peer-reviewed journals, and the benefits include reducing risk factors for (and, in some cases, even reversing) high cholesterol, high blood pressure, type 2 diabetes, obesity, and more! An overview of the three key pillars of the Pritikin Program will be covered: eating well, doing regular exercise, and having a healthy mind-set.  WORKSHOPS  Exercise: Exercise Basics: Building Your Action Plan Clinical staff led group instruction and group discussion with PowerPoint presentation and patient guidebook. To enhance the learning environment the use of posters, models and videos may be added. At the conclusion of this workshop, patients will comprehend the difference between physical activity and exercise, as well as the benefits of  incorporating both, into their routine. Patients will understand the FITT (Frequency, Intensity, Time, and Type) principle and how to use it to build an exercise action plan. In addition, safety concerns and other considerations for exercise and cardiac rehab will be addressed by the presenter. The purpose of this lesson is to promote a comprehensive and effective weekly exercise routine in order to improve  patients' overall level of fitness.   Managing Heart Disease: Your Path to a Healthier Heart Clinical staff led group instruction and group discussion with PowerPoint presentation and patient guidebook. To enhance the learning environment the use of posters, models and videos may be added.At the conclusion of this workshop, patients will understand the anatomy and physiology of the heart. Additionally, they will understand how Pritikin's three pillars impact the risk factors, the progression, and the management of heart disease.  The purpose of this lesson is to provide a high-level overview of the heart, heart disease, and how the Pritikin lifestyle positively impacts risk factors.  Exercise Biomechanics Clinical staff led group instruction and group discussion with PowerPoint presentation and patient guidebook. To enhance the learning environment the use of posters, models and videos may be added. Patients will learn how the structural parts of their bodies function and how these functions impact their daily activities, movement, and exercise. Patients will learn how to promote a neutral spine, learn how to manage pain, and identify ways to improve their physical movement in order to promote healthy living. The purpose of this lesson is to expose patients to common physical limitations that impact physical activity. Participants will learn practical ways to adapt and manage aches and pains, and to minimize their effect on regular exercise. Patients will learn how to maintain good posture while sitting, walking, and lifting.  Balance Training and Fall Prevention  Clinical staff led group instruction and group discussion with PowerPoint presentation and patient guidebook. To enhance the learning environment the use of posters, models and videos may be added. At the conclusion of this workshop, patients will understand the importance of their sensorimotor skills (vision, proprioception, and the  vestibular system) in maintaining their ability to balance as they age. Patients will apply a variety of balancing exercises that are appropriate for their current level of function. Patients will understand the common causes for poor balance, possible solutions to these problems, and ways to modify their physical environment in order to minimize their fall risk. The purpose of this lesson is to teach patients about the importance of maintaining balance as they age and ways to minimize their risk of falling.  WORKSHOPS   Nutrition:  Fueling a Ship broker led group instruction and group discussion with PowerPoint presentation and patient guidebook. To enhance the learning environment the use of posters, models and videos may be added. Patients will review the foundational principles of the Pritikin Eating Plan and understand what constitutes a serving size in each of the food groups. Patients will also learn Pritikin-friendly foods that are better choices when away from home and review make-ahead meal and snack options. Calorie density will be reviewed and applied to three nutrition priorities: weight maintenance, weight loss, and weight gain. The purpose of this lesson is to reinforce (in a group setting) the key concepts around what patients are recommended to eat and how to apply these guidelines when away from home by planning and selecting Pritikin-friendly options. Patients will understand how calorie density may be adjusted for different weight management goals.  Mindful Eating  Clinical  staff led group instruction and group discussion with PowerPoint presentation and patient guidebook. To enhance the learning environment the use of posters, models and videos may be added. Patients will briefly review the concepts of the Pritikin Eating Plan and the importance of low-calorie dense foods. The concept of mindful eating will be introduced as well as the importance of paying attention  to internal hunger signals. Triggers for non-hunger eating and techniques for dealing with triggers will be explored. The purpose of this lesson is to provide patients with the opportunity to review the basic principles of the Pritikin Eating Plan, discuss the value of eating mindfully and how to measure internal cues of hunger and fullness using the Hunger Scale. Patients will also discuss reasons for non-hunger eating and learn strategies to use for controlling emotional eating.  Targeting Your Nutrition Priorities Clinical staff led group instruction and group discussion with PowerPoint presentation and patient guidebook. To enhance the learning environment the use of posters, models and videos may be added. Patients will learn how to determine their genetic susceptibility to disease by reviewing their family history. Patients will gain insight into the importance of diet as part of an overall healthy lifestyle in mitigating the impact of genetics and other environmental insults. The purpose of this lesson is to provide patients with the opportunity to assess their personal nutrition priorities by looking at their family history, their own health history and current risk factors. Patients will also be able to discuss ways of prioritizing and modifying the Pritikin Eating Plan for their highest risk areas  Menu  Clinical staff led group instruction and group discussion with PowerPoint presentation and patient guidebook. To enhance the learning environment the use of posters, models and videos may be added. Using menus brought in from [employer], or printed from [employer], patients will apply the Pritikin dining out guidelines that were presented in the [employer] video. Patients will also be able to practice these guidelines in a variety of provided scenarios. The purpose of this lesson is to provide patients with the opportunity to practice hands-on learning of the Pritikin  Dining Out guidelines with actual menus and practice scenarios.  Label Reading Clinical staff led group instruction and group discussion with PowerPoint presentation and patient guidebook. To enhance the learning environment the use of posters, models and videos may be added. Patients will review and discuss the Pritikin label reading guidelines presented in Pritikin's Label Reading Educational series video. Using fool labels brought in from local grocery stores and markets, patients will apply the label reading guidelines and determine if the packaged food meet the Pritikin guidelines. The purpose of this lesson is to provide patients with the opportunity to review, discuss, and practice hands-on learning of the Pritikin Label Reading guidelines with actual packaged food labels. Cooking School  Pritikin's [employer] are designed to teach patients ways to prepare quick, simple, and affordable recipes at home. The importance of nutrition's role in chronic disease risk reduction is reflected in its emphasis in the overall Pritikin program. By learning how to prepare essential core Pritikin Eating Plan recipes, patients will increase control over what they eat; be able to customize the flavor of foods without the use of added salt, sugar, or fat; and improve the quality of the food they consume. By learning a set of core recipes which are easily assembled, quickly prepared, and affordable, patients are more likely to prepare more healthy foods at home. These workshops focus on convenient  breakfasts, simple entres, side dishes, and desserts which can be prepared with minimal effort and are consistent with nutrition recommendations for cardiovascular risk reduction. Cooking [employer] are taught by a Armed forces logistics/support/administrative officer (RD) who has been trained by the [employer]. The chef or RD has a clear understanding of the importance of minimizing - if not completely eliminating -  added fat, sugar, and sodium in recipes. Throughout the series of Cooking School Workshop sessions, patients will learn about healthy ingredients and efficient methods of cooking to build confidence in their capability to prepare    Cooking School weekly topics:  Adding Flavor- Sodium-Free  Fast and Healthy Breakfasts  Powerhouse Plant-Based Proteins  Satisfying Salads and Dressings  Simple Sides and Sauces  International Cuisine-Spotlight on the [employer] Zones  Delicious Desserts  Savory Soups  [employer] - Meals in a Astronomer Appetizers and Snacks  Comforting Weekend Breakfasts  One-Pot Wonders   Fast Evening Meals  Landscape architect Your Pritikin Plate  WORKSHOPS   Healthy Mindset (Psychosocial):  Focused Goals, Sustainable Changes Clinical staff led group instruction and group discussion with PowerPoint presentation and patient guidebook. To enhance the learning environment the use of posters, models and videos may be added. Patients will be able to apply effective goal setting strategies to establish at least one personal goal, and then take consistent, meaningful action toward that goal. They will learn to identify common barriers to achieving personal goals and develop strategies to overcome them. Patients will also gain an understanding of how our mind-set can impact our ability to achieve goals and the importance of cultivating a positive and growth-oriented mind-set. The purpose of this lesson is to provide patients with a deeper understanding of how to set and achieve personal goals, as well as the tools and strategies needed to overcome common obstacles which may arise along the way.  From Head to Heart: The Power of a Healthy Outlook  Clinical staff led group instruction and group discussion with PowerPoint presentation and patient guidebook. To enhance the learning environment the use of posters, models and videos may be added. Patients will be able to  recognize and describe the impact of emotions and mood on physical health. They will discover the importance of self-care and explore self-care practices which may work for them. Patients will also learn how to utilize the 4 C's to cultivate a healthier outlook and better manage stress and challenges. The purpose of this lesson is to demonstrate to patients how a healthy outlook is an essential part of maintaining good health, especially as they continue their cardiac rehab journey.  Healthy Sleep for a Healthy Heart Clinical staff led group instruction and group discussion with PowerPoint presentation and patient guidebook. To enhance the learning environment the use of posters, models and videos may be added. At the conclusion of this workshop, patients will be able to demonstrate knowledge of the importance of sleep to overall health, well-being, and quality of life. They will understand the symptoms of, and treatments for, common sleep disorders. Patients will also be able to identify daytime and nighttime behaviors which impact sleep, and they will be able to apply these tools to help manage sleep-related challenges. The purpose of this lesson is to provide patients with a general overview of sleep and outline the importance of quality sleep. Patients will learn about a few of the most common sleep disorders. Patients will also be introduced to the concept of "sleep hygiene," and  discover ways to self-manage certain sleeping problems through simple daily behavior changes. Finally, the workshop will motivate patients by clarifying the links between quality sleep and their goals of heart-healthy living.   Recognizing and Reducing Stress Clinical staff led group instruction and group discussion with PowerPoint presentation and patient guidebook. To enhance the learning environment the use of posters, models and videos may be added. At the conclusion of this workshop, patients will be able to understand the  types of stress reactions, differentiate between acute and chronic stress, and recognize the impact that chronic stress has on their health. They will also be able to apply different coping mechanisms, such as reframing negative self-talk. Patients will have the opportunity to practice a variety of stress management techniques, such as deep abdominal breathing, progressive muscle relaxation, and/or guided imagery.  The purpose of this lesson is to educate patients on the role of stress in their lives and to provide healthy techniques for coping with it.  Learning Barriers/Preferences:  Learning Barriers/Preferences - 12/07/22 1536       Learning Barriers/Preferences   Learning Barriers Sight   wears glasses   Learning Preferences Audio;Computer/Internet;Group Instruction;Individual Instruction;Pictoral;Skilled Demonstration;Verbal Instruction;Video;Written Material             Education Topics:  Knowledge Questionnaire Score:  Knowledge Questionnaire Score - 12/07/22 1536       Knowledge Questionnaire Score   Pre Score 21/23             Core Components/Risk Factors/Patient Goals at Admission:  Personal Goals and Risk Factors at Admission - 12/07/22 1538       Core Components/Risk Factors/Patient Goals on Admission   Hypertension Yes    Intervention Provide education on lifestyle modifcations including regular physical activity/exercise, weight management, moderate sodium restriction and increased consumption of fresh fruit, vegetables, and low fat dairy, alcohol moderation, and smoking cessation.;Monitor prescription use compliance.    Expected Outcomes Short Term: Continued assessment and intervention until BP is < 140/46mm HG in hypertensive participants. < 130/57mm HG in hypertensive participants with diabetes, heart failure or chronic kidney disease.;Long Term: Maintenance of blood pressure at goal levels.    Lipids Yes    Intervention Provide education and support for  participant on nutrition & aerobic/resistive exercise along with prescribed medications to achieve LDL 70mg , HDL >40mg .    Expected Outcomes Short Term: Participant states understanding of desired cholesterol values and is compliant with medications prescribed. Participant is following exercise prescription and nutrition guidelines.;Long Term: Cholesterol controlled with medications as prescribed, with individualized exercise RX and with personalized nutrition plan. Value goals: LDL < 70mg , HDL > 40 mg.    Stress Yes    Intervention Offer individual and/or small group education and counseling on adjustment to heart disease, stress management and health-related lifestyle change. Teach and support self-help strategies.;Refer participants experiencing significant psychosocial distress to appropriate mental health specialists for further evaluation and treatment. When possible, include family members and significant others in education/counseling sessions.    Expected Outcomes Short Term: Participant demonstrates changes in health-related behavior, relaxation and other stress management skills, ability to obtain effective social support, and compliance with psychotropic medications if prescribed.;Long Term: Emotional wellbeing is indicated by absence of clinically significant psychosocial distress or social isolation.             Core Components/Risk Factors/Patient Goals Review:   Goals and Risk Factor Review     Row Name 12/14/22 [phone] 12/29/22 1459 01/25/23 0758 02/23/23 [phone]  Core Components/Risk Factors/Patient Goals Review   Personal Goals Review Weight Management/Obesity;Stress;Hypertension;Lipids Weight Management/Obesity;Stress;Hypertension;Lipids Weight Management/Obesity;Stress;Hypertension;Lipids Weight Management/Obesity;Stress;Hypertension;Lipids    Review Keilin started cardiac rehab on 12/13/22 and did well with exercise. Vital signs were stable Amaziah started cardiac rehab on  12/13/22 and has been doing well with exercise. Vital signs remain stable. No further reports of feeling lightheaded since Imdur placed on hold Kimara started cardiac rehab on 12/13/22 and has been doing well with exercise. Vital signs remain stable. Verlia's Imdur was restarted as Brynn had some reoccurring chest pressure. Byrdie reports having a mild intermittent headache. Junella's exercise is currently on hold.    Expected Outcomes Tiasia will continue to participate in intensive cardiac rehab for exercise, nutrition and lifestyle modifications Tineka will continue to participate in intensive cardiac rehab for exercise, nutrition and lifestyle modifications Brinae will continue to participate in intensive cardiac rehab for exercise, nutrition and lifestyle modifications Alise will continue to participate in intensive cardiac rehab for exercise, nutrition and lifestyle modifications             Core Components/Risk Factors/Patient Goals at Discharge (Final Review):   Goals and Risk Factor Review - 02/23/23 0953       Core Components/Risk Factors/Patient Goals Review   Personal Goals Review Weight Management/Obesity;Stress;Hypertension;Lipids    Review Ellamay's exercise is currently on hold.    Expected Outcomes Aybree will continue to participate in intensive cardiac rehab for exercise, nutrition and lifestyle modifications             ITP Comments:  ITP Comments     Row Name 12/07/22 1519 12/14/22 0912 12/29/22 1456 01/25/23 0751 02/23/23 0950   ITP Comments Armanda Magic, MD: Medical Director.  Introduction to the [employer] / Intensive Cardiac Rehab.  Initial orientation packet reviewed with the patient. 30 Day ITP Review. Aashi started cardiac rehab on 12/13/22 and did well with exercise. 30 Day ITP Review. Balee is doing well with exercise at  cardiac rehab. Coleta has not reported having any more lightheadedness since Imdur placed on hold 30 Day ITP Review. Kentrell has good  attendance and participation with exercise at  cardiac rehab. Ileigh's Imdur was restarted 30 Day ITP Review. Raphaella's exercise is currently on hold pending clearance from cardiology as Mela contiunues to experience chest tightness with exertion            Comments: See ITP comments. Exercise is on hold.Thayer Headings RN BSN

## 2023-02-23 NOTE — Patient Instructions (Signed)
Medication Instructions:  INCREASE ISOSORBIDE MONONITRATE TO 30 MG TABLET DAILY.  DECREASE OLMESARTAN TO 10 MG DAILY.(CUT TABLET IN 1/2)   *If you need a refill on your cardiac medications before your next appointment, please call your pharmacy*   Lab Work: NO LABS If you have labs (blood work) drawn today and your tests are completely normal, you will receive your results only by: MyChart Message (if you have MyChart) OR A paper copy in the mail If you have any lab test that is abnormal or we need to change your treatment, we will call you to review the results.   Testing/Procedures:[facility] [address] Provider has ordered for you to have PET stress test. PLEASE READ INSTRUCTIONS GIVEN BELOW.   Follow-Up: At [facility], you and your health needs are our priority.  As part of our continuing mission to provide you with exceptional heart care, we have created designated Provider Care Teams.  These Care Teams include your primary Cardiologist (physician) and Advanced Practice Providers (APPs -  Physician Assistants and Nurse Practitioners) who all work together to provide you with the care you need, when you need it.    Your next appointment:   4 week(s)  Provider:   Azalee Course, PA    Other Instructions How to Prepare for Your Cardiac PET/CT Stress Test:  1. Please do not take these medications before your test:   Medications that may interfere with the cardiac pharmacological stress agent (ex. nitrates - including erectile dysfunction medications, isosorbide mononitrate, tamulosin or beta-blockers) the day of the exam. (Erectile dysfunction medication should be held for at least 72 hrs prior to test) Theophylline containing medications for 12 hours. Dipyridamole 48 hours prior to the test. Your remaining medications may be taken with water.  2. Nothing to eat or drink, except water, 3 hours prior to arrival time.   NO caffeine/decaffeinated  products, or chocolate 12 hours prior to arrival.  3. NO perfume, cologne or lotion  4. Total time is 1 to 2 hours; you may want to bring reading material for the waiting time.  5. Please report to Radiology at the [facility] Main Entrance 30 minutes early for your test.  [address] [place], [place] [address]   Diabetic Preparation:  Hold oral medications. You may take NPH and Lantus insulin. Do not take Humalog or Humulin R (Regular Insulin) the day of your test. Check blood sugars prior to leaving the house. If able to eat breakfast prior to 3 hour fasting, you may take all medications, including your insulin, Do not worry if you miss your breakfast dose of insulin - start at your next meal.  IF YOU THINK YOU MAY BE PREGNANT, OR ARE NURSING PLEASE INFORM THE TECHNOLOGIST.  In preparation for your appointment, medication and supplies will be purchased.  Appointment availability is limited, so if you need to cancel or reschedule, please call the Radiology Department at [phone] [facility])24 hours in advance to avoid a cancellation fee of $100.00  What to Expect After you Arrive:  Once you arrive and check in for your appointment, you will be taken to a preparation room within the Radiology Department.  A technologist or Nurse will obtain your medical history, verify that you are correctly prepped for the exam, and explain the procedure.  Afterwards,  an IV will be started in your arm and electrodes will be placed on your skin for EKG monitoring during the stress portion of  the exam. Then you will be escorted to the PET/CT scanner.  There, staff will get you positioned on the scanner and obtain a blood pressure and EKG.  During the exam, you will continue to be connected to the EKG and blood pressure machines.  A small, safe amount of a radioactive tracer will be injected in your IV to obtain a series of pictures of your heart along with an injection of a stress agent.     After your Exam:  It is recommended that you eat a meal and drink a caffeinated beverage to counter act any effects of the stress agent.  Drink plenty of fluids for the remainder of the day and urinate frequently for the first couple of hours after the exam.  Your doctor will inform you of your test results within 7-10 business days.  For more information and frequently asked questions, please visit our website : [email or web address]  For questions about your test or how to prepare for your test, please call: Cardiac Imaging Nurse [facility]igators Office: [phone]

## 2023-02-25 ENCOUNTER — Encounter (HOSPITAL_COMMUNITY): Payer: Medicare PPO

## 2023-02-28 ENCOUNTER — Encounter (HOSPITAL_COMMUNITY): Payer: Medicare PPO

## 2023-03-02 ENCOUNTER — Encounter (HOSPITAL_COMMUNITY): Payer: Medicare PPO

## 2023-03-04 ENCOUNTER — Encounter (HOSPITAL_COMMUNITY): Payer: Medicare PPO

## 2023-03-07 ENCOUNTER — Encounter (HOSPITAL_COMMUNITY): Payer: Self-pay

## 2023-03-09 ENCOUNTER — Encounter (HOSPITAL_COMMUNITY)
Admission: RE | Admit: 2023-03-09 | Discharge: 2023-03-09 | Disposition: A | Discharge status: Home / Self Care | Payer: Medicare PPO | Source: Ambulatory Visit | Attending: Physician Assistant | Admitting: Physician Assistant

## 2023-03-09 DIAGNOSIS — K8020 Calculus of gallbladder without cholecystitis without obstruction: Secondary | ICD-10-CM | POA: Diagnosis not present

## 2023-03-09 DIAGNOSIS — R079 Chest pain, unspecified: Secondary | ICD-10-CM | POA: Insufficient documentation

## 2023-03-09 DIAGNOSIS — R918 Other nonspecific abnormal finding of lung field: Secondary | ICD-10-CM | POA: Diagnosis not present

## 2023-03-09 DIAGNOSIS — I700 Atherosclerosis of aorta: Secondary | ICD-10-CM | POA: Diagnosis not present

## 2023-03-09 LAB — NM PET CT CARDIAC PERFUSION MULTI W/ABSOLUTE BLOODFLOW
Nuc Rest EF: 56 %
Nuc Stress EF: 63 %
Rest Nuclear Isotope Dose: 16.1 mCi
ST Depression (mm): 0 mm
TID: 1.14

## 2023-03-09 MED ORDER — RUBIDIUM RB82 GENERATOR (RUBYFILL)
16.1100 | PACK | Freq: Once | INTRAVENOUS | Status: AC
  Administered 2023-03-09: 16.11 via INTRAVENOUS

## 2023-03-09 MED ORDER — RUBIDIUM RB82 GENERATOR (RUBYFILL)
16.2800 | PACK | Freq: Once | INTRAVENOUS | Status: AC
  Administered 2023-03-09: 16.28 via INTRAVENOUS

## 2023-03-09 MED ORDER — REGADENOSON 0.4 MG/5ML IV SOLN
INTRAVENOUS | Status: AC
  Filled 2023-03-09: qty 5

## 2023-03-09 MED ORDER — REGADENOSON 0.4 MG/5ML IV SOLN
0.4000 mg | Freq: Once | INTRAVENOUS | Status: AC
  Administered 2023-03-09: 0.4 mg via INTRAVENOUS

## 2023-03-10 ENCOUNTER — Encounter: Payer: Self-pay | Admitting: Cardiovascular Disease

## 2023-03-10 ENCOUNTER — Telehealth: Payer: Self-pay | Admitting: Cardiovascular Disease

## 2023-03-10 NOTE — Telephone Encounter (Signed)
Called pt and let her know that the provider hasn't sent a message with her results yet. Advised pt someone will call her when the results are available.

## 2023-03-10 NOTE — Telephone Encounter (Signed)
error 

## 2023-03-10 NOTE — Telephone Encounter (Signed)
Patient called in requesting update on results. Please advise.

## 2023-03-11 ENCOUNTER — Telehealth: Payer: Self-pay | Admitting: Physician Assistant

## 2023-03-11 NOTE — Addendum Note (Signed)
Encounter addended by: [place], Jasiyah Poland G, RD on: 03/11/2023 3:52 PM  Actions taken: Flowsheet data copied forward, Flowsheet accepted

## 2023-03-11 NOTE — Telephone Encounter (Deleted)
Called and left VM for patient to call back to schedule sooner follow up visit than 10/23 @ 220pm due to abnormal stress test. Per secure chat patient can see an APP before then or fu with Meng on 03/18/23 in a PV slot.

## 2023-03-11 NOTE — Telephone Encounter (Addendum)
Called and left VM for patient to call back to schedule sooner follow up visit than 10/23 @ 220pm due to abnormal stress test. Per secure chat patient can see an APP before then or fu with Meng on 03/18/23 in a PV slot.

## 2023-03-17 NOTE — Progress Notes (Signed)
Cardiology Office Note    Date:  03/18/2023  ID:  Tiffany Mayo, DOB [birth year], MRN [id number] PCP:  Gaspar Garbe, MD  Cardiologist:  Reatha Harps, MD  Electrophysiologist:  None   Chief Complaint: Follow up for CAD   History of Present Illness: .    Tiffany Mayo is a 82 y.o. female with visit-pertinent history of CAD, hyperlipidemia and history of TIA.  On 07/01/2022 she underwent coronary CTA that demonstrated significant two-vessel disease.  Cardiac catheterization performed 07/08/2022 demonstrated 80% RV branch lesion, 80% D1 lesion, 95% D2 lesion, 80% proximal to mid LAD lesion, 60% proximal LAD lesion, 80% proximal to mid left circumflex lesion, 90% OM2 lesion, EF normal.  Patient opted to undergo PCI instead of being considered for CABG.  Repeat coronary catheterization performed on 07/21/2022 demonstrated severe mid LAD lesion treated with PTCA and stenting using 2.75 x 16 mm Synergy DES, severe mid left circumflex and first OM lesion treated with single 2.25 x 28 mm Synergy DES.  Postprocedure patient was placed on aspirin and Plavix with plan to run the dual antiplatelet therapy for minimum 6 months.  In March 2024 she was seen in [facility] emergency room in [place] for possible TIA versus seizure disorder.  CT showed no acute abnormality of the brain, no intracranial bleeding, small vessel occlusive disease in the periventricular white matter of the cerebral hemispheres, no masses.  MRI of the brain was negative for acute changes, she was placed on Keppra.  She was seen by Dr. Bufford Buttner for follow-up in June 2024, she is doing well at that time.  She noted some low energy and fatigue that was felt to be side effect of medications, she has weaned off of her Imdur.   On 01/11/2023 she was seen by Edd Fabian, NP, she reported intermittent burning sensation in her chest since stopping Imdur.  She was placed back on 15 mg daily of Imdur.  Repeat echocardiogram  obtained on 01/18/2023 demonstrated EF 60 to 65%, no RWMA, normal RV, mild to moderate AI, mild aortic stenosis.  Per cardiac rehab notes, on 02/07/2023 for patient had 6 minutes of chest discomfort with exercise.  She was last seen in clinic on 02/23/2023 where she reported that for the past 6 weeks she had continued to have some chest discomfort.  Her olmesartan was decreased down to 10 mg daily and her Imdur was increased to 30 mg daily.  It was recommended that she obtain a PET stress test to assess for any large area of ischemia with plan that if there was only a small area of ischemia near the diagonal artery territory then would likely pursue medical management.  However if there was a large area of ischemia may need to consider repeat PCI.  PET stress on 03/09/2023 indicated reversible perfusion defect consistent with ischemia in apical to mid anterior and apical lateral wall, 3 times daily was present, finding consistent with LAD territory ischemia, noted that she has known severe diagonal stenosis, which could be the cause of perfusion defect however cardiac catheterization was recommended in setting of high risk findings.  Today she presents for follow-up.  She reports that she has noted some improvement since increase in Imdur. She has had four episodes of chest discomfort, described as a heaviness, this past week during exertion, longest lasting 30 mins, relieves with rest. She has not taken any sublingual nitroglycerin. Notes some fatigue that she associates with medications.  She feels that she sleeps frequently, but notes that she has not been trying to be very active.  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  ROS: .   Today she denies shortness of breath, lower extremity edema, fatigue, palpitations, melena, hematuria, hemoptysis, diaphoresis, weakness, presyncope,  syncope, orthopnea, and PND.  All other systems are reviewed and otherwise negative. Studies Reviewed: Marland Kitchen    EKG:  EKG is ordered today, personally reviewed, demonstrating  EKG Interpretation Date/Time:  Friday March 18 2023 15:57:59 EDT Ventricular Rate:  56 PR Interval:  186 QRS Duration:  74 QT Interval:  434 QTC Calculation: 418 R Axis:   -7  Text Interpretation: Sinus bradycardia Low voltage QRS No significant change was found Confirmed by Reather Littler [phone]) on 03/18/2023 4:23:05 PM    CV Studies:  Cardiac Studies & Procedures   CARDIAC CATHETERIZATION  CARDIAC CATHETERIZATION 07/21/2022  Narrative Severe mid LAD stenosis, treated successfully with PTCA and stenting using a 2.75 x 16 mm Synergy DES  Severe mid circumflex/first OM sequential stenoses, treated successfully with a single stent covering both lesions (2.5 x 28 mm Synergy DES)  Recommendations: Overnight observation, DAPT with aspirin and clopidogrel 6 months without interruption.  Findings Coronary Findings Diagnostic  Dominance: Right  Left Anterior Descending Prox LAD lesion is 60% stenosed. Prox LAD to Mid LAD lesion is 80% stenosed.  First Diagonal Branch Vessel is small in size. 1st Diag-1 lesion is 80% stenosed. 1st Diag-2 lesion is 95% stenosed.  Left Circumflex Prox Cx to Mid Cx lesion is 80% stenosed.  Second Obtuse Marginal Branch 2nd Mrg lesion is 90% stenosed.  Right Coronary Artery Prox RCA lesion is 30% stenosed.  Right Ventricular Branch Vessel is small in size. RV Branch lesion is 80% stenosed.  Intervention  Prox LAD to Mid LAD lesion Stent CATH LAUNCHER 6FR EBU3.5 guide catheter was inserted. Lesion crossed with guidewire using a WIRE COUGAR XT STRL 190CM. Pre-stent angioplasty was performed using a BALLN EMERGE MR 2.5X15. Maximum pressure:  12 atm. A drug-eluting stent was successfully placed using a SYNERGY XD 2.75X16. Maximum pressure: 16 atm. Post-stent angioplasty was  performed using a BALLN [place] EMERGE MR 3.0X12. Maximum pressure:  18 atm. Post-Intervention Lesion Assessment The intervention was successful. Pre-interventional TIMI flow is 3. Post-intervention TIMI flow is 3. No complications occurred at this lesion. There is a 0% residual stenosis post intervention.  Prox Cx to Mid Cx lesion Stent CATH LAUNCHER 6FR EBU3.5 guide catheter was inserted. Lesion crossed with guidewire using a WIRE COUGAR XT STRL 190CM. Pre-stent angioplasty was performed using a BALLN EMERGE MR 2.5X15. A drug-eluting stent was successfully placed using a SYNERGY XD 2.50X28. Maximum pressure: 12 atm. Post-stent angioplasty was performed using a BALLN [place] EMERGE MR 3.0X12. Maximum pressure:  18 atm. Both lesions are predilated and then covered with a single 2.5 x 28 mm Synergy DES.  The distal portion of the stent and the smaller caliber OM is postdilated with a 3.0 mm [place] balloon to 12 atm.  The more proximal aspects of the stent are dilated to 18 atm. Post-Intervention Lesion Assessment The intervention was successful. Pre-interventional TIMI flow is 3. Post-intervention TIMI flow is 3. No complications occurred at this lesion. There is a 0% residual stenosis post intervention.  2nd Mrg lesion Stent A drug-eluting stent was successfully placed using a SYNERGY XD 2.50X28. A  single stent is used to cover both lesions. Post-Intervention Lesion Assessment The intervention was successful. Pre-interventional TIMI flow is 3. Post-intervention TIMI flow is 3. No complications occurred at this lesion. There is a 0% residual stenosis post intervention.   CARDIAC CATHETERIZATION  CARDIAC CATHETERIZATION 07/08/2022  Narrative   Prox RCA lesion is 30% stenosed.   RV Branch lesion is 80% stenosed.   1st Diag-1 lesion is 80% stenosed.   1st Diag-2 lesion is 95% stenosed.   Prox LAD to Mid LAD lesion is 80% stenosed.   Prox LAD lesion is 60% stenosed.   Prox Cx to Mid Cx lesion is 80%  stenosed.   2nd Mrg lesion is 90% stenosed.   The left ventricular systolic function is normal.   LV end diastolic pressure is normal.  Multivessel coronary artery disease with smooth 60% proximal LAD stenosis before the first diagonal vessel with 80% ostial and 95% stenosis in a small first diagonal vessel and 80% stenosis in the mid LAD; 80% proximal circumflex stenosis with 90% stenosis in the proximal mid OM branch; dominant RCA with smooth 30% mid stenosis and 80% stenosis in a small RV marginal branch.  Low normal LV function with EF estimated at 50 to 55%.  LVEDP 15 mm.  There is a subtle area of distal inferior hypocontractility.  RECOMMENDATION: Consider initial surgical consultation with diffuse disease in the LAD and circumflex system.  Will add isosorbide to the patient's metoprolol.  If against surgical revascularization can consider PCI to the LAD, circumflex, and circumflex marginal vessels.  Rest of lipid-lowering therapy with target LDL less than 70.  Findings Coronary Findings Diagnostic  Dominance: Right  Left Anterior Descending Prox LAD lesion is 60% stenosed. Prox LAD to Mid LAD lesion is 80% stenosed.  First Diagonal Branch Vessel is small in size. 1st Diag-1 lesion is 80% stenosed. 1st Diag-2 lesion is 95% stenosed.  Left Circumflex Prox Cx to Mid Cx lesion is 80% stenosed.  Second Obtuse Marginal Branch 2nd Mrg lesion is 90% stenosed.  Right Coronary Artery Prox RCA lesion is 30% stenosed.  Right Ventricular Branch Vessel is small in size. RV Branch lesion is 80% stenosed.  Intervention  No interventions have been documented.   STRESS TESTS  NM PET CT CARDIAC PERFUSION MULTI W/ABSOLUTE BLOODFLOW 03/09/2023  Narrative   Reversible perfusion defect consistent with ischemia in apical to mid anterior and apical lateral wall.  Myocardial blood flow reserve unreliable in setting of prior PCI.   TID is present (1.14). Findings consistent with LAD  territory ischemia; does have known severe diagonal stenosis, which could be the cause of perfusion defect, but in setting of high risk finding (TID), would consider cardiac catheterization   LV perfusion is abnormal. There is evidence of ischemia. Defect 1: There is a medium defect with moderate reduction in uptake present in the apical to mid anterior and anterolateral location(s) that is reversible. There is normal wall motion in the defect area. Consistent with ischemia.   Rest left ventricular function is normal. Rest EF: 56%. Stress left ventricular function is normal. Stress EF: 63%. End diastolic cavity size is normal. End systolic cavity size is normal.   Myocardial blood flow reserve is not reported in this patient due to technical or patient-specific concerns that affect accuracy (prior PCI)   Coronary calcium assessment not performed due to prior revascularization.   Findings are consistent with ischemia. The study is high risk.   Electronically signed by Epifanio Lesches, MD  CLINICAL  DATA:  This over-read does not include interpretation of cardiac or coronary anatomy or pathology. The interpretation by the cardiologist is attached.  COMPARISON:  None Available.  FINDINGS: Scout view is unremarkable. Atherosclerotic calcification of the aorta. Vague low-attenuation in the region of the falciform ligament of the liver (3/63). Gallstone. A few scattered pulmonary nodules measure up to 3 mm in the anterior segment right upper lobe (4/14).  IMPRESSION: 1. Pulmonary nodules measure 4 mm or less in size. No follow-up needed if patient is low-risk (and has no known or suspected primary neoplasm). Non-contrast chest CT can be considered in 12 months if patient is high-risk. This recommendation follows the consensus statement: Guidelines for Management of Incidental Pulmonary Nodules Detected on CT Images: From the Fleischner Society 2017; Radiology 2017; 284:228-243. 2. Vague  low-attenuation in the region of the falciform ligament of the liver may be due to fat deposition. Evaluation is limited due to technique and lack of postcontrast imaging. Consider CT or MR abdomen without and with contrast in further evaluation. If a less aggressive approach is desired, initial evaluation with ultrasound could be performed. 3. Cholelithiasis. 4.  Aortic atherosclerosis (ICD10-I70.0).   Electronically Signed By: Leanna Battles M.D. On: 03/09/2023 11:47   ECHOCARDIOGRAM  ECHOCARDIOGRAM COMPLETE 01/18/2023  Narrative ECHOCARDIOGRAM REPORT    Patient Name:   Tiffany Mayo Date of Exam: 01/18/2023 Medical Rec #:  [id number]      Height:       63.0 in Accession #:    [id number]     Weight:       136.8 lb Date of Birth:  11-May-1941      BSA:          1.645 m Patient Age:    82 years       BP:           110/62 mmHg Patient Gender: F              HR:           55 bpm. Exam Location:  [address]  Procedure: 2D Echo, 3D Echo, Cardiac Doppler, Color Doppler and Strain Analysis  Indications:    I25.10 CAD  History:        Patient has prior history of Echocardiogram examinations, most recent 04/08/2022. CAD, Arrythmias:Bradycardia, Signs/Symptoms:Chest Pain and Dizziness/Lightheadedness; Risk Factors:Family History of Coronary Artery Disease, Hypertension and Dyslipidemia. Headaches.  Sonographer:    Farrel Conners RDCS Referring Phys: Thomasene Ripple CLEAVER  IMPRESSIONS   1. Left ventricular ejection fraction, by estimation, is 60 to 65%. The left ventricle has normal function. The left ventricle has no regional wall motion abnormalities. Left ventricular diastolic parameters were normal. The average left ventricular global longitudinal strain is -19.7 %. The global longitudinal strain is normal. 2. Right ventricular systolic function is normal. The right ventricular size is normal. 3. The mitral valve is normal in structure. No evidence of mitral valve  regurgitation. No evidence of mitral stenosis. 4. The aortic valve is calcified. There is mild calcification of the aortic valve. There is mild thickening of the aortic valve. Aortic valve regurgitation is mild to moderate. Mild aortic valve stenosis. Aortic regurgitation PHT measures 626 msec. Aortic valve area, by VTI measures 1.49 cm. Aortic valve mean gradient measures 8.7 mmHg. Aortic valve Vmax measures 2.09 m/s. 5. The inferior vena cava is normal in size with greater than 50% respiratory variability, suggesting right atrial pressure of 3 mmHg.  FINDINGS Left Ventricle: Left ventricular ejection fraction,  by estimation, is 60 to 65%. The left ventricle has normal function. The left ventricle has no regional wall motion abnormalities. The average left ventricular global longitudinal strain is -19.7 %. The global longitudinal strain is normal. The left ventricular internal cavity size was normal in size. There is no left ventricular hypertrophy. Left ventricular diastolic parameters were normal.  Right Ventricle: The right ventricular size is normal. No increase in right ventricular wall thickness. Right ventricular systolic function is normal.  Left Atrium: Left atrial size was normal in size.  Right Atrium: Right atrial size was normal in size.  Pericardium: There is no evidence of pericardial effusion. Presence of epicardial fat layer.  Mitral Valve: The mitral valve is normal in structure. No evidence of mitral valve regurgitation. No evidence of mitral valve stenosis.  Tricuspid Valve: The tricuspid valve is normal in structure. Tricuspid valve regurgitation is trivial. No evidence of tricuspid stenosis.  Aortic Valve: The aortic valve is calcified. There is mild calcification of the aortic valve. There is mild thickening of the aortic valve. There is moderate aortic valve annular calcification. Aortic valve regurgitation is mild to moderate. Aortic regurgitation PHT measures 626  msec. Mild aortic stenosis is present. Aortic valve mean gradient measures 8.7 mmHg. Aortic valve peak gradient measures 17.5 mmHg. Aortic valve area, by VTI measures 1.49 cm.  Pulmonic Valve: The pulmonic valve was normal in structure. Pulmonic valve regurgitation is not visualized. No evidence of pulmonic stenosis.  Aorta: The aortic root is normal in size and structure.  Venous: The inferior vena cava is normal in size with greater than 50% respiratory variability, suggesting right atrial pressure of 3 mmHg.  IAS/Shunts: No atrial level shunt detected by color flow Doppler.   LEFT VENTRICLE PLAX 2D LVIDd:         4.10 cm   Diastology LVIDs:         2.30 cm   LV e' medial:    6.53 cm/s LV PW:         1.00 cm   LV E/e' medial:  10.5 LV IVS:        0.70 cm   LV e' lateral:   6.09 cm/s LVOT diam:     2.00 cm   LV E/e' lateral: 11.3 LV SV:         74 LV SV Index:   45        2D Longitudinal Strain LVOT Area:     3.14 cm  2D Strain GLS (A2C):   -19.9 % 2D Strain GLS (A3C):   -18.8 % 2D Strain GLS (A4C):   -20.3 % 2D Strain GLS Avg:     -19.7 %  3D Volume EF: 3D EF:        71 % LV EDV:       94 ml LV ESV:       27 ml LV SV:        66 ml  RIGHT VENTRICLE RV Basal diam:  3.30 cm RV S prime:     14.40 cm/s TAPSE (M-mode): 2.2 cm RVSP:           13.8 mmHg  LEFT ATRIUM             Index        RIGHT ATRIUM           Index LA diam:        3.30 cm 2.01 cm/m   RA Pressure: 3.00 mmHg LA Vol (A2C):  34.9 ml 21.21 ml/m  RA Area:     13.20 cm LA Vol (A4C):   39.1 ml 23.76 ml/m  RA Volume:   30.40 ml  18.48 ml/m LA Biplane Vol: 39.0 ml 23.70 ml/m AORTIC VALVE AV Area (Vmax):    1.46 cm AV Area (Vmean):   1.43 cm AV Area (VTI):     1.49 cm AV Vmax:           209.00 cm/s AV Vmean:          134.333 cm/s AV VTI:            0.499 m AV Peak Grad:      17.5 mmHg AV Mean Grad:      8.7 mmHg LVOT Vmax:         97.20 cm/s LVOT Vmean:        61.300 cm/s LVOT VTI:          0.236  m LVOT/AV VTI ratio: 0.47 AI PHT:            626 msec  AORTA Ao Root diam: 2.80 cm Ao Asc diam:  3.30 cm  MITRAL VALVE               TRICUSPID VALVE MV Area (PHT)  cm         TR Peak grad:   10.8 mmHg MV Decel Time: 220 msec    TR Vmax:        164.00 cm/s MV E velocity: 68.65 cm/s  Estimated RAP:  3.00 mmHg MV A velocity: 71.50 cm/s  RVSP:           13.8 mmHg MV E/A ratio:  0.96 SHUNTS Systemic VTI:  0.24 m Systemic Diam: 2.00 cm  Kardie Tobb DO Electronically signed by Thomasene Ripple DO Signature Date/Time: 01/18/2023/12:03:50 PM    Final     CT SCANS  CT CORONARY MORPH W/CTA COR W/SCORE 07/01/2022  Addendum 07/02/2022  8:32 PM ADDENDUM REPORT: 07/02/2022 20:29  EXAM: OVER-READ INTERPRETATION  CT CHEST  The following report is an over-read performed by radiologist Dr. Curly [facility] on 07/02/2022. This over-read does not include interpretation of cardiac or coronary anatomy or pathology. The coronary CTA interpretation by the cardiologist is attached.  COMPARISON:  None.  FINDINGS: No filling defects identified in pulmonary arteries to indicate PE. Dependent bibasilar subsegmental atelectasis. No pulmonary edema or evidence of pneumonia. No suspicious adenopathy identified. No pneumothorax or pleural effusion. Imaged osseous structures are intact. Imaged portions of the upper abdomen were unremarkable. Partially imaged lumbar spine fixation hardware noted on the scout view.  IMPRESSION: Dependent bibasilar subsegmental atelectasis. Otherwise no significant extracardiac findings identified.   Electronically Signed By: Layla Maw M.D. On: 07/02/2022 20:29  Narrative CLINICAL DATA:  89F with hypertension, hyperlipidemia, TIA and chest pain.  EXAM: Cardiac/Coronary  CT  TECHNIQUE: The patient was scanned on a [employer].  FINDINGS: A 120 kV prospective scan was triggered in the descending thoracic aorta at 111  HU's. Axial non-contrast 3 mm slices were carried out through the heart. The data set was analyzed on a dedicated work station and scored using the Agatson method. Gantry rotation speed was 250 msecs and collimation was .6 mm. No beta blockade and 0.8 mg of sl NTG was given. The 3D data set was reconstructed in 5% intervals of the 67-82 % of the R-R cycle. Diastolic phases were analyzed on a dedicated work station using MPR, MIP and VRT modes.  The patient received 80 cc of contrast.  Aorta: Normal size. Ascending aorta 3.0 cm. Aortic atherosclerosis. No dissection.  Aortic Valve:  Trileaflet.  Aortic valve calcification.  Coronary Arteries:  Normal coronary origin.  Right dominance.  RCA is a large dominant artery that gives rise to PDA and PLVB. There is mild (25%) plaque in the proximal and mid RCA and moderate (50-69%) mixed plaque distally.  Left main is a large artery that gives rise to LAD and LCX arteries.  LAD is a large vessel that has minimal (<25%) calcified plaque proximally. There is moderate (50-69%) calcified plaque distal to D1 and severe (>70%) plaque distal to D2. D1 has moderate mixed plaque at the ostium.  LCX is a non-dominant artery tthat has mild (25-49%) mixed plaque proximally and severe (>70%) mixed plaque in the mid vessel.  Coronary Calcium Score:  Left main: 7.32  Left anterior descending artery: 317  Left circumflex artery: 47.6  Right coronary artery: 173  Total: 544  Percentile: 80th  Other findings:  Normal pulmonary vein drainage into the left atrium.  Normal let atrial appendage without a thrombus.  Normal size of the pulmonary artery.  IMPRESSION: 1. Coronary calcium score of 544. This was 80th percentile for age-, race-, and sex-matched controls.  2. Normal coronary origin with right dominance.  3. There is severe (>70%) plaque in the LCX and LAD. There is moderate (50-69%) stenosis in the RCA. CAD-RADS 4.  4.  Will  send study for FFRct.  5.  Aortic atherosclerosis.  6.  Aortic valve calcification.  Chilton Si, MD  Electronically Signed: By: Chilton Si M.D. On: 07/01/2022 17:51           Current Reported Medications:.    Current Meds  Medication Sig   acetaminophen (TYLENOL) 500 MG tablet Take 500-1,000 mg by mouth every 6 (six) hours as needed (pain.).   aspirin EC 81 MG tablet Take 1 tablet (81 mg total) by mouth daily. Swallow whole.   atorvastatin (LIPITOR) 80 MG tablet Take 80 mg by mouth in the morning.   clopidogrel (PLAVIX) 75 MG tablet Take 1 tablet (75 mg total) by mouth daily.   cycloSPORINE (RESTASIS) 0.05 % ophthalmic emulsion Place 1 drop into both eyes 2 (two) times daily.   escitalopram (LEXAPRO) 5 MG tablet Take 5 mg by mouth in the morning.   levETIRAcetam (KEPPRA XR) 500 MG 24 hr tablet Take 1 tablet (500 mg total) by mouth daily.   levothyroxine (SYNTHROID) 50 MCG tablet Take 50 mcg by mouth every other day.   levothyroxine (SYNTHROID) 75 MCG tablet Take 75 mcg by mouth every other day.   pantoprazole (PROTONIX) 40 MG tablet Take 1 tablet (40 mg total) by mouth in the morning.   [DISCONTINUED] isosorbide mononitrate (IMDUR) 30 MG 24 hr tablet Take 1 tablet (30 mg total) by mouth daily.   [DISCONTINUED] olmesartan (BENICAR) 20 MG tablet Take 0.5 tablets (10 mg total) by mouth daily.   Physical Exam:    VS:  BP 114/62 (BP Location: Left Arm, Patient Position: Sitting, Cuff Size: Normal)   Pulse (!) 56   Ht 5' 3.5" (1.613 m)   Wt 138 lb (62.6 kg)   SpO2 99%   BMI 24.06 kg/m    Wt Readings from Last 3 Encounters:  03/18/23 138 lb (62.6 kg)  02/23/23 138 lb (62.6 kg)  01/20/23 138 lb (62.6 kg)    GEN: Well nourished, well developed in no acute distress NECK: No JVD;  No carotid bruits CARDIAC: RRR, no murmurs, rubs, gallops RESPIRATORY:  Clear to auscultation without rales, wheezing or rhonchi  ABDOMEN: Soft, non-tender, non-distended EXTREMITIES:  No  edema; No acute deformity   Asessement and Plan:.    CAD/chest discomfort: Patient underwent PCI of mid LAD and mid left circumflex in 07/2022. PET stress indicated reversible perfusion defect consistent with ischemia in apical to mid anterior and apical lateral wall, 3 times daily was present, finding consistent with LAD territory ischemia, noted that she has known severe diagonal stenosis, which could be the cause of perfusion defect however cardiac catheterization was recommended in setting of high risk findings. Today patient reports improvement in her chest discomfort however she still is having episodes of chest pain that is relieved with rest, she has not taken sublingual nitroglycerin.  Reviewed case with Dr. Jacques Navy who felt PET stress results were likely related to diagonal stenosis, however was not unreasonable to repeat cardiac cath.  Discussed recommendations and findings with patient and her son.  Through shared decision making elected to increase her Imdur to 60 mg daily, and stop her olmesartan.  If patient has worsening fatigue or has increased chest discomfort plan will be to proceed with cardiac catheterization. Reviewed use of sublingual nitroglycerin and ED precautions. Continue aspirin, atorvastatin, Plavix and nitroglycerin as needed. Increase Imdur to 60 mg daily and stop Olmesartan.  Patient denies bleeding issues, plan to repeat CBC and BMET on follow up.   Hypertension: Blood pressure today 114/62. Continue antihypertensive regimen as noted above.   Hyperlipidemia: Last lipid profile on 11/17/2022 indicated total cholesterol 125, triglycerides 93, HDL 45 and LDL 62. Continue Lipitor.   History of TIA/Seizure: No recent recurrence.  Pulmonary nodules: Noted on Cardiac PET to have pulmonary nodules measuring 4 mm or less in size. Will have her complete chest CT in 12 months.   Liver attenuation opacity in liver: Noted on Cardiac PET will obtain liver ultrasound.   Disposition:  F/u with Dr. Val EagleJennette Kettle in December as planned or sooner if needed.   Signed, Rip Harbour, NP   Seen with Azalee Course, PA

## 2023-03-18 ENCOUNTER — Other Ambulatory Visit: Payer: Self-pay

## 2023-03-18 ENCOUNTER — Ambulatory Visit: Payer: Medicare PPO | Attending: Physician Assistant | Admitting: Cardiology

## 2023-03-18 ENCOUNTER — Encounter: Payer: Self-pay | Admitting: Physician Assistant

## 2023-03-18 VITALS — BP 114/62 | HR 56 | Ht 63.5 in | Wt 138.0 lb

## 2023-03-18 DIAGNOSIS — K7689 Other specified diseases of liver: Secondary | ICD-10-CM

## 2023-03-18 DIAGNOSIS — I25119 Atherosclerotic heart disease of native coronary artery with unspecified angina pectoris: Secondary | ICD-10-CM

## 2023-03-18 DIAGNOSIS — R079 Chest pain, unspecified: Secondary | ICD-10-CM

## 2023-03-18 DIAGNOSIS — E785 Hyperlipidemia, unspecified: Secondary | ICD-10-CM | POA: Diagnosis not present

## 2023-03-18 DIAGNOSIS — R918 Other nonspecific abnormal finding of lung field: Secondary | ICD-10-CM | POA: Diagnosis not present

## 2023-03-18 DIAGNOSIS — G459 Transient cerebral ischemic attack, unspecified: Secondary | ICD-10-CM | POA: Diagnosis not present

## 2023-03-18 MED ORDER — ISOSORBIDE MONONITRATE ER 60 MG PO TB24: 60.0000 mg | ORAL_TABLET | Freq: Every day | ORAL | 3 refills | Status: DC

## 2023-03-18 NOTE — Patient Instructions (Signed)
Medication Instructions:  STOP OLMESARTAN   INCREASE IMDUR TO 60 MG DAILY  *If you need a refill on your cardiac medications before your next appointment, please call your pharmacy*   Lab Work: NO LABS If you have labs (blood work) drawn today and your tests are completely normal, you will receive your results only by: MyChart Message (if you have MyChart) OR A paper copy in the mail If you have any lab test that is abnormal or we need to change your treatment, we will call you to review the results.   Testing/Procedures: NO TESTING   Follow-Up: At [facility], you and your health needs are our priority.  As part of our continuing mission to provide you with exceptional heart care, we have created designated Provider Care Teams.  These Care Teams include your primary Cardiologist (physician) and Advanced Practice Providers (APPs -  Physician Assistants and Nurse Practitioners) who all work together to provide you with the care you need, when you need it.   Your next appointment:   KEEP FOLLOW UP DECEMBER 2024  Provider:   Reatha Harps, MD

## 2023-03-18 NOTE — Progress Notes (Signed)
Patient denies any smoking history during today's visit, will hold off on chest CT in 12 month. I did explain she need a liver US during today's visit

## 2023-03-23 ENCOUNTER — Ambulatory Visit: Payer: Medicare PPO | Admitting: Physician Assistant

## 2023-03-23 ENCOUNTER — Inpatient Hospital Stay
Admission: RE | Admit: 2023-03-23 | Discharge: 2023-03-23 | Discharge status: Home / Self Care | Payer: Medicare PPO | Source: Ambulatory Visit | Attending: Physician Assistant

## 2023-03-23 DIAGNOSIS — K7689 Other specified diseases of liver: Secondary | ICD-10-CM

## 2023-03-23 DIAGNOSIS — K769 Liver disease, unspecified: Secondary | ICD-10-CM | POA: Diagnosis not present

## 2023-03-23 DIAGNOSIS — K8020 Calculus of gallbladder without cholecystitis without obstruction: Secondary | ICD-10-CM | POA: Diagnosis not present

## 2023-03-23 NOTE — Progress Notes (Signed)
Unfortunately, the abdominal US also shows a liver lesion, will need abdominal MRI to further assess

## 2023-03-23 NOTE — Telephone Encounter (Signed)
Spoke with [name] does not plan on returning to cardiac rehab at this time as she has not been cleared to return. [name] may decide to return to complete cardiac rehab at a later date and time when able. Will discharge from cardiac rehab at this time.Thayer Headings RN BSN

## 2023-03-24 ENCOUNTER — Encounter (HOSPITAL_COMMUNITY): Payer: Self-pay | Admitting: *Deleted

## 2023-03-24 DIAGNOSIS — Z955 Presence of coronary angioplasty implant and graft: Secondary | ICD-10-CM

## 2023-03-24 NOTE — Progress Notes (Signed)
Discharge Progress Report  Patient Details  Name: WILLIETTE ALSMAN MRN: [id number] Date of Birth: 06-02-1940 Referring Provider:   Flowsheet Row INTENSIVE CARDIAC REHAB ORIENT from 12/07/2022 in [facility]  Referring Provider Lennie Odor, Md        Number of Visits: 42  Reason for Discharge:  Early Exit:  ongoing angina symptoms  Smoking History:  Social History   Tobacco Use  Smoking Status Never  Smokeless Tobacco Never    Diagnosis:  07/21/22 Status post coronary artery stent placement, LAD, Cx, Om2  ADL UCSD:   Initial Exercise Prescription:   Discharge Exercise Prescription (Final Exercise Prescription Changes):  Exercise Prescription Changes - 02/02/23 1624       Response to Exercise   Blood Pressure (Admit) 112/60    Blood Pressure (Exercise) 130/60    Blood Pressure (Exit) 112/64    Heart Rate (Admit) 67 bpm    Heart Rate (Exercise) 83 bpm    Heart Rate (Exit) 72 bpm    Rating of Perceived Exertion (Exercise) 10.5    Perceived Dyspnea (Exercise) 0    Symptoms none    Comments Reviewed MET's and goals    Duration Progress to 30 minutes of  aerobic without signs/symptoms of physical distress    Intensity THRR unchanged      Progression   Progression Continue to progress workloads to maintain intensity without signs/symptoms of physical distress.    Average METs 3.75      Resistance Training   Training Prescription No    Weight 4 lbs wts    Reps 10-15    Time 10 Minutes      Recumbant Bike   Level 3    RPM 63    Watts 36    Minutes 15    METs 3.5      NuStep   Level 3    SPM 94    Minutes 15    METs 3.5      Home Exercise Plan   Plans to continue exercise at Home (comment)    Frequency Add 2 additional days to program exercise sessions.    Initial Home Exercises Provided 01/03/23             Functional Capacity:   Psychological, QOL, Others - Outcomes: PHQ 2/9:     12/07/2022    3:21 PM  Depression screen PHQ 2/9  Decreased Interest 1  Down, Depressed, Hopeless 0  PHQ - 2 Score 1  Altered sleeping 2  Tired, decreased energy 2  Change in appetite 1  Feeling bad or failure about yourself  0  Trouble concentrating 0  Moving slowly or fidgety/restless 0  Suicidal thoughts 0  PHQ-9 Score 6  Difficult doing work/chores Not difficult at all    Quality of Life:   Personal Goals: Goals established at orientation with interventions provided to work toward goal.    Personal Goals Discharge:  Goals and Risk Factor Review     Row Name 01/25/23 0758 02/23/23 0953 03/24/23 0910         Core Components/Risk Factors/Patient Goals Review   Personal Goals Review Weight Management/Obesity;Stress;Hypertension;Lipids Weight Management/Obesity;Stress;Hypertension;Lipids Weight Management/Obesity;Stress;Hypertension;Lipids     Review Caran started cardiac rehab on 12/13/22 and has been doing well with exercise. Vital signs remain stable. Katherina's Imdur was restarted as Shirely had some reoccurring chest pressure. Serenah reports having a mild intermittent headache. Katee's exercise is currently on hold. Deundra's has been  discharged from cardiac rehab at this time as she continued to have anginal symptoms during exercise     Expected Outcomes Kameah will continue to participate in intensive cardiac rehab for exercise, nutrition and lifestyle modifications Sharnita will continue to participate in intensive cardiac rehab for exercise, nutrition and lifestyle modifications Avneet will continue to  exercise, follow  nutrition and lifestyle modifications after completing in cardiac rehab              Exercise Goals and Review:   Exercise Goals Re-Evaluation:  Exercise Goals Re-Evaluation     Row Name 02/02/23 1627             Exercise Goal Re-Evaluation   Exercise Goals Review Increase Physical Activity;Understanding of Exercise Prescription;Increase Strength and  Stamina;Knowledge and understanding of Target Heart Rate Range (THRR);Able to understand and use rate of perceived exertion (RPE) scale       Comments Reviewed MET's and goals. Pt tolerated exercise well with an average MET level of  3.75. Pt feels good about her goals and she is getting back to her normal activites. She has made a lot of dietary changes, is back to kayaking and has improved sleep. Overall she feels good       Expected Outcomes Will continue to monitor pt and progress workloads as tolerated without sign or symptom                Nutrition & Weight - Outcomes:    Nutrition:  Nutrition Therapy & Goals - 03/11/23 1549       Nutrition Therapy   Diet Heart healthy diet    Drug/Food Interactions Statins/Certain Fruits      Personal Nutrition Goals   Nutrition Goal Patient to identify strategies for reducing cardiovascular risk by attending the [employer] education and nutrition series weekly.   goal in action.   Personal Goal #2 Patient to improve diet quality by using the plate method as a guide for meal planning to include lean protein/plant protein, fruits, vegetables, whole grains, nonfat dairy as part of a well-balanced diet.   goal in action.   Personal Goal #3 Patient to reduce sodium intake to 1500mg  per day   goal in action.   Comments Mora has not attended intensive cardiac rehab since 9/9; she continues additional cardiac workup. Prior to her absences, Saniya was attending the [employer] education and nutrition series regularly. Hayla reported following a low sugar diet with close attention to saturated fat and sodium intake. 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 LDL and A1c remain well controlled. WIll continue to assess goals upon patient's return. Patient will benefit from participation in intensive cardiac rehab for nutrition, exercise, and lifestyle modification.      Intervention Plan   Intervention Prescribe, educate and counsel regarding  individualized specific dietary modifications aiming towards targeted core components such as weight, hypertension, lipid management, diabetes, heart failure and other comorbidities.;Nutrition handout(s) given to patient.    Expected Outcomes Short Term Goal: Understand basic principles of dietary content, such as calories, fat, sodium, cholesterol and nutrients.;Long Term Goal: Adherence to prescribed nutrition plan.             Nutrition Discharge:   Education Questionnaire Score:   Neysha attended 42 exercise sessions between 12/07/22-02/07/23. Vessie enjoyed participating in cardiac rehab. Chauntae stopped participating in cardiac rehab due to continuing chest pressure/and burning. Lun has been discharged as she has not been cleared to return to exercise. Hermie says she symptoms have improved  since her medications have been adjusted.Thayer Headings RN BSN

## 2023-03-25 ENCOUNTER — Ambulatory Visit: Payer: Medicare PPO | Admitting: General Practice

## 2023-03-25 ENCOUNTER — Other Ambulatory Visit: Payer: Self-pay | Admitting: Physician Assistant

## 2023-03-25 DIAGNOSIS — K7689 Other specified diseases of liver: Secondary | ICD-10-CM

## 2023-03-25 NOTE — Telephone Encounter (Signed)
Message below relayed to patient:  Unfortunately, the abdominal US also shows a liver lesion, will need abdominal MRI to further assess    Would like for me to go ahead and place the order for the MRI? If so can you specify what MRI you would like ordered.

## 2023-03-30 DIAGNOSIS — M79671 Pain in right foot: Secondary | ICD-10-CM | POA: Diagnosis not present

## 2023-04-03 ENCOUNTER — Ambulatory Visit (HOSPITAL_COMMUNITY)
Admission: RE | Admit: 2023-04-03 | Discharge: 2023-04-03 | Disposition: A | Discharge status: Home / Self Care | Payer: Medicare PPO | Source: Ambulatory Visit | Attending: Physician Assistant | Admitting: Physician Assistant

## 2023-04-03 DIAGNOSIS — I700 Atherosclerosis of aorta: Secondary | ICD-10-CM | POA: Diagnosis not present

## 2023-04-03 DIAGNOSIS — K769 Liver disease, unspecified: Secondary | ICD-10-CM | POA: Diagnosis not present

## 2023-04-03 DIAGNOSIS — K7689 Other specified diseases of liver: Secondary | ICD-10-CM | POA: Diagnosis not present

## 2023-04-03 DIAGNOSIS — K8020 Calculus of gallbladder without cholecystitis without obstruction: Secondary | ICD-10-CM | POA: Diagnosis not present

## 2023-04-03 DIAGNOSIS — C220 Liver cell carcinoma: Secondary | ICD-10-CM | POA: Diagnosis not present

## 2023-04-03 MED ORDER — GADOBUTROL 1 MMOL/ML IV SOLN
8.0000 mL | Freq: Once | INTRAVENOUS | Status: AC | PRN
  Administered 2023-04-03: 8 mL via INTRAVENOUS

## 2023-04-13 ENCOUNTER — Telehealth: Payer: Self-pay | Admitting: Cardiovascular Disease

## 2023-04-13 NOTE — Telephone Encounter (Signed)
Spoke to patient who was calling for result from her MRI and PET scan. After talking to patient she remembered she had received results. She did report that since the increase in her Imdur she has been off balance and has fallen. She deny dizziness, lightheadedness or any other symptoms at this time. Patient stated she injured her right knee and right 4th toe but nothing was broken.

## 2023-04-13 NOTE — Telephone Encounter (Signed)
Pt is calling wanting results for PET Scan and MRI

## 2023-04-13 NOTE — Telephone Encounter (Signed)
Spoke to patient and below message relayed. Patient scheduled for 11/15 at 3 pm with Dr Flora Lipps. Patient verbalized understanding and agree.

## 2023-04-14 NOTE — Progress Notes (Signed)
Cardiology Office Note:  .   Date:  04/15/2023  ID:  Tiffany Mayo, DOB 19-Apr-1941, MRN [id number] PCP: Gaspar Garbe, MD  [facility] Providers Cardiologist:  Reatha Harps, MD  History of Present Illness: .   Tiffany Mayo is a 82 y.o. female with history of CAD who presents for follow-up.   History of Present Illness   Tiffany Mayo, an 82 year old female with a history of CAD status post PCI of the mid LAD, circumflex, and OM branch, presents for follow-up. She has residual diagonal disease which was seen on a cardiac stress test. Her cholesterol levels are at goal. She reports persistent chest discomfort, particularly with activity. The discomfort was slightly better when she was on 60mg  of Imdur, but she had to reduce the dose to 30mg  due to dizziness. She also reports balance issues, which she attributes to her medications. She is also on Keppra for a suspected TIA. She has not had any seizures since starting the medication. She is also on aspirin, Plavix, and Lipitor for her CAD.         Problem List CAD -60% pLAD -mid LAD 80% -> PCI 07/21/2022 -LCX/OM1 90% -> PCI 07/21/2022 -95% D1 -PET MPI 07/2022 -> medium size, moderate defect mid to apical anterior/apical lateral (diagonal ischemia) 2. HLD  -T chol 120, HDL 43, LDL 65, TG 102 3. TIA 4. 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 Pertinent positives noted in the HPI.     Studies Reviewed: Marland Kitchen   EKG Interpretation Date/Time:  Friday April 15 2023 15:10:39 EST Ventricular Rate:  52 PR Interval:  196 QRS Duration:  78 QT Interval:  438 QTC Calculation: 407 R Axis:   0  Text Interpretation: Sinus bradycardia Confirmed by Lennie Odor [phone]) on 04/15/2023 3:26:02 PM    NM PET MPI 03/09/2023   Reversible perfusion defect consistent with ischemia in apical to mid anterior and apical lateral wall.  Myocardial blood flow reserve  unreliable in setting of prior PCI.   TID is present (1.14). Findings consistent with LAD territory ischemia; does have known severe diagonal stenosis, which could be the cause of perfusion defect, but in setting of high risk finding (TID), would consider cardiac catheterization   LV perfusion is abnormal. There is evidence of ischemia. Defect 1: There is a medium defect with moderate reduction in uptake present in the apical to mid anterior and anterolateral location(s) that is reversible. There is normal wall motion in the defect area. Consistent with ischemia.   Rest left ventricular function is normal. Rest EF: 56%. Stress left ventricular function is normal. Stress EF: 63%. End diastolic cavity size is normal. End systolic cavity size is normal.   Myocardial blood flow reserve is not reported in this patient due to technical or patient-specific concerns that affect accuracy (prior PCI)   Coronary calcium assessment not performed due to prior revascularization.   Findings are consistent with ischemia. The study is high risk. Physical Exam:   VS:  BP 114/70 (BP Location: Left Arm, Patient Position: Sitting, Cuff Size: Normal)   Pulse (!) 52   Ht 5' 1.5" (1.562 m)   Wt 139 lb 9.6 oz (63.3 kg)   SpO2 95%   BMI 25.95 kg/m    Wt Readings from Last 3 Encounters:  04/15/23 139 lb 9.6 oz (63.3 kg)  03/18/23 138 lb (62.6 kg)  02/23/23 138 lb (62.6 kg)  GEN: Well nourished, well developed in no acute distress NECK: No JVD; No carotid bruits CARDIAC: RRR, no murmurs, rubs, gallops RESPIRATORY:  Clear to auscultation without rales, wheezing or rhonchi  ABDOMEN: Soft, non-tender, non-distended EXTREMITIES:  No edema; No deformity  ASSESSMENT AND PLAN: .   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 Residual diagonal disease noted on stress test. Stents in LAD, circumflex, and OM branch  are likely patent. Discussed the possibility of re-evaluating with left heart catheterization due to failure of medical therapy. EKG is normal.  -Reduce Imdur to 30mg  daily due to dizziness. -Add Ranexa 500mg  twice daily. -Continue Aspirin, Plavix, and Lipitor. -Perform left heart catheterization on Thursday of next week to evaluate stents and possible intervention on diagonal branch. -risks and benefits explained, she will proceed.   Hyperlipidemia LDL at goal on Lipitor. -Continue Lipitor.  Follow-up -Return visit on December 10th. -Complete CBC and BMET today for pre-catheterization evaluation.          Informed Consent   Shared Decision Making/Informed Consent The risks [stroke (1 in 1000), death (1 in 1000), kidney failure [usually temporary] (1 in 500), bleeding (1 in 200), allergic reaction [possibly serious] (1 in 200)], benefits (diagnostic support and management of coronary artery disease) and alternatives of a cardiac catheterization were discussed in detail with Ms. Chism and she is willing to proceed.      Follow-up: Return in about 3 weeks (around 05/06/2023).  Time Spent with Patient: I have spent a total of 35 minutes caring for this patient today face to face, ordering and reviewing labs/tests, reviewing prior records/medical history, examining the patient, establishing an assessment and plan, communicating results/findings to the patient/family, and documenting in the medical record.   Signed, Lenna Gilford. Flora Lipps, MD, [facility]  [facility]  [address] [place], [place] [address] [phone]  6:15 PM

## 2023-04-14 NOTE — H&P (View-Only) (Signed)
Cardiology Office Note:  .   Date:  04/15/2023  ID:  Tiffany Mayo, DOB 19-Apr-1941, MRN [id number] PCP: Gaspar Garbe, MD  [facility] Providers Cardiologist:  Reatha Harps, MD  History of Present Illness: .   Tiffany Mayo is a 82 y.o. female with history of CAD who presents for follow-up.   History of Present Illness   Miss Tiffany Mayo, an 82 year old female with a history of CAD status post PCI of the mid LAD, circumflex, and OM branch, presents for follow-up. She has residual diagonal disease which was seen on a cardiac stress test. Her cholesterol levels are at goal. She reports persistent chest discomfort, particularly with activity. The discomfort was slightly better when she was on 60mg  of Imdur, but she had to reduce the dose to 30mg  due to dizziness. She also reports balance issues, which she attributes to her medications. She is also on Keppra for a suspected TIA. She has not had any seizures since starting the medication. She is also on aspirin, Plavix, and Lipitor for her CAD.         Problem List CAD -60% pLAD -mid LAD 80% -> PCI 07/21/2022 -LCX/OM1 90% -> PCI 07/21/2022 -95% D1 -PET MPI 07/2022 -> medium size, moderate defect mid to apical anterior/apical lateral (diagonal ischemia) 2. HLD  -T chol 120, HDL 43, LDL 65, TG 102 3. TIA 4. 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 Pertinent positives noted in the HPI.     Studies Reviewed: Marland Kitchen   EKG Interpretation Date/Time:  Friday April 15 2023 15:10:39 EST Ventricular Rate:  52 PR Interval:  196 QRS Duration:  78 QT Interval:  438 QTC Calculation: 407 R Axis:   0  Text Interpretation: Sinus bradycardia Confirmed by Lennie Odor [phone]) on 04/15/2023 3:26:02 PM    NM PET MPI 03/09/2023   Reversible perfusion defect consistent with ischemia in apical to mid anterior and apical lateral wall.  Myocardial blood flow reserve  unreliable in setting of prior PCI.   TID is present (1.14). Findings consistent with LAD territory ischemia; does have known severe diagonal stenosis, which could be the cause of perfusion defect, but in setting of high risk finding (TID), would consider cardiac catheterization   LV perfusion is abnormal. There is evidence of ischemia. Defect 1: There is a medium defect with moderate reduction in uptake present in the apical to mid anterior and anterolateral location(s) that is reversible. There is normal wall motion in the defect area. Consistent with ischemia.   Rest left ventricular function is normal. Rest EF: 56%. Stress left ventricular function is normal. Stress EF: 63%. End diastolic cavity size is normal. End systolic cavity size is normal.   Myocardial blood flow reserve is not reported in this patient due to technical or patient-specific concerns that affect accuracy (prior PCI)   Coronary calcium assessment not performed due to prior revascularization.   Findings are consistent with ischemia. The study is high risk. Physical Exam:   VS:  BP 114/70 (BP Location: Left Arm, Patient Position: Sitting, Cuff Size: Normal)   Pulse (!) 52   Ht 5' 1.5" (1.562 m)   Wt 139 lb 9.6 oz (63.3 kg)   SpO2 95%   BMI 25.95 kg/m    Wt Readings from Last 3 Encounters:  04/15/23 139 lb 9.6 oz (63.3 kg)  03/18/23 138 lb (62.6 kg)  02/23/23 138 lb (62.6 kg)  GEN: Well nourished, well developed in no acute distress NECK: No JVD; No carotid bruits CARDIAC: RRR, no murmurs, rubs, gallops RESPIRATORY:  Clear to auscultation without rales, wheezing or rhonchi  ABDOMEN: Soft, non-tender, non-distended EXTREMITIES:  No edema; No deformity  ASSESSMENT AND PLAN: .   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 Residual diagonal disease noted on stress test. Stents in LAD, circumflex, and OM branch  are likely patent. Discussed the possibility of re-evaluating with left heart catheterization due to failure of medical therapy. EKG is normal.  -Reduce Imdur to 30mg  daily due to dizziness. -Add Ranexa 500mg  twice daily. -Continue Aspirin, Plavix, and Lipitor. -Perform left heart catheterization on Thursday of next week to evaluate stents and possible intervention on diagonal branch. -risks and benefits explained, she will proceed.   Hyperlipidemia LDL at goal on Lipitor. -Continue Lipitor.  Follow-up -Return visit on December 10th. -Complete CBC and BMET today for pre-catheterization evaluation.          Informed Consent   Shared Decision Making/Informed Consent The risks [stroke (1 in 1000), death (1 in 1000), kidney failure [usually temporary] (1 in 500), bleeding (1 in 200), allergic reaction [possibly serious] (1 in 200)], benefits (diagnostic support and management of coronary artery disease) and alternatives of a cardiac catheterization were discussed in detail with Ms. Tiffany Mayo and she is willing to proceed.      Follow-up: Return in about 3 weeks (around 05/06/2023).  Time Spent with Patient: I have spent a total of 35 minutes caring for this patient today face to face, ordering and reviewing labs/tests, reviewing prior records/medical history, examining the patient, establishing an assessment and plan, communicating results/findings to the patient/family, and documenting in the medical record.   Signed, Lenna Gilford. Flora Lipps, MD, [facility]  [facility]  [address] [place], [place] [address] [phone]  6:15 PM

## 2023-04-15 ENCOUNTER — Ambulatory Visit: Payer: Medicare PPO | Attending: Cardiovascular Disease | Admitting: Cardiovascular Disease

## 2023-04-15 ENCOUNTER — Encounter: Payer: Self-pay | Admitting: Cardiovascular Disease

## 2023-04-15 VITALS — BP 114/70 | HR 52 | Ht 61.5 in | Wt 139.6 lb

## 2023-04-15 DIAGNOSIS — E782 Mixed hyperlipidemia: Secondary | ICD-10-CM

## 2023-04-15 DIAGNOSIS — Z01812 Encounter for preprocedural laboratory examination: Secondary | ICD-10-CM | POA: Diagnosis not present

## 2023-04-15 DIAGNOSIS — I25118 Atherosclerotic heart disease of native coronary artery with other forms of angina pectoris: Secondary | ICD-10-CM

## 2023-04-15 MED ORDER — RANOLAZINE ER 500 MG PO TB12: 500.0000 mg | ORAL_TABLET | Freq: Two times a day (BID) | ORAL | 3 refills | Status: DC

## 2023-04-15 MED ORDER — ISOSORBIDE MONONITRATE ER 30 MG PO TB24: 30.0000 mg | ORAL_TABLET | Freq: Every day | ORAL | 3 refills | Status: DC

## 2023-04-15 NOTE — Patient Instructions (Signed)
Medication Instructions:  - Start IMDUR 30MG  DAILY  - START Renexa 500mg  twice daily    *If you need a refill on your cardiac medications before your next appointment, please call your pharmacy*   Lab Work: CBC BMET    If you have labs (blood work) drawn today and your tests are completely normal, you will receive your results only by: MyChart Message (if you have MyChart) OR A paper copy in the mail If you have any lab test that is abnormal or we need to change your treatment, we will call you to review the results.   Testing/Procedures:  [place] [facility] DEPT OF [place] AT [facility] [address] [place] [address] Dept: [phone] Loc: [phone]  Tiffany Mayo  04/15/2023  You are scheduled for a Cardiac Catheterization on Thursday, November 21 with Dr. Alverda Skeans.  1. Please arrive at the [facility] (Main Entrance A) at [facility]: [address] [place], [place] [address] at 5:30 AM (This time is 2 hour(s) before your procedure to ensure your preparation).   Free valet parking service is available. You will check in at ADMITTING. The support person will be asked to wait in the waiting room.  It is OK to have someone drop you off and come back when you are ready to be discharged.    Special note: Every effort is made to have your procedure done on time. Please understand that emergencies sometimes delay scheduled procedures.  2. Diet: Do not eat solid foods after midnight.  The patient may have clear liquids until 5am upon the day of the procedure.  3. Labs: You will need to have blood drawn on Friday, November 15 at [facility] [address], [place]  Open: 8am - 5pm (Lunch 12:30 - 1:30)   Phone: [phone]. You do not need to be fasting.  4. Medication instructions in preparation for your procedure:   Contrast Allergy: No  On the morning of your  procedure, take your Aspirin 81 mg and any morning medicines NOT listed above.  You may use sips of water.  5. Plan to go home the same day, you will only stay overnight if medically necessary. 6. Bring a current list of your medications and current insurance cards. 7. You MUST have a responsible person to drive you home. 8. Someone MUST be with you the first 24 hours after you arrive home or your discharge will be delayed. 9. Please wear clothes that are easy to get on and off and wear slip-on shoes.  Thank you for allowing [place] to care for you!   -- [facility]    Follow-Up: At [facility], you and your health needs are our priority.  As part of our continuing mission to provide you with exceptional heart care, we have created designated Provider Care Teams.  These Care Teams include your primary Cardiologist (physician) and Advanced Practice Providers (APPs -  Physician Assistants and Nurse Practitioners) who all work together to provide you with the care you need, when you need it.  We recommend signing up for the patient portal called "MyChart".  Sign up information is provided on this After Visit Summary.  MyChart is used to connect with patients for Virtual Visits (Telemedicine).  Patients are able to view lab/test results, encounter notes, upcoming appointments, etc.  Non-urgent messages can be sent to your provider as well.   To  learn more about what you can do with MyChart, go to [email or web address].    Your next appointment:   1 month(s): May 10, 2023 AT 9:00AM   The format for your next appointment:   In Person  Provider:   Reatha Harps, MD    Other Instructions

## 2023-04-18 DIAGNOSIS — Z01810 Encounter for preprocedural cardiovascular examination: Secondary | ICD-10-CM | POA: Diagnosis not present

## 2023-04-18 DIAGNOSIS — Z01812 Encounter for preprocedural laboratory examination: Secondary | ICD-10-CM | POA: Diagnosis not present

## 2023-04-19 ENCOUNTER — Telehealth: Payer: Self-pay | Admitting: *Deleted

## 2023-04-19 LAB — BASIC METABOLIC PANEL
BUN/Creatinine Ratio: 16 (ref 12–28)
BUN: 19 mg/dL (ref 8–27)
CO2: 23 mmol/L (ref 20–29)
Calcium: 9.2 mg/dL (ref 8.7–10.3)
Chloride: 103 mmol/L (ref 96–106)
Creatinine, Ser: 1.22 mg/dL — ABNORMAL HIGH (ref 0.57–1.00)
Glucose: 88 mg/dL (ref 70–99)
Potassium: 4.8 mmol/L (ref 3.5–5.2)
Sodium: 140 mmol/L (ref 134–144)
eGFR: 44 mL/min/{1.73_m2} — ABNORMAL LOW (ref >59)

## 2023-04-19 LAB — CBC
Hematocrit: 38.1 % (ref 34.0–46.6)
Hemoglobin: 12.6 g/dL (ref 11.1–15.9)
MCH: 32.6 pg (ref 26.6–33.0)
MCHC: 33.1 g/dL (ref 31.5–35.7)
MCV: 98 fL — ABNORMAL HIGH (ref 79–97)
Platelets: 169 10*3/uL (ref 150–450)
RBC: 3.87 x10E6/uL (ref 3.77–5.28)
RDW: 11.6 % — ABNORMAL LOW (ref 11.7–15.4)
WBC: 3.3 10*3/uL — ABNORMAL LOW (ref 3.4–10.8)

## 2023-04-19 NOTE — Telephone Encounter (Addendum)
Cardiac Catheterization scheduled at [facility] for: Thursday April 21, 2023 10:30 AM Arrival time [facility] Main Entrance A at: 5:30 AM-pre-procedure hydration  Nothing to eat after midnight prior to procedure, clear liquids until 5 AM day of procedure.  Medication instructions: -Usual morning medications can be taken with sips of water including aspirin 81 mg and Plavix 75 mg.   Plan to go home the same day, you will only stay overnight if medically necessary.  You must have responsible adult to drive you home.  Someone must be with you the first 24 hours after you arrive home.  Reviewed procedure instructions, pre-procedure hydration with patient.

## 2023-04-21 ENCOUNTER — Encounter (HOSPITAL_COMMUNITY)
Admission: RE | Disposition: A | Discharge status: Home / Self Care | Payer: Self-pay | Source: Ambulatory Visit | Attending: Cardiology

## 2023-04-21 ENCOUNTER — Other Ambulatory Visit (HOSPITAL_COMMUNITY): Payer: Self-pay

## 2023-04-21 ENCOUNTER — Other Ambulatory Visit: Payer: Self-pay

## 2023-04-21 ENCOUNTER — Ambulatory Visit (HOSPITAL_COMMUNITY)
Admission: RE | Admit: 2023-04-21 | Discharge: 2023-04-21 | Disposition: A | Discharge status: Home / Self Care | Payer: Medicare PPO | Source: Ambulatory Visit | Attending: Cardiology | Admitting: Cardiology

## 2023-04-21 DIAGNOSIS — Z79899 Other long term (current) drug therapy: Secondary | ICD-10-CM | POA: Diagnosis not present

## 2023-04-21 DIAGNOSIS — I25118 Atherosclerotic heart disease of native coronary artery with other forms of angina pectoris: Secondary | ICD-10-CM | POA: Insufficient documentation

## 2023-04-21 DIAGNOSIS — E785 Hyperlipidemia, unspecified: Secondary | ICD-10-CM | POA: Diagnosis not present

## 2023-04-21 DIAGNOSIS — G40909 Epilepsy, unspecified, not intractable, without status epilepticus: Secondary | ICD-10-CM | POA: Diagnosis not present

## 2023-04-21 DIAGNOSIS — I2510 Atherosclerotic heart disease of native coronary artery without angina pectoris: Secondary | ICD-10-CM

## 2023-04-21 DIAGNOSIS — Z7982 Long term (current) use of aspirin: Secondary | ICD-10-CM | POA: Insufficient documentation

## 2023-04-21 DIAGNOSIS — Z8673 Personal history of transient ischemic attack (TIA), and cerebral infarction without residual deficits: Secondary | ICD-10-CM | POA: Insufficient documentation

## 2023-04-21 DIAGNOSIS — Z955 Presence of coronary angioplasty implant and graft: Secondary | ICD-10-CM | POA: Insufficient documentation

## 2023-04-21 DIAGNOSIS — Z7902 Long term (current) use of antithrombotics/antiplatelets: Secondary | ICD-10-CM | POA: Diagnosis not present

## 2023-04-21 HISTORY — PX: CORONARY PRESSURE/FFR STUDY: CATH118243

## 2023-04-21 HISTORY — PX: LEFT HEART CATH AND CORONARY ANGIOGRAPHY: CATH118249

## 2023-04-21 LAB — POCT ACTIVATED CLOTTING TIME: Activated Clotting Time: 250 s

## 2023-04-21 SURGERY — LEFT HEART CATH AND CORONARY ANGIOGRAPHY
Anesthesia: LOCAL

## 2023-04-21 MED ORDER — MIDAZOLAM HCL 2 MG/2ML IJ SOLN
INTRAMUSCULAR | Status: DC | PRN
  Administered 2023-04-21 (×2): 2 mg via INTRAVENOUS

## 2023-04-21 MED ORDER — FENTANYL CITRATE (PF) 100 MCG/2ML IJ SOLN
INTRAMUSCULAR | Status: DC | PRN
  Administered 2023-04-21 (×2): 25 ug via INTRAVENOUS

## 2023-04-21 MED ORDER — ASPIRIN 81 MG PO CHEW: 81.0000 mg | CHEWABLE_TABLET | ORAL | Status: DC

## 2023-04-21 MED ORDER — FENTANYL CITRATE (PF) 100 MCG/2ML IJ SOLN
INTRAMUSCULAR | Status: AC
  Filled 2023-04-21: qty 2

## 2023-04-21 MED ORDER — HEPARIN SODIUM (PORCINE) 1000 UNIT/ML IJ SOLN
INTRAMUSCULAR | Status: AC
  Filled 2023-04-21: qty 10

## 2023-04-21 MED ORDER — MIDAZOLAM HCL 2 MG/2ML IJ SOLN
INTRAMUSCULAR | Status: AC
Start: 2023-04-21 — End: ?
  Filled 2023-04-21: qty 2

## 2023-04-21 MED ORDER — LIDOCAINE HCL (PF) 1 % IJ SOLN
INTRAMUSCULAR | Status: DC | PRN
  Administered 2023-04-21: 2 mL

## 2023-04-21 MED ORDER — HYDRALAZINE HCL 20 MG/ML IJ SOLN
INTRAMUSCULAR | Status: AC
  Filled 2023-04-21: qty 1

## 2023-04-21 MED ORDER — ONDANSETRON HCL 4 MG/2ML IJ SOLN: 4.0000 mg | Freq: Four times a day (QID) | INTRAMUSCULAR | Status: DC | PRN

## 2023-04-21 MED ORDER — IOHEXOL 350 MG/ML SOLN
INTRAVENOUS | Status: DC | PRN
  Administered 2023-04-21: 50 mL

## 2023-04-21 MED ORDER — HYDRALAZINE HCL 20 MG/ML IJ SOLN
INTRAMUSCULAR | Status: DC | PRN
  Administered 2023-04-21: 10 mg via INTRAVENOUS

## 2023-04-21 MED ORDER — SODIUM CHLORIDE 0.9 % WEIGHT BASED INFUSION
1.0000 mL/kg/h | INTRAVENOUS | Status: DC
  Administered 2023-04-21: 1 mL/kg/h via INTRAVENOUS

## 2023-04-21 MED ORDER — SODIUM CHLORIDE 0.9% FLUSH: 3.0000 mL | Freq: Two times a day (BID) | INTRAVENOUS | Status: DC

## 2023-04-21 MED ORDER — HEPARIN (PORCINE) IN NACL 1000-0.9 UT/500ML-% IV SOLN
INTRAVENOUS | Status: DC | PRN
  Administered 2023-04-21 (×2): 500 mL

## 2023-04-21 MED ORDER — ADENOSINE (DIAGNOSTIC) 140MCG/KG/MIN
INTRAVENOUS | Status: DC | PRN
  Administered 2023-04-21: 140 ug/kg/min via INTRAVENOUS

## 2023-04-21 MED ORDER — LIDOCAINE HCL (PF) 1 % IJ SOLN
INTRAMUSCULAR | Status: AC
  Filled 2023-04-21: qty 30

## 2023-04-21 MED ORDER — VERAPAMIL HCL 2.5 MG/ML IV SOLN
INTRAVENOUS | Status: DC | PRN
  Administered 2023-04-21: 10 mL via INTRA_ARTERIAL

## 2023-04-21 MED ORDER — ADENOSINE 12 MG/4ML IV SOLN
INTRAVENOUS | Status: AC
  Filled 2023-04-21: qty 16

## 2023-04-21 MED ORDER — CARVEDILOL 6.25 MG PO TABS
6.2500 mg | ORAL_TABLET | Freq: Two times a day (BID) | ORAL | 1 refills | Status: DC
  Filled 2023-04-21: qty 60, 30d supply, fill #0

## 2023-04-21 MED ORDER — VERAPAMIL HCL 2.5 MG/ML IV SOLN
INTRAVENOUS | Status: AC
  Filled 2023-04-21: qty 2

## 2023-04-21 MED ORDER — HEPARIN SODIUM (PORCINE) 1000 UNIT/ML IJ SOLN
INTRAMUSCULAR | Status: DC | PRN
  Administered 2023-04-21: 3000 [IU] via INTRAVENOUS
  Administered 2023-04-21: 4000 [IU] via INTRAVENOUS

## 2023-04-21 MED ORDER — ACETAMINOPHEN 325 MG PO TABS: 650.0000 mg | ORAL_TABLET | ORAL | Status: DC | PRN

## 2023-04-21 MED ORDER — SODIUM CHLORIDE 0.9 % WEIGHT BASED INFUSION
3.0000 mL/kg/h | INTRAVENOUS | Status: AC
  Administered 2023-04-21: 3 mL/kg/h via INTRAVENOUS

## 2023-04-21 SURGICAL SUPPLY — 13 items
CATH INFINITI AMBI 5FR TG (CATHETERS) IMPLANT
CATH INFINITI JR4 5F (CATHETERS) IMPLANT
CATH VISTA GUIDE 6FR XB3.5 (CATHETERS) IMPLANT
DEVICE RAD TR BAND REGULAR (VASCULAR PRODUCTS) IMPLANT
GLIDESHEATH SLEND A-KIT 6F 22G (SHEATH) IMPLANT
GUIDEWIRE ANGLED .035X150CM (WIRE) IMPLANT
GUIDEWIRE INQWIRE 1.5J.035X260 (WIRE) IMPLANT
GUIDEWIRE PRESSURE X 175 (WIRE) IMPLANT
INQWIRE 1.5J .035X260CM (WIRE) ×1
KIT ESSENTIALS PG (KITS) IMPLANT
PACK CARDIAC CATHETERIZATION (CUSTOM PROCEDURE TRAY) ×1 IMPLANT
SET ATX-X65L (MISCELLANEOUS) IMPLANT
TUBING CIL FLEX 10 FLL-RA (TUBING) IMPLANT

## 2023-04-21 NOTE — Progress Notes (Signed)
Spoke with Clydie Braun in Cath Lab about patient's son needing to still talk to Dr. Jacinto Halim.

## 2023-04-21 NOTE — Interval H&P Note (Signed)
History and Physical Interval Note:  04/21/2023 10:24 AM  Tiffany Mayo  has presented today for surgery, with the diagnosis of cad.  The various methods of treatment have been discussed with the patient and family. After consideration of risks, benefits and other options for treatment, the patient has consented to  Procedure(s): LEFT HEART CATH AND CORONARY ANGIOGRAPHY (N/A) and possible coronary angioplasty as a surgical intervention.  The patient's history has been reviewed, patient examined, no change in status, stable for surgery.  I have reviewed the patient's chart and labs.  Questions were answered to the patient's satisfaction.    Patient with known coronary artery disease with angioplasty and stenting with synergy XD 2.75 x 16 mm stent in the proximal LAD and a 2.5 x 28 mm stent in the mid to distal circumflex on 07/21/2022 presenting with recurrent chest pain, stress cardiac PET revealing anterior wall ischemia.  Scheduled for repeat cardiac catheterization.  I personally met the patient, again discussed technique of cardiac catheterization, risks and benefits, patient having class III-IV angina pectoris, in fact had chest discomfort walking into the hospital, presently chest pain-free.  Cath Lab Visit (complete for each Cath Lab visit)  Clinical Evaluation Leading to the Procedure:   ACS: Yes.    Non-ACS:    Anginal Classification: CCS IV  Anti-ischemic medical therapy: Maximal Therapy (2 or more classes of medications)  Non-Invasive Test Results: No non-invasive testing performed  Prior CABG: No previous CABG        Yates Decamp

## 2023-04-21 NOTE — Progress Notes (Addendum)
Client states 4/10 chest discomfort after walking from parking deck; states discomfort has decreased to 2/10; Dr Jacinto Halim notified and no orders noted; client advised to call me if pain increases and client voiced understanding

## 2023-04-22 ENCOUNTER — Encounter (HOSPITAL_COMMUNITY): Payer: Self-pay | Admitting: Cardiology

## 2023-04-25 ENCOUNTER — Telehealth: Payer: Self-pay | Admitting: Cardiovascular Disease

## 2023-04-25 NOTE — Telephone Encounter (Signed)
Patient identification verified by 2 forms. [name], RN    Called and spoke to patient  Patient states:   -Complete heart cath 11/21  -right arm is very sore, achy and throbbing   -has had procedure before, first time she is having this reaction   -was advised not to utilize arm, has attempted to use for gentle activity   -was discharge same day as procedure, once home and medication wore off noticed soreness   -symptoms have improved slightly   -has a small knot at wrist, quarter of inch in size, noticed it yesterday, has remained same size   -took tylenol, provided a little relief  Patient denies:   -redness  -swelling   -new numbness/tingling in arm  Informed patient:   -soreness/pain in arm post procedure is expected  -message sent to Dr. Flora Lipps for input Patient verbalized understanding, no questions at this time

## 2023-04-25 NOTE — Telephone Encounter (Signed)
Patient identification verified by 2 forms. [name], RN    Called and spoke to patient  Relayed provider message  Patient verbalized understanding, no questions at this time

## 2023-04-25 NOTE — Telephone Encounter (Signed)
Patient called and said that she had a heart catherization done on 11/21. Patient said that arm is really sore and a small knot

## 2023-04-25 NOTE — Telephone Encounter (Signed)
Heat and ice as needed for swelling. Tylenol for pain. If no improvement over next 48 hours, call us back.  Gerri Spore T. Flora Lipps, MD, [facility]  [facility] [address] [place], [place] [address] [phone] 12:53 PM

## 2023-04-27 ENCOUNTER — Ambulatory Visit (HOSPITAL_COMMUNITY)
Admission: RE | Admit: 2023-04-27 | Discharge: 2023-04-27 | Disposition: A | Discharge status: Home / Self Care | Payer: Medicare PPO | Source: Ambulatory Visit | Attending: Cardiology | Admitting: Cardiology

## 2023-04-27 ENCOUNTER — Telehealth: Payer: Self-pay | Admitting: Cardiovascular Disease

## 2023-04-27 DIAGNOSIS — M79601 Pain in right arm: Secondary | ICD-10-CM

## 2023-04-27 DIAGNOSIS — M79603 Pain in arm, unspecified: Secondary | ICD-10-CM | POA: Diagnosis not present

## 2023-04-27 NOTE — Telephone Encounter (Signed)
Pt states she was told to c/b if issues continued with arm after having Cath done last week.( See 11/25 note) Pt says that her arm is warm to the touch. There's still a knot, just not as big, elbow down to her arm is sore to touch as well. Pt would like a c/b regarding these issues being that the holiday is coming up. Please advise

## 2023-04-27 NOTE — Telephone Encounter (Addendum)
Patient identification verified by 2 forms. Shade Flood, RN     Called and spoke to patient  Patient states: she is still experiencing pain and warmth in cath arm. Pain is relieved some with rest but only briefly. Pain was waking her up out her sleep until she started taking tylenol. Has taken 500mg  tylenol BID since 11/25.   Patient denies: swelling, redness, numb/cool extremities or drainage at insertion site. No CP, SOB, blurred vision or headache.              Interventions/Plan: Per DOD (Dr. Antoine Poche) patient should get an ultrasound of arm. Ordered placed for [place] VAS, patient called. She needs to see if her son can bring her for appt today. Will check back for update.    UPDATE eff 3:53pm patient completed US VAS. Forwarding encounter to primary card for f/u.   Reviewed ED warning signs/precautions  Patient agrees with plan, no questions at this time

## 2023-05-02 NOTE — Telephone Encounter (Signed)
Called Mrs. Strole. Arm is still swollen but going down. CP improved on coreg. Seems to be doing better. Will keep follow-up on 12/10 with me.   Gerri Spore T. Flora Lipps, MD, [facility]  [facility]  [address] [place], [place] [address] [phone]  12:34 PM

## 2023-05-09 NOTE — Progress Notes (Unsigned)
  Cardiology Office Note:  .   Date:  05/09/2023  ID:  Tiffany Mayo, DOB [birth year], MRN [id number] PCP: Gaspar Garbe, MD  [facility] Providers Cardiologist:  Reatha Harps, MD { Click to update primary MD,subspecialty MD or APP then REFRESH:1}   History of Present Illness: .   Tiffany Mayo is a 82 y.o. female with history of CAD who presents for follow-up.      Problem List CAD -60% pLAD -mid LAD 80% -> PCI 07/21/2022 -LCX/OM1 90% -> PCI 07/21/2022 -95% D1 -PET MPI 07/2022 -> medium size, moderate defect mid to apical anterior/apical lateral (diagonal ischemia) 2. HLD  -T chol 120, HDL 43, LDL 65, TG 102 3. TIA 4. 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 Pertinent positives noted in the HPI.     Studies Reviewed: Marland Kitchen        LHC 04/21/2023 Left Heart Catheterization 04/21/23: Hemodynamic data: LV 190/10, EDP 27 mmHg.  Ao 187/71, mean 116 mmHg.  No pressure gradient across the aortic valve.   Angiographic data: RCA: Large-caliber vessel with mild disease in the midsegment at 30 to 40%.  RV branch is a focal 80% stenosis. LM: Large-caliber vessel, mildly calcified. LAD: Large vessel, mildly tortuous in the midsegment, proximal segment has a 60% stenosis.  Gives origin to a large D1 however it is 1 mm caliber vessel with a high-grade 99% stenosis in the midsegment, the D1 branch extends all the way to the anteroapical region and most consistent with abnormal nuclear stress test. Mid LAD 2.75 x 16 mm Synergy XD DES placed 07/21/2022 is widely patent. LCx: Large-caliber vessel, Previously placed 2.5 x 28 mm Synergy XD DES in the mid segment and distal segment is widely. Physical Exam:   VS:  There were no vitals taken for this visit.   Wt Readings from Last 3 Encounters:  04/21/23 138 lb (62.6 kg)  04/15/23 139 lb 9.6 oz (63.3 kg)  03/18/23 138 lb (62.6 kg)    GEN: Well nourished, well  developed in no acute distress NECK: No JVD; No carotid bruits CARDIAC: ***RRR, no murmurs, rubs, gallops RESPIRATORY:  Clear to auscultation without rales, wheezing or rhonchi  ABDOMEN: Soft, non-tender, non-distended EXTREMITIES:  No edema; No deformity  ASSESSMENT AND PLAN: .   ***    {Are you ordering a CV Procedure (e.g. stress test, cath, DCCV, TEE, etc)?   Press F2        :[id number]}   Follow-up: No follow-ups on file.  Time Spent with Patient: I have spent a total of *** minutes caring for this patient today face to face, ordering and reviewing labs/tests, reviewing prior records/medical history, examining the patient, establishing an assessment and plan, communicating results/findings to the patient/family, and documenting in the medical record.   Signed, Lenna Gilford. Flora Lipps, MD, [facility]  [facility]  [address] [place], [place] [address] [phone]  9:40 AM

## 2023-05-10 ENCOUNTER — Encounter: Payer: Self-pay | Admitting: Cardiovascular Disease

## 2023-05-10 ENCOUNTER — Ambulatory Visit: Payer: Medicare PPO | Attending: Cardiovascular Disease | Admitting: Cardiovascular Disease

## 2023-05-10 VITALS — BP 120/66 | HR 68 | Ht 61.0 in | Wt 139.6 lb

## 2023-05-10 DIAGNOSIS — E782 Mixed hyperlipidemia: Secondary | ICD-10-CM

## 2023-05-10 DIAGNOSIS — I25118 Atherosclerotic heart disease of native coronary artery with other forms of angina pectoris: Secondary | ICD-10-CM

## 2023-05-10 DIAGNOSIS — I10 Essential (primary) hypertension: Secondary | ICD-10-CM

## 2023-05-10 DIAGNOSIS — R251 Tremor, unspecified: Secondary | ICD-10-CM | POA: Diagnosis not present

## 2023-05-10 MED ORDER — CARVEDILOL 6.25 MG PO TABS: 6.2500 mg | ORAL_TABLET | Freq: Two times a day (BID) | ORAL | 1 refills | Status: DC

## 2023-05-10 NOTE — Patient Instructions (Addendum)
Medication Instructions:  Your physician recommends that you continue on your current medications as directed. Please refer to the Current Medication list given to you today.    *If you need a refill on your cardiac medications before your next appointment, please call your pharmacy*   Lab Work: None    If you have labs (blood work) drawn today and your tests are completely normal, you will receive your results only by: MyChart Message (if you have MyChart) OR A paper copy in the mail If you have any lab test that is abnormal or we need to change your treatment, we will call you to review the results.   Testing/Procedures: None    Follow-Up: At [facility], you and your health needs are our priority.  As part of our continuing mission to provide you with exceptional heart care, we have created designated Provider Care Teams.  These Care Teams include your primary Cardiologist (physician) and Advanced Practice Providers (APPs -  Physician Assistants and Nurse Practitioners) who all work together to provide you with the care you need, when you need it.  We recommend signing up for the patient portal called "MyChart".  Sign up information is provided on this After Visit Summary.  MyChart is used to connect with patients for Virtual Visits (Telemedicine).  Patients are able to view lab/test results, encounter notes, upcoming appointments, etc.  Non-urgent messages can be sent to your provider as well.   To learn more about what you can do with MyChart, go to [email or web address].    Your next appointment:   3 month(s)  The format for your next appointment:   In Person  Provider:   Reatha Harps, MD    Other Instructions Dr. Flora Lipps has referred you to [facility] Neurology with Dr. Naomie Dean, MD.

## 2023-05-11 ENCOUNTER — Telehealth: Payer: Self-pay

## 2023-05-11 NOTE — Telephone Encounter (Signed)
Received a new referral for this patient for worsening tremors. She has seen Dr Pearlean Brownie  in the past but would like to transfer her care to Dr Marjory Lies.  Please advise if this is acceptable.  Thanks!

## 2023-05-18 ENCOUNTER — Other Ambulatory Visit (HOSPITAL_COMMUNITY): Payer: Self-pay

## 2023-06-15 DIAGNOSIS — E039 Hypothyroidism, unspecified: Secondary | ICD-10-CM | POA: Diagnosis not present

## 2023-06-15 DIAGNOSIS — E7800 Pure hypercholesterolemia, unspecified: Secondary | ICD-10-CM | POA: Diagnosis not present

## 2023-06-15 DIAGNOSIS — E785 Hyperlipidemia, unspecified: Secondary | ICD-10-CM | POA: Diagnosis not present

## 2023-06-15 DIAGNOSIS — M8580 Other specified disorders of bone density and structure, unspecified site: Secondary | ICD-10-CM | POA: Diagnosis not present

## 2023-06-15 DIAGNOSIS — I119 Hypertensive heart disease without heart failure: Secondary | ICD-10-CM | POA: Diagnosis not present

## 2023-06-16 DIAGNOSIS — L821 Other seborrheic keratosis: Secondary | ICD-10-CM | POA: Diagnosis not present

## 2023-06-16 DIAGNOSIS — L570 Actinic keratosis: Secondary | ICD-10-CM | POA: Diagnosis not present

## 2023-06-16 DIAGNOSIS — D225 Melanocytic nevi of trunk: Secondary | ICD-10-CM | POA: Diagnosis not present

## 2023-06-22 DIAGNOSIS — F4323 Adjustment disorder with mixed anxiety and depressed mood: Secondary | ICD-10-CM | POA: Diagnosis not present

## 2023-06-22 DIAGNOSIS — Z90710 Acquired absence of both cervix and uterus: Secondary | ICD-10-CM | POA: Diagnosis not present

## 2023-06-22 DIAGNOSIS — Z8673 Personal history of transient ischemic attack (TIA), and cerebral infarction without residual deficits: Secondary | ICD-10-CM | POA: Diagnosis not present

## 2023-06-22 DIAGNOSIS — E7800 Pure hypercholesterolemia, unspecified: Secondary | ICD-10-CM | POA: Diagnosis not present

## 2023-06-22 DIAGNOSIS — I10 Essential (primary) hypertension: Secondary | ICD-10-CM | POA: Diagnosis not present

## 2023-06-22 DIAGNOSIS — I25118 Atherosclerotic heart disease of native coronary artery with other forms of angina pectoris: Secondary | ICD-10-CM | POA: Diagnosis not present

## 2023-06-22 DIAGNOSIS — Z1339 Encounter for screening examination for other mental health and behavioral disorders: Secondary | ICD-10-CM | POA: Diagnosis not present

## 2023-06-22 DIAGNOSIS — I209 Angina pectoris, unspecified: Secondary | ICD-10-CM | POA: Diagnosis not present

## 2023-06-22 DIAGNOSIS — E039 Hypothyroidism, unspecified: Secondary | ICD-10-CM | POA: Diagnosis not present

## 2023-06-22 DIAGNOSIS — R82998 Other abnormal findings in urine: Secondary | ICD-10-CM | POA: Diagnosis not present

## 2023-06-22 DIAGNOSIS — K219 Gastro-esophageal reflux disease without esophagitis: Secondary | ICD-10-CM | POA: Diagnosis not present

## 2023-06-22 DIAGNOSIS — M8580 Other specified disorders of bone density and structure, unspecified site: Secondary | ICD-10-CM | POA: Diagnosis not present

## 2023-06-22 DIAGNOSIS — Z0000 Encounter for general adult medical examination without abnormal findings: Secondary | ICD-10-CM | POA: Diagnosis not present

## 2023-06-22 DIAGNOSIS — Z1331 Encounter for screening for depression: Secondary | ICD-10-CM | POA: Diagnosis not present

## 2023-07-06 ENCOUNTER — Other Ambulatory Visit: Payer: Self-pay | Admitting: Cardiovascular Disease

## 2023-07-08 DIAGNOSIS — H5213 Myopia, bilateral: Secondary | ICD-10-CM | POA: Diagnosis not present

## 2023-07-08 DIAGNOSIS — H524 Presbyopia: Secondary | ICD-10-CM | POA: Diagnosis not present

## 2023-07-08 DIAGNOSIS — H52223 Regular astigmatism, bilateral: Secondary | ICD-10-CM | POA: Diagnosis not present

## 2023-07-28 ENCOUNTER — Ambulatory Visit: Payer: Medicare PPO | Admitting: Neurology

## 2023-07-31 ENCOUNTER — Other Ambulatory Visit: Payer: Self-pay | Admitting: Cardiovascular Disease

## 2023-08-09 NOTE — Progress Notes (Unsigned)
 Cardiology Office Note:  .   Date:  08/11/2023  ID:  Tiffany Mayo, DOB 10-12-1940, MRN [id number] PCP: Gaspar Garbe, MD  [facility] Providers Cardiologist:  Reatha Harps, MD { History of Present Illness: .    Chief Complaint  Patient presents with   Follow-up    3 months.     Tiffany Mayo is a 83 y.o. female with history of CAD, HLD, HTN who presents for follow-up.    History of Present Illness   The patient is an 83 year old with coronary artery disease, hypertension, and hyperlipidemia who presents for follow-up.  She has been experiencing less frequent chest discomfort over the past couple of weeks, which occurs with increased physical activity and improves with rest and deep breathing. Her current medications include carvedilol 6.25 mg twice daily, Ranexa 500 mg twice daily, and Imdur 30 mg daily. She is also on Plavix and ASA.  She reports balance issues, attributing them to her medications causing drowsiness and dizziness. She is currently on seizure medications and was expected to be off them after six months, but it has been a year. She is awaiting an appointment with a neurologist, Dr. Geraldine Contras, on the 25th of this month.  She is concerned about tremors, particularly in her head, which have become more noticeable. She is awaiting further evaluation by a neurologist to determine the cause.  Her LDL cholesterol has increased to 80, and she is currently on Lipitor. She has been consuming an egg daily, which she acknowledges may have contributed to the increase. She is considering dietary changes to manage her cholesterol levels.          Problem List CAD -60% pLAD -mid LAD 80% -> PCI 07/21/2022 -LCX/OM1 90% -> PCI 07/21/2022 -95% D1 -PET MPI 07/2022 -> medium size, moderate defect mid to apical anterior/apical lateral (diagonal ischemia) 2. HLD  -T chol 120, HDL 43, LDL 65, TG 102 3. TIA 4. 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 Pertinent positives noted in the HPI.     Studies Reviewed: Marland Kitchen   EKG Interpretation Date/Time:  Thursday August 11 2023 08:42:51 EDT Ventricular Rate:  55 PR Interval:  164 QRS Duration:  78 QT Interval:  452 QTC Calculation: 432 R Axis:   -3  Text Interpretation: Sinus bradycardia When compared with ECG of 21-Apr-2023 11:44, No significant change was found Confirmed by Lennie Odor [phone]) on 08/11/2023 8:44:21 AM    LHC 04/21/2023 Angiographic data: RCA: Large-caliber vessel with mild disease in the midsegment at 30 to 40%.  RV branch is a focal 80% stenosis. LM: Large-caliber vessel, mildly calcified. LAD: Large vessel, mildly tortuous in the midsegment, proximal segment has a 60% stenosis.  Gives origin to a large D1 however it is 1 mm caliber vessel with a high-grade 99% stenosis in the midsegment, the D1 branch extends all the way to the anteroapical region and most consistent with abnormal nuclear stress test. Mid LAD 2.75 x 16 mm Synergy XD DES placed 07/21/2022 is widely patent. LCx: Large-caliber vessel, Previously placed 2.5 x 28 mm Synergy XD DES in the mid segment and distal segment is widely.  NM PET 03/09/2023   Reversible perfusion defect consistent with ischemia in apical to mid anterior and apical lateral wall.  Myocardial blood flow reserve unreliable in setting of prior PCI.   TID is present (1.14). Findings consistent with LAD  territory ischemia; does have known severe diagonal stenosis, which could be the cause of perfusion defect, but in setting of high risk finding (TID), would consider cardiac catheterization   LV perfusion is abnormal. There is evidence of ischemia. Defect 1: There is a medium defect with moderate reduction in uptake present in the apical to mid anterior and anterolateral location(s) that is reversible. There is normal wall motion in the defect area. Consistent with ischemia.   Rest left  ventricular function is normal. Rest EF: 56%. Stress left ventricular function is normal. Stress EF: 63%. End diastolic cavity size is normal. End systolic cavity size is normal.   Myocardial blood flow reserve is not reported in this patient due to technical or patient-specific concerns that affect accuracy (prior PCI)   Coronary calcium assessment not performed due to prior revascularization.   Findings are consistent with ischemia. The study is high risk.   Electronically signed by Epifanio Lesches, MD Physical Exam:   VS:  BP 124/64 (BP Location: Right Arm, Patient Position: Sitting, Cuff Size: Normal)   Pulse (!) 55   Ht 5\' 1"  (1.549 m)   Wt 143 lb (64.9 kg)   BMI 27.02 kg/m    Wt Readings from Last 3 Encounters:  08/11/23 143 lb (64.9 kg)  05/10/23 139 lb 9.6 oz (63.3 kg)  04/21/23 138 lb (62.6 kg)    GEN: Well nourished, well developed in no acute distress NECK: No JVD; No carotid bruits CARDIAC: RRR, no murmurs, rubs, gallops RESPIRATORY:  Clear to auscultation without rales, wheezing or rhonchi  ABDOMEN: Soft, non-tender, non-distended EXTREMITIES:  No edema; No deformity  ASSESSMENT AND PLAN: .   Assessment and Plan    Coronary Artery Disease (CAD) with Angina CAD with small diagonal branch managed medically. Decreased chest discomfort with activity. EKG shows sinus bradycardia, no acute ischemic changes. Discussed stopping aspirin after one year of dual antiplatelet therapy. Plavix monotherapy preferred. - Continue carvedilol 6.25 mg BID. - Continue Ranexa 500 mg BID. - Continue Imdur 30 mg daily. - Stop aspirin. - Continue Plavix.  Hyperlipidemia LDL increased to 80 mg/dL. Goal LDL around 55 mg/dL. Discussed Zetia vs. PCSK9 inhibitor. She prefers Zetia for ease of administration. Zetia expected to lower LDL with minimal side effects. - Add Zetia 10 mg daily. Continue lipitor 80 mg daily.  - Recheck lipids in 6 months. - Continue Lipitor.  Hypertension Blood  pressure well-controlled with current regimen.  Seizure Disorder Reports head tremors and drowsiness, possibly related to seizure medication. Awaiting neurology evaluation. - Follow up with neurology on the 25th.              Follow-up: Return in about 6 months (around 02/11/2024).  Signed, Lenna Gilford. Flora Lipps, MD, [facility]  [facility]  [address], [address] [place], [place] [address] [phone]  9:36 AM

## 2023-08-11 ENCOUNTER — Ambulatory Visit: Payer: Medicare PPO | Attending: Cardiovascular Disease | Admitting: Cardiovascular Disease

## 2023-08-11 ENCOUNTER — Encounter: Payer: Self-pay | Admitting: Cardiovascular Disease

## 2023-08-11 VITALS — BP 124/64 | HR 55 | Ht 61.0 in | Wt 143.0 lb

## 2023-08-11 DIAGNOSIS — I10 Essential (primary) hypertension: Secondary | ICD-10-CM

## 2023-08-11 DIAGNOSIS — I25118 Atherosclerotic heart disease of native coronary artery with other forms of angina pectoris: Secondary | ICD-10-CM

## 2023-08-11 DIAGNOSIS — E782 Mixed hyperlipidemia: Secondary | ICD-10-CM

## 2023-08-11 MED ORDER — EZETIMIBE 10 MG PO TABS: 10.0000 mg | ORAL_TABLET | Freq: Every day | ORAL | 3 refills | Status: DC

## 2023-08-11 NOTE — Patient Instructions (Signed)
 Medication Instructions:  - STOP Aspirin 81mg   - START ezitimibe (ZETIA) 10mg  daily     *If you need a refill on your cardiac medications before your next appointment, please call your pharmacy*   Lab Work: Your physician recommends that you return for lab work in 6 months. Please complete these labs 1 week before your next appointment.    If you have labs (blood work) drawn today and your tests are completely normal, you will receive your results only by: MyChart Message (if you have MyChart) OR A paper copy in the mail If you have any lab test that is abnormal or we need to change your treatment, we will call you to review the results.   Testing/Procedures: None    Follow-Up: At [facility], you and your health needs are our priority.  As part of our continuing mission to provide you with exceptional heart care, we have created designated Provider Care Teams.  These Care Teams include your primary Cardiologist (physician) and Advanced Practice Providers (APPs -  Physician Assistants and Nurse Practitioners) who all work together to provide you with the care you need, when you need it.  We recommend signing up for the patient portal called "MyChart".  Sign up information is provided on this After Visit Summary.  MyChart is used to connect with patients for Virtual Visits (Telemedicine).  Patients are able to view lab/test results, encounter notes, upcoming appointments, etc.  Non-urgent messages can be sent to your provider as well.   To learn more about what you can do with MyChart, go to [email or web address].    Your next appointment:   6 month(s)  The format for your next appointment:   In Person  Provider:   Reatha Harps, MD    Other Instructions

## 2023-08-23 ENCOUNTER — Ambulatory Visit: Payer: Medicare PPO | Admitting: Diagnostic Neuroimaging

## 2023-08-23 ENCOUNTER — Encounter: Payer: Self-pay | Admitting: Diagnostic Neuroimaging

## 2023-08-23 VITALS — BP 114/69 | HR 65 | Ht 63.0 in | Wt 141.0 lb

## 2023-08-23 DIAGNOSIS — G250 Essential tremor: Secondary | ICD-10-CM

## 2023-08-23 MED ORDER — LEVETIRACETAM ER 500 MG PO TB24: 500.0000 mg | ORAL_TABLET | Freq: Every day | ORAL | 4 refills | Status: AC

## 2023-08-23 NOTE — Progress Notes (Signed)
 [facility]  PATIENT: Tiffany Mayo DOB: 1941/04/23  REFERRING CLINICIAN: Sande Rives, * HISTORY FROM: patient  REASON FOR VISIT: new consult   HISTORICAL  CHIEF COMPLAINT:  Chief Complaint  Patient presents with   New Patient (Initial Visit)    Patient in room #7 with her son [place]. Patient states she been having some shaking of the head. Patient she would like to discuss her Rx Ranolazine.    HISTORY OF PRESENT ILLNESS:   83 year old female here for evaluation of head tremor.  Symptoms started around 2023 with mild intermittent tremor of the head.  Symptoms have gradually progressed over time.  No tremor in hands.  Has some neck pain issues.   REVIEW OF SYSTEMS: Full 14 system review of systems performed and negative with exception of: as per HPI.  ALLERGIES: Allergies  Allergen Reactions   Latex Rash   Codeine Nausea And Vomiting   Sulfa Antibiotics Nausea And Vomiting    HOME MEDICATIONS: Outpatient Medications Prior to Visit  Medication Sig Dispense Refill   acetaminophen (TYLENOL) 500 MG tablet Take 500 mg by mouth every 6 (six) hours as needed (pain.).     atorvastatin (LIPITOR) 80 MG tablet Take 80 mg by mouth in the morning.     carvedilol (COREG) 6.25 MG tablet Take 1 tablet (6.25 mg total) by mouth 2 (two) times daily. 180 tablet 3   clopidogrel (PLAVIX) 75 MG tablet Take 1 tablet (75 mg total) by mouth daily. 30 tablet 7   escitalopram (LEXAPRO) 5 MG tablet Take 5 mg by mouth in the morning.     ezetimibe (ZETIA) 10 MG tablet Take 1 tablet (10 mg total) by mouth daily. 90 tablet 3   isosorbide mononitrate (IMDUR) 30 MG 24 hr tablet Take 1 tablet (30 mg total) by mouth daily. 90 tablet 3   levothyroxine (SYNTHROID) 50 MCG tablet Take 50 mcg by mouth every other day. Alternate with 75 mg     levothyroxine (SYNTHROID) 75 MCG tablet Take 75 mcg by mouth every other day. Alternate with 50 mg     pantoprazole (PROTONIX) 40 MG tablet  Take 1 tablet (40 mg total) by mouth in the morning. 90 tablet 3   ranolazine (RANEXA) 500 MG 12 hr tablet Take 1 tablet (500 mg total) by mouth 2 (two) times daily. 180 tablet 3   levETIRAcetam (KEPPRA XR) 500 MG 24 hr tablet Take 1 tablet (500 mg total) by mouth daily. 90 tablet 3   nitroGLYCERIN (NITROSTAT) 0.4 MG SL tablet Place 1 tablet (0.4 mg total) under the tongue every 5 (five) minutes as needed for chest pain. 25 tablet 1   No facility-administered medications prior to visit.      PHYSICAL EXAM  GENERAL EXAM/CONSTITUTIONAL: Vitals:  Vitals:   08/23/23 0953  BP: 114/69  Pulse: 65  Weight: 141 lb (64 kg)  Height: 5\' 3"  (1.6 m)   Body mass index is 24.98 kg/m. Wt Readings from Last 3 Encounters:  08/23/23 141 lb (64 kg)  08/11/23 143 lb (64.9 kg)  05/10/23 139 lb 9.6 oz (63.3 kg)   Patient is in no distress; well developed, nourished and groomed NECK ROM SLIGHTLY LIMITED WITH HEAD TURN TO LEFT; FULL ROM WITH HEAD TURN TO RIGHT  CARDIOVASCULAR: Examination of carotid arteries is normal; no carotid bruits Regular rate and rhythm, no murmurs Examination of peripheral vascular system by observation and palpation is normal  EYES: Ophthalmoscopic exam of optic discs and posterior segments  is normal; no papilledema or hemorrhages No results found.  MUSCULOSKELETAL: Gait, strength, tone, movements noted in Neurologic exam below  NEUROLOGIC: MENTAL STATUS:      No data to display         awake, alert, oriented to person, place and time recent and remote memory intact normal attention and concentration language fluent, comprehension intact, naming intact fund of knowledge appropriate  CRANIAL NERVE:  2nd - no papilledema on fundoscopic exam 2nd, 3rd, 4th, 6th - pupils equal and reactive to light, visual fields full to confrontation, extraocular muscles intact, no nystagmus 5th - facial sensation symmetric 7th - facial strength symmetric 8th - hearing  intact 9th - palate elevates symmetrically, uvula midline 11th - shoulder shrug symmetric 12th - tongue protrusion midline MILD SIDE-SIDE HEAD TREMOR (INTERMITTENT)  MOTOR:  normal bulk and tone, full strength in the BUE, BLE NO TREMOR IN ARMS  SENSORY:  normal and symmetric to light touch, temperature, vibration  COORDINATION:  finger-nose-finger, fine finger movements normal  REFLEXES:  deep tendon reflexes TREMOR and symmetric  GAIT/STATION:  narrow based gait     DIAGNOSTIC DATA (LABS, IMAGING, TESTING) - I reviewed patient records, labs, notes, testing and imaging myself where available.  Lab Results  Component Value Date   WBC 3.3 (L) 04/18/2023   HGB 12.6 04/18/2023   HCT 38.1 04/18/2023   MCV 98 (H) 04/18/2023   PLT 169 04/18/2023      Component Value Date/Time   NA 140 04/18/2023 1510   K 4.8 04/18/2023 1510   CL 103 04/18/2023 1510   CO2 23 04/18/2023 1510   GLUCOSE 88 04/18/2023 1510   GLUCOSE 95 07/22/2022 0242   BUN 19 04/18/2023 1510   CREATININE 1.22 (H) 04/18/2023 1510   CALCIUM 9.2 04/18/2023 1510   PROT 7.3 04/07/2022 1921   ALBUMIN 4.4 04/07/2022 1921   AST 20 04/07/2022 1921   ALT 14 04/07/2022 1921   ALKPHOS 47 04/07/2022 1921   BILITOT 0.4 04/07/2022 1921   GFRNONAA 55 (L) 07/22/2022 0242   GFRAA >60 08/04/2016 1833   Lab Results  Component Value Date   CHOL 125 11/17/2022   HDL 45 11/17/2022   LDLCALC 62 11/17/2022   TRIG 93 11/17/2022   CHOLHDL 2.8 11/17/2022   Lab Results  Component Value Date   HGBA1C 5.0 04/08/2022   No results found for: "VITAMINB12" Lab Results  Component Value Date   TSH 4.968 (H) 04/08/2022      ASSESSMENT AND PLAN  83 y.o. year old female here with:   Dx:  1. Benign head tremor   2. Essential tremor      PLAN:  HEAD TREMOR (since ~2023) - likely mild essential tremor vs mild cervical dystonia associated tremor - monitor for now; may consider primidone, propranolol or botox in  future if symptoms significantly worsen  SEIZURE PREVENTION - continue levetiracetam XR 500mg  daily  Meds ordered this encounter  Medications   levETIRAcetam (KEPPRA XR) 500 MG 24 hr tablet    Sig: Take 1 tablet (500 mg total) by mouth daily.    Dispense:  90 tablet    Refill:  4   Return in about 1 year (around 08/22/2024) for with NP Margie Ege), MyChart visit (15 min).    Suanne Marker, MD 08/23/2023, 10:43 AM Certified in Neurology, Neurophysiology and Neuroimaging  [facility] [address], [address] [place], [place] [address] [phone]

## 2023-08-23 NOTE — Patient Instructions (Signed)
 HEAD TREMOR (since ~2023) - likely mild essential tremor vs mild cervical dystonia associated tremor - monitor for now; may consider primidone, propranolol or botox in future if symptoms significantly worsen  SEIZURE PREVENTION - continue levetiracetam XR 500mg  daily

## 2023-08-26 DIAGNOSIS — S161XXA Strain of muscle, fascia and tendon at neck level, initial encounter: Secondary | ICD-10-CM | POA: Diagnosis not present

## 2023-08-26 DIAGNOSIS — M47812 Spondylosis without myelopathy or radiculopathy, cervical region: Secondary | ICD-10-CM | POA: Diagnosis not present

## 2023-08-26 DIAGNOSIS — M7542 Impingement syndrome of left shoulder: Secondary | ICD-10-CM | POA: Diagnosis not present

## 2023-08-26 DIAGNOSIS — M25512 Pain in left shoulder: Secondary | ICD-10-CM | POA: Diagnosis not present

## 2023-08-30 ENCOUNTER — Ambulatory Visit: Payer: Medicare PPO | Admitting: Neurology

## 2023-08-30 ENCOUNTER — Other Ambulatory Visit: Payer: Self-pay | Admitting: Cardiovascular Disease

## 2023-09-13 ENCOUNTER — Telehealth: Payer: Self-pay | Admitting: Cardiovascular Disease

## 2023-09-13 DIAGNOSIS — M545 Low back pain, unspecified: Secondary | ICD-10-CM | POA: Diagnosis not present

## 2023-09-13 NOTE — Telephone Encounter (Signed)
 Attempted to call patient, no answer left message requesting a call back.

## 2023-09-13 NOTE — Telephone Encounter (Signed)
 Patient identification verified by 2 forms. Hilton Lucky, RN    Called and spoke to patient  Patient states:   -she will be starting prednisone per [facility]   -prednisone is being given for sciatic nerve pain   -she will be on taking it for 5 days  Informed patient message sent to pharmacy for assistance

## 2023-09-13 NOTE — Telephone Encounter (Signed)
This is ok to take

## 2023-09-13 NOTE — Telephone Encounter (Signed)
 Patient said that she is fixing to be prescribed Prednisone and she wants to know if there are anything that she not be taking

## 2023-09-14 NOTE — Telephone Encounter (Signed)
 Pt informed of providers result. Pt verbalized understanding. All questions, if any, were answered.

## 2023-09-14 NOTE — Telephone Encounter (Signed)
 Pt is returning call.

## 2023-09-14 NOTE — Telephone Encounter (Addendum)
 2nd attempt to call patient, no answer left message requesting a call back.

## 2023-09-15 ENCOUNTER — Emergency Department (HOSPITAL_BASED_OUTPATIENT_CLINIC_OR_DEPARTMENT_OTHER)
Admission: EM | Admit: 2023-09-15 | Discharge: 2023-09-15 | Disposition: A | Discharge status: Home / Self Care | Attending: Emergency Medicine | Admitting: Emergency Medicine

## 2023-09-15 ENCOUNTER — Emergency Department (HOSPITAL_BASED_OUTPATIENT_CLINIC_OR_DEPARTMENT_OTHER)

## 2023-09-15 ENCOUNTER — Other Ambulatory Visit: Payer: Self-pay

## 2023-09-15 ENCOUNTER — Encounter (HOSPITAL_BASED_OUTPATIENT_CLINIC_OR_DEPARTMENT_OTHER): Payer: Self-pay | Admitting: Emergency Medicine

## 2023-09-15 DIAGNOSIS — S0083XA Contusion of other part of head, initial encounter: Secondary | ICD-10-CM | POA: Diagnosis not present

## 2023-09-15 DIAGNOSIS — W010XXA Fall on same level from slipping, tripping and stumbling without subsequent striking against object, initial encounter: Secondary | ICD-10-CM | POA: Diagnosis not present

## 2023-09-15 DIAGNOSIS — I2510 Atherosclerotic heart disease of native coronary artery without angina pectoris: Secondary | ICD-10-CM | POA: Diagnosis not present

## 2023-09-15 DIAGNOSIS — Z23 Encounter for immunization: Secondary | ICD-10-CM | POA: Insufficient documentation

## 2023-09-15 DIAGNOSIS — Z7902 Long term (current) use of antithrombotics/antiplatelets: Secondary | ICD-10-CM | POA: Insufficient documentation

## 2023-09-15 DIAGNOSIS — I10 Essential (primary) hypertension: Secondary | ICD-10-CM | POA: Diagnosis not present

## 2023-09-15 DIAGNOSIS — Z79890 Hormone replacement therapy (postmenopausal): Secondary | ICD-10-CM | POA: Diagnosis not present

## 2023-09-15 DIAGNOSIS — Z981 Arthrodesis status: Secondary | ICD-10-CM | POA: Diagnosis not present

## 2023-09-15 DIAGNOSIS — E039 Hypothyroidism, unspecified: Secondary | ICD-10-CM | POA: Insufficient documentation

## 2023-09-15 DIAGNOSIS — Z91040 Latex allergy status: Secondary | ICD-10-CM | POA: Insufficient documentation

## 2023-09-15 DIAGNOSIS — S0990XA Unspecified injury of head, initial encounter: Secondary | ICD-10-CM | POA: Diagnosis present

## 2023-09-15 DIAGNOSIS — S0003XA Contusion of scalp, initial encounter: Secondary | ICD-10-CM | POA: Diagnosis not present

## 2023-09-15 DIAGNOSIS — I6529 Occlusion and stenosis of unspecified carotid artery: Secondary | ICD-10-CM | POA: Diagnosis not present

## 2023-09-15 MED ORDER — TETANUS-DIPHTH-ACELL PERTUSSIS 5-2.5-18.5 LF-MCG/0.5 IM SUSY
0.5000 mL | PREFILLED_SYRINGE | Freq: Once | INTRAMUSCULAR | Status: AC
  Administered 2023-09-15: 0.5 mL via INTRAMUSCULAR
  Filled 2023-09-15: qty 0.5

## 2023-09-15 MED ORDER — BACITRACIN ZINC 500 UNIT/GM EX OINT
TOPICAL_OINTMENT | Freq: Every day | CUTANEOUS | Status: DC
  Administered 2023-09-15: 1 via TOPICAL
  Filled 2023-09-15: qty 28.35

## 2023-09-15 NOTE — ED Provider Notes (Signed)
 [place] EMERGENCY DEPARTMENT AT [facility] Provider Note   CSN: [id number] Arrival date & time: 09/15/23  1407     History  Chief Complaint  Patient presents with   Elizebeth Gums is a 83 y.o. female history of hypothyroidism, on plavix  post PCI for CAD, presents with concern for hitting her head on a trash can earlier today.  States she was pushing the trash can when it hit a uneven part in the driveway, causing her to run into the trash can.  She did hit her head but denies any loss of consciousness.  Did not fall to the ground.  She denies pain anywhere else besides the front of her head where she hit the trash can.  Denies any vision changes, nausea or vomiting, paresthesias.  Denies any chest pain, shortness of breath, or dizziness before or after this incident.  She is uncertain of when her last tetanus was.  HPI     Home Medications Prior to Admission medications   Medication Sig Start Date End Date Taking? Authorizing Provider  acetaminophen  (TYLENOL ) 500 MG tablet Take 500 mg by mouth every 6 (six) hours as needed (pain.).   Yes [provider]  atorvastatin  (LIPITOR ) 80 MG tablet Take 80 mg by mouth in the morning.   Yes [provider]  carvedilol  (COREG ) 6.25 MG tablet Take 1 tablet (6.25 mg total) by mouth 2 (two) times daily. 07/07/23  Yes O'Neal, Cathay Clonts, MD  clopidogrel  (PLAVIX ) 75 MG tablet Take 1 tablet (75 mg total) by mouth daily. 01/11/23 01/11/24 Yes Cleaver, Chet Cota, NP  escitalopram  (LEXAPRO ) 5 MG tablet Take 5 mg by mouth in the morning.   Yes [provider]  ezetimibe  (ZETIA ) 10 MG tablet Take 1 tablet (10 mg total) by mouth daily. 08/11/23 11/09/23 Yes O'Neal, Cathay Clonts, MD  isosorbide  mononitrate (IMDUR ) 30 MG 24 hr tablet Take 1 tablet (30 mg total) by mouth daily. 04/15/23  Yes O'Neal, Cathay Clonts, MD  levETIRAcetam  (KEPPRA  XR) 500 MG 24 hr tablet Take 1 tablet (500 mg total) by mouth daily. 08/23/23   Yes Penumalli, Vikram R, MD  levothyroxine  (SYNTHROID ) 50 MCG tablet Take 50 mcg by mouth every other day. Alternate with 75 mg   Yes [provider]  levothyroxine  (SYNTHROID ) 75 MCG tablet Take 75 mcg by mouth every other day. Alternate with 50 mg   Yes [provider]  pantoprazole  (PROTONIX ) 40 MG tablet Take 1 tablet (40 mg total) by mouth in the morning. 08/30/23  Yes O'Neal, Cathay Clonts, MD  predniSONE (DELTASONE) 5 MG tablet Take by mouth as directed. 09/13/23  Yes [provider]  ranolazine  (RANEXA ) 500 MG 12 hr tablet Take 1 tablet (500 mg total) by mouth 2 (two) times daily. 08/02/23  Yes O'Neal, Cathay Clonts, MD  tiZANidine  (ZANAFLEX ) 2 MG tablet Take 2 mg by mouth 3 (three) times daily. 08/26/23  Yes [provider]  nitroGLYCERIN  (NITROSTAT ) 0.4 MG SL tablet Place 1 tablet (0.4 mg total) under the tongue every 5 (five) minutes as needed for chest pain. 01/11/23 04/18/23  Carie Charity, NP      Allergies    Latex, Codeine, and Sulfa antibiotics    Review of Systems   Review of Systems  Physical Exam Updated Vital Signs BP 138/71 (BP Location: Left Arm)   Pulse (!) 53   Temp 97.7 F (36.5 C) (Oral)   Resp 16   Wt 61.7 kg  SpO2 100%   BMI 24.09 kg/m  Physical Exam Vitals and nursing note reviewed.  Constitutional:      General: She is not in acute distress.    Appearance: She is well-developed.  HENT:     Head: Normocephalic and atraumatic. No raccoon eyes or Battle's sign.     Comments: Skull nontender to palpation diffusely except for over the hematoma, no step-offs Eyes:     Extraocular Movements: Extraocular movements intact.     Conjunctiva/sclera: Conjunctivae normal.     Pupils: Pupils are equal, round, and reactive to light.  Cardiovascular:     Rate and Rhythm: Normal rate and regular rhythm.     Heart sounds: No murmur heard. Pulmonary:     Effort: Pulmonary effort is normal. No respiratory distress.     Breath  sounds: Normal breath sounds.  Abdominal:     Palpations: Abdomen is soft.     Tenderness: There is no abdominal tenderness.  Musculoskeletal:        General: No swelling.     Cervical back: Neck supple.     Comments: No cervical, thoracic, or lumbar spinal tenderness to palpation.  No tenderness palpation of the chest wall diffusely.  Nontender to the upper or lower extremities bilaterally diffusely.  Able to ambulate without difficulty.  Full range of motion of the upper and lower extremities bilaterally.  Skin:    General: Skin is warm and dry.     Capillary Refill: Capillary refill takes less than 2 seconds.     Comments: Hematoma of the left side of the forehead with overlying small skin tear.  No active bleeding or foreign debris.  Neurological:     General: No focal deficit present.     Mental Status: She is alert.     Comments: Intact sensation of the bilateral upper and lower extremities  5/5 strength with hand squeeze bilaterally, ankle plantarflexion and dorsiflexion bilaterally  Psychiatric:        Mood and Affect: Mood normal.     ED Results / Procedures / Treatments   Labs (all labs ordered are listed, but only abnormal results are displayed) Labs Reviewed - No data to display  EKG EKG Interpretation Date/Time:  Thursday September 15 2023 14:18:27 EDT Ventricular Rate:  61 PR Interval:  174 QRS Duration:  76 QT Interval:  412 QTC Calculation: 414 R Axis:   40  Text Interpretation: Normal sinus rhythm Low voltage QRS ST & T wave abnormality, consider lateral ischemia Abnormal ECG When compared with ECG of 11-Aug-2023 08:42, No significant change was found Confirmed by Afton Horse [id number]) on 09/15/2023 2:35:31 PM  Radiology CT Head Wo Contrast Result Date: 09/15/2023 CLINICAL DATA:  Trauma, mechanical fall, on anticoagulation. Left forehead hematoma. EXAM: CT HEAD WITHOUT CONTRAST CT CERVICAL SPINE WITHOUT CONTRAST TECHNIQUE: Multidetector CT imaging of the  head and cervical spine was performed following the standard protocol without intravenous contrast. Multiplanar CT image reconstructions of the cervical spine were also generated. RADIATION DOSE REDUCTION: This exam was performed according to the departmental dose-optimization program which includes automated exposure control, adjustment of the mA and/or kV according to patient size and/or use of iterative reconstruction technique. COMPARISON:  MRI head 04/08/2022.  CTA head and neck 10/07/2022. FINDINGS: CT HEAD FINDINGS Brain: No acute intracranial hemorrhage. No CT evidence of acute infarct. No edema, mass effect, or midline shift. The basilar cisterns are patent. Ventricles: The ventricles are normal. Vascular: Atherosclerotic calcifications of the carotid siphons. No hyperdense vessel.  Skull: No acute or aggressive finding. Orbits: Orbits are symmetric. Sinuses: The visualized paranasal sinuses are clear. Other: Mastoid air cells are clear. Small left frontal scalp/forehead hematoma with associated laceration. CT CERVICAL SPINE FINDINGS Alignment: Alignment is maintained. No listhesis. No facet subluxation or dislocation. Skull base and vertebrae: No compression fracture or displaced fracture in the cervical spine. Anterior cervical fusion hardware at C5-6 with mature osseous fusion at this level. No suspicious osseous lesion. Soft tissues and spinal canal: No prevertebral fluid or swelling. No visible canal hematoma. Disc levels: Intervertebral disc space narrowing at multiple levels. Small disc bulges throughout the cervical spine. No high-grade osseous spinal canal stenosis. There is focal prominent mineralization of the ligamentum flavum at C6-7 contributing to mild spinal canal stenosis. Facet arthrosis at multiple levels. Foraminal stenosis is most pronounced on the left at C3-4. Upper chest: Negative. Other: None. IMPRESSION: No CT evidence of acute intracranial abnormality. No acute fracture or  traumatic malalignment of the cervical spine. Small left frontal scalp/forehead hematoma. Degenerative changes of the cervical spine as above. Electronically Signed   By: Denny Flack M.D.   On: 09/15/2023 17:23   CT Cervical Spine Wo Contrast Result Date: 09/15/2023 CLINICAL DATA:  Trauma, mechanical fall, on anticoagulation. Left forehead hematoma. EXAM: CT HEAD WITHOUT CONTRAST CT CERVICAL SPINE WITHOUT CONTRAST TECHNIQUE: Multidetector CT imaging of the head and cervical spine was performed following the standard protocol without intravenous contrast. Multiplanar CT image reconstructions of the cervical spine were also generated. RADIATION DOSE REDUCTION: This exam was performed according to the departmental dose-optimization program which includes automated exposure control, adjustment of the mA and/or kV according to patient size and/or use of iterative reconstruction technique. COMPARISON:  MRI head 04/08/2022.  CTA head and neck 10/07/2022. FINDINGS: CT HEAD FINDINGS Brain: No acute intracranial hemorrhage. No CT evidence of acute infarct. No edema, mass effect, or midline shift. The basilar cisterns are patent. Ventricles: The ventricles are normal. Vascular: Atherosclerotic calcifications of the carotid siphons. No hyperdense vessel. Skull: No acute or aggressive finding. Orbits: Orbits are symmetric. Sinuses: The visualized paranasal sinuses are clear. Other: Mastoid air cells are clear. Small left frontal scalp/forehead hematoma with associated laceration. CT CERVICAL SPINE FINDINGS Alignment: Alignment is maintained. No listhesis. No facet subluxation or dislocation. Skull base and vertebrae: No compression fracture or displaced fracture in the cervical spine. Anterior cervical fusion hardware at C5-6 with mature osseous fusion at this level. No suspicious osseous lesion. Soft tissues and spinal canal: No prevertebral fluid or swelling. No visible canal hematoma. Disc levels: Intervertebral disc  space narrowing at multiple levels. Small disc bulges throughout the cervical spine. No high-grade osseous spinal canal stenosis. There is focal prominent mineralization of the ligamentum flavum at C6-7 contributing to mild spinal canal stenosis. Facet arthrosis at multiple levels. Foraminal stenosis is most pronounced on the left at C3-4. Upper chest: Negative. Other: None. IMPRESSION: No CT evidence of acute intracranial abnormality. No acute fracture or traumatic malalignment of the cervical spine. Small left frontal scalp/forehead hematoma. Degenerative changes of the cervical spine as above. Electronically Signed   By: Denny Flack M.D.   On: 09/15/2023 17:23    Procedures Procedures    Medications Ordered in ED Medications  bacitracin  ointment (1 Application Topical Given 09/15/23 1504)  Tdap (BOOSTRIX ) injection 0.5 mL (0.5 mLs Intramuscular Given 09/15/23 1504)    ED Course/ Medical Decision Making/ A&P Clinical Course as of 09/15/23 1736  Thu Sep 15, 2023  1420 Went to evaluate patient,  not in room  [AF]    Clinical Course User Index [AF] Rexie Catena, PA-C                                 Medical Decision Making Amount and/or Complexity of Data Reviewed Radiology: ordered.  Risk OTC drugs. Prescription drug management.     Differential diagnosis includes but is not limited to intracranial hemorrhage, hematoma, laceration, abrasion, fracture  ED Course:  Upon initial evaluation, patient is very well-appearing, stable vital signs.  Has a hematoma to the left side of her forehead with small skin tear.  No cervical, thoracic, or lumbar spinal tenderness to palpation.  Skull nontender to palpation diffusely with no step-offs noted.  No neurologic deficits.  Able to move all extremities without difficulty. Patient states she took Tylenol  about an hour and a half ago, reports pain well-controlled.  Imaging Studies ordered: I ordered imaging studies including CT head,  CT cervical spine I independently visualized the imaging with scope of interpretation limited to determining acute life threatening conditions related to emergency care. Imaging showed no acute abnormalities.  Hematoma to the left forehead I agree with the radiologist interpretation   Cardiac Monitoring: / EKG: The patient was maintained on a cardiac monitor.  I personally viewed and interpreted the cardiac monitored which showed an underlying rhythm of: Normal sinus rhythm    Medications Given: Tdap  Upon re-evaluation, patient still well-appearing, stable vital signs.  Patient's skin tear was cleaned and bacitracin  applied. Patient's tetanus was updated.  She has good story for mechanical fall, no concern for other etiology at this time.  CT of the head and cervical spine unremarkable.  Stable and appropriate for discharge home.    Impression: Forehead hematoma  Disposition:  The patient was discharged home with instructions to follow-up with PCP within the next week for recheck of symptoms.  Clean skin tear on her forehead with soap and water, apply antibiotic ointment such as bacitracin  or Neosporin to the area until scabbed over.  Tylenol  as needed for pain. Return precautions given.    This chart was dictated using voice recognition software, Dragon. Despite the best efforts of this provider to proofread and correct errors, errors may still occur which can change documentation meaning.          Final Clinical Impression(s) / ED Diagnoses Final diagnoses:  Traumatic hematoma of forehead, initial encounter    Rx / DC Orders ED Discharge Orders     None         Rexie Catena, PA-C 09/15/23 1736    Nicklas Barns, MD 09/16/23 [phone]

## 2023-09-15 NOTE — ED Triage Notes (Signed)
 Pt was pushing her garbage bin, she tripped and fell into the garbage bin. Her left forehead has large hematoma.scrapes to hands and knees. Headache, pt is on plavix. Happened around 1pm.

## 2023-09-15 NOTE — Discharge Instructions (Addendum)
 Your CT of the head did not show any signs of a brain bleed or skull fracture.  No signs of injury to the cervical spine (spine in your neck).  The bump on your forehead will resolve with time.  Please clean the area with soap and water and apply antibiotic ointment such as Neosporin or bacitracin to the ointment to help this skin tear heal.  Your tetanus shot was updated today.  This is good for the next 10 years.  You may take up to 1000mg  of tylenol every 6 hours as needed for pain.  Do not take more then 4g per day.  Please follow-up with your PCP for recheck of symptoms within the next week.  Return to the ER for any vision changes, vomiting, consciousness, severe headache, any other new or concerning symptoms.

## 2023-09-28 DIAGNOSIS — M4807 Spinal stenosis, lumbosacral region: Secondary | ICD-10-CM | POA: Diagnosis not present

## 2023-09-28 DIAGNOSIS — M5416 Radiculopathy, lumbar region: Secondary | ICD-10-CM | POA: Diagnosis not present

## 2023-10-18 DIAGNOSIS — M5416 Radiculopathy, lumbar region: Secondary | ICD-10-CM | POA: Diagnosis not present

## 2023-11-18 ENCOUNTER — Telehealth: Payer: Self-pay | Admitting: Cardiovascular Disease

## 2023-11-18 NOTE — Telephone Encounter (Signed)
 Pt c/o BP issue: STAT if pt c/o blurred vision, one-sided weakness or slurred speech.  STAT if BP is GREATER than 180/120 TODAY.  STAT if BP is LESS than 90/60 and SYMPTOMATIC TODAY  1. What is your BP concern?   Patient is concerned she is having increasing high BP readings  2. Have you taken any BP medication today?  Yes - except Coreg   3. What are your last 5 BP readings?  127/61 today 130/?? (5 min after first reading and patient could not remember the bottom number)  4. Are you having any other symptoms (ex. Dizziness, headache, blurred vision, passed out)?   Patient stated she is having a little bit of pressure in her chest  Patient stated she found a Coreg  pill on the floor and wants to know if she should take another dose.  Patient noted she had unexpected guests and she is very anxious.

## 2023-11-18 NOTE — Telephone Encounter (Signed)
 Worried that she missed a dose of Coreg - morning dose? Not sure, her bp is 128/66. Informed her that since she is not even sure if she missed the dose, then do not take one- bc then it would be an extra dose and it may drop bp too low.   Told her to resume regular schedule for Coreg  this evening. She verbalized understanding.

## 2023-11-26 ENCOUNTER — Other Ambulatory Visit: Payer: Self-pay | Admitting: General Practice

## 2023-11-28 NOTE — Telephone Encounter (Signed)
 Rx refill sent to pharmacy.

## 2023-12-14 DIAGNOSIS — M8580 Other specified disorders of bone density and structure, unspecified site: Secondary | ICD-10-CM | POA: Diagnosis not present

## 2023-12-14 DIAGNOSIS — F4323 Adjustment disorder with mixed anxiety and depressed mood: Secondary | ICD-10-CM | POA: Diagnosis not present

## 2023-12-14 DIAGNOSIS — I25118 Atherosclerotic heart disease of native coronary artery with other forms of angina pectoris: Secondary | ICD-10-CM | POA: Diagnosis not present

## 2023-12-14 DIAGNOSIS — E7800 Pure hypercholesterolemia, unspecified: Secondary | ICD-10-CM | POA: Diagnosis not present

## 2023-12-14 DIAGNOSIS — K219 Gastro-esophageal reflux disease without esophagitis: Secondary | ICD-10-CM | POA: Diagnosis not present

## 2023-12-14 DIAGNOSIS — E039 Hypothyroidism, unspecified: Secondary | ICD-10-CM | POA: Diagnosis not present

## 2023-12-14 DIAGNOSIS — Z8673 Personal history of transient ischemic attack (TIA), and cerebral infarction without residual deficits: Secondary | ICD-10-CM | POA: Diagnosis not present

## 2023-12-14 DIAGNOSIS — I10 Essential (primary) hypertension: Secondary | ICD-10-CM | POA: Diagnosis not present

## 2023-12-14 DIAGNOSIS — I209 Angina pectoris, unspecified: Secondary | ICD-10-CM | POA: Diagnosis not present

## 2024-01-02 ENCOUNTER — Telehealth: Payer: Self-pay | Admitting: Cardiovascular Disease

## 2024-01-02 NOTE — Progress Notes (Unsigned)
 Cardiology Office Note:    Date:  01/03/2024   ID:  Tiffany Mayo, DOB [birth year], MRN [id number]  PCP:  Vernadine Charlie ORN, MD  Cardiologist:  [name], MD  Electrophysiologist:  None   Referring MD: Vernadine Charlie ORN, MD   Chief Complaint  Patient presents with   Chest Pain    History of Present Illness:    Tiffany Mayo is a 83 y.o. female with a hx of CAD, hypertension, hyperlipidemia.  She follows with [name], presents today for acute visit.  Echocardiogram 12/2022 showed EF 60 to 65%, normal RV function, mild to moderate AI, mild aortic stenosis.  Stress PET 03/2023 showed apical to mid anterior and apical lateral wall ischemia, with TID.  Cath 04/2023 showed 99% D1 stenosis (small vessel), patent LAD stent, 80% stenosis in RV RV branch, 60% proximal LAD.  Medical management recommended.  She reports has been having pain in center of chest, describes as pressure but sometimes burning.  Worse with exertion.  Reports improves with nitroglycerin .  Yesterday had to take 2 nitroglycerin  for chest pain to resolve.    Past Medical History:  Diagnosis Date   Arthritis    spine, neck   Coronary artery disease    GERD (gastroesophageal reflux disease)    Headache    SINUS    Hemorrhoid    Hyperlipemia    Hypertension    Hypothyroidism    Seizures (HCC)     Past Surgical History:  Procedure Laterality Date   ABDOMINAL HYSTERECTOMY  1990   ANTERIOR CERVICAL DECOMP/DISCECTOMY FUSION  04/03/2012   Procedure: ANTERIOR CERVICAL DECOMPRESSION/DISCECTOMY FUSION 1 LEVEL;  Surgeon: Lamar ORN Peaches, MD;  Location: [facility];  Service: Neurosurgery;  Laterality: N/A;  Cervical Five-Six Anterior Cervical Decompression with Fusion Plating and Bonegraft   BUNIONECTOMY     RIGHT FOOT   CARDIAC CATHETERIZATION     CORONARY PRESSURE/FFR STUDY N/A 04/21/2023   Procedure: CORONARY PRESSURE/FFR STUDY;  Surgeon: Ladona Heinz, MD;  Location: [facility] INVASIVE CV LAB;  Service:  Cardiovascular;  Laterality: N/A;   CORONARY STENT INTERVENTION N/A 07/21/2022   Procedure: CORONARY STENT INTERVENTION;  Surgeon: Wonda Sharper, MD;  Location: [facility] INVASIVE CV LAB;  Service: Cardiovascular;  Laterality: N/A;   EYE SURGERY     Lasik, Catarct   GANGLION CYST EXCISION     BIL WRIST    JOINT REPLACEMENT     Right middle finger   LEFT HEART CATH AND CORONARY ANGIOGRAPHY N/A 07/08/2022   Procedure: LEFT HEART CATH AND CORONARY ANGIOGRAPHY;  Surgeon: Burnard Debby LABOR, MD;  Location: [facility] INVASIVE CV LAB;  Service: Cardiovascular;  Laterality: N/A;   LEFT HEART CATH AND CORONARY ANGIOGRAPHY N/A 04/21/2023   Procedure: LEFT HEART CATH AND CORONARY ANGIOGRAPHY;  Surgeon: Ladona Heinz, MD;  Location: [facility] INVASIVE CV LAB;  Service: Cardiovascular;  Laterality: N/A;   TRIGGER FINGER RELEASE     thumbs    Current Medications: Current Meds  Medication Sig   acetaminophen  (TYLENOL ) 500 MG tablet Take 500 mg by mouth every 6 (six) hours as needed (pain.).   atorvastatin  (LIPITOR ) 80 MG tablet Take 80 mg by mouth in the morning.   carvedilol  (COREG ) 6.25 MG tablet Take 1 tablet (6.25 mg total) by mouth 2 (two) times daily.   clopidogrel  (PLAVIX ) 75 MG tablet Take 1 tablet (75 mg total) by mouth daily.   escitalopram  (LEXAPRO ) 5 MG tablet Take 5 mg by mouth in the morning.   ezetimibe  (  ZETIA ) 10 MG tablet Take 1 tablet (10 mg total) by mouth daily.   isosorbide  mononitrate (IMDUR ) 60 MG 24 hr tablet Take 1 tablet (60 mg total) by mouth daily.   levETIRAcetam  (KEPPRA  XR) 500 MG 24 hr tablet Take 1 tablet (500 mg total) by mouth daily.   levothyroxine  (SYNTHROID ) 50 MCG tablet Take 50 mcg by mouth every other day. Alternate with 75 mg   levothyroxine  (SYNTHROID ) 75 MCG tablet Take 75 mcg by mouth every other day. Alternate with 50 mg   nitroGLYCERIN  (NITROSTAT ) 0.4 MG SL tablet Place 1 tablet (0.4 mg total) under the tongue every 5 (five) minutes as needed for chest pain.   pantoprazole   (PROTONIX ) 40 MG tablet Take 1 tablet (40 mg total) by mouth in the morning.   ranolazine  (RANEXA ) 500 MG 12 hr tablet Take 1 tablet (500 mg total) by mouth 2 (two) times daily.   tiZANidine  (ZANAFLEX ) 2 MG tablet Take 2 mg by mouth 3 (three) times daily.   [DISCONTINUED] isosorbide  mononitrate (IMDUR ) 30 MG 24 hr tablet Take 1 tablet (30 mg total) by mouth daily.     Allergies:   Latex, Sulfasalazine, Codeine, and Sulfa antibiotics   Social History   Socioeconomic History   Marital status: Married    Spouse name: Not on file   Number of children: 2   Years of education: Not on file   Highest education level: Not on file  Occupational History   Occupation: Runner, broadcasting/film/video First Grade - Retired  Tobacco Use   Smoking status: Never   Smokeless tobacco: Never  Substance and Sexual Activity   Alcohol  use: Yes    Comment: OCC WINE    Drug use: No   Sexual activity: Not on file  Other Topics Concern   Not on file  Social History Narrative   Not on file   Social Drivers of Health   Financial Resource Strain: Not on file  Food Insecurity: Low Risk  (08/21/2022)   Received from [facility]   Food Insecurity    Worried About Running Out of Food in the Last Year: Not on file    Within the past 12 months, the food you bought just didn't last and you didn't have money to get more.: Never true  Transportation Needs: Low Risk  (08/21/2022)   Received from [facility] [facility] & [facility]   Transportation    In the past 12 months, has lack of transportation kept you from medical appointments or from getting medications?: No    Lack of Transportation (Non-Medical): Not on file  Physical Activity: Not on file  Stress: Not on file  Social Connections: Not on file     Family History: The patient's family history includes Heart disease in her father.  ROS:   Please see the history of present illness.     All other systems reviewed and are negative.  EKGs/Labs/Other Studies  Reviewed:    The following studies were reviewed today:   EKG:   01/03/2024: Normal sinus rhythm, rate 63, poor R wave progression  Recent Labs: 04/18/2023: BUN 19; Creatinine, Ser 1.22; Hemoglobin 12.6; Platelets 169; Potassium 4.8; Sodium 140  Recent Lipid Panel    Component Value Date/Time   CHOL 125 11/17/2022 0856   TRIG 93 11/17/2022 0856   HDL 45 11/17/2022 0856   CHOLHDL 2.8 11/17/2022 0856   CHOLHDL 4.6 04/08/2022 0000   VLDL 21 04/08/2022 0000   LDLCALC 62 11/17/2022 0856  Physical Exam:    VS:  BP 128/68   Pulse 63   Ht 5' 3 (1.6 m)   Wt 143 lb 8 oz (65.1 kg)   SpO2 94%   BMI 25.42 kg/m     Wt Readings from Last 3 Encounters:  01/03/24 143 lb 8 oz (65.1 kg)  09/15/23 136 lb (61.7 kg)  08/23/23 141 lb (64 kg)     GEN:  Well nourished, well developed in no acute distress HEENT: Normal NECK: No JVD; No carotid bruits CARDIAC: RRR, no murmurs, rubs, gallops RESPIRATORY:  Clear to auscultation without rales, wheezing or rhonchi  ABDOMEN: Soft, non-tender, non-distended MUSCULOSKELETAL:  No edema; No deformity  SKIN: Warm and dry NEUROLOGIC:  Alert and oriented x 3 PSYCHIATRIC:  Normal affect   ASSESSMENT:    1. Coronary artery disease of native artery of native heart with stable angina pectoris (HCC)   2. Mixed hyperlipidemia   3. Essential hypertension    PLAN:    CAD with stable angina: Echocardiogram 12/2022 showed EF 60 to 65%, normal RV function, mild to moderate AI, mild aortic stenosis.  Stress PET 03/2023 showed apical to mid anterior and apical lateral wall ischemia, with TID.  Cath 04/2023 showed 99% D1 stenosis (small vessel), patent LAD stent, 80% stenosis in RV RV branch, 60% proximal LAD.  Medical management recommended. - Continue Plavix  - Continue Lipitor  80 mg daily and Zetia  10 mg daily - Continue carvedilol  6.25 mg twice daily, Ranexa  500 mg twice daily.  She is reporting chest pain concerning for typical angina, describes  exertional chest pressure that improves with nitroglycerin .  Suspect due to known severe diagonal disease.  She is currently on Imdur  30 mg daily, will increase to 60 mg daily  Hyperlipidemia: On Zetia  10 mg daily and Lipitor  80 mg daily.  Check lipid panel  Hypertension: On carvedilol  6.25 mg twice daily and Imdur  30 mg daily.  Plan increase Imdur  to 60 mg daily as above  RTC in 1 month with Dr. Barbaraann or APP   Medication Adjustments/Labs and Tests Ordered: Current medicines are reviewed at length with the patient today.  Concerns regarding medicines are outlined above.  Orders Placed This Encounter  Procedures   Lipid panel   EKG 12-Lead   Meds ordered this encounter  Medications   isosorbide  mononitrate (IMDUR ) 60 MG 24 hr tablet    Sig: Take 1 tablet (60 mg total) by mouth daily.    Dispense:  90 tablet    Refill:  3    Patient Instructions  Medication Instructions:  Increase Imdur  60 mg daily *If you need a refill on your cardiac medications before your next appointment, please call your pharmacy*  Lab Work: FASTING LIPID PANEL If you have labs (blood work) drawn today and your tests are completely normal, you will receive your results only by: MyChart Message (if you have MyChart) OR A paper copy in the mail If you have any lab test that is abnormal or we need to change your treatment, we will call you to review the results.  Testing/Procedures: none  Follow-Up: At [facility] [place], you and your health needs are our priority.  As part of our continuing mission to provide you with exceptional heart care, our providers are all part of one team.  This team includes your primary Cardiologist (physician) and Advanced Practice Providers or APPs (Physician Assistants and Nurse Practitioners) who all work together to provide you with the care you need, when you  need it.  Your next appointment:   1 month(s)  Provider:   [name], MD or APP  We recommend  signing up for the patient portal called MyChart.  Sign up information is provided on this After Visit Summary.  MyChart is used to connect with patients for Virtual Visits (Telemedicine).  Patients are able to view lab/test results, encounter notes, upcoming appointments, etc.  Non-urgent messages can be sent to your provider as well.   To learn more about what you can do with MyChart, go to [email or web address].   Other Instructions NONE       Signed, Lonni LITTIE Nanas, MD  01/03/2024 5:42 PM    [facility]

## 2024-01-02 NOTE — Telephone Encounter (Signed)
 Pain in chest for past week or so- dull- pressure/ache- when she takes a deep breath and lets the breath out slow, it seems to decrease the pressure.   The pain comes and goes.  Took NTG about 15 minutes ago- it decreased the pain, but did not go away completely. She reports that she is still having some pain. Asked her to go ahead and take a second one- took it at 1:29. At 1:32 she reports that the pain is gone.    She has had headache and some coughing- feels like it is more allergy related- more like a tickle in her throat, like some drainage. Some shortness of breath only with activity- but not while she is having chest pain.   Appt made with Dr Kate- DOD 01/03/24 at 1:40.   If you experience active Chest Pain, including tightness, pressure, jaw pain, radiating pain to shoulder/upper arm/back, Chest Pain unrelieved by 2-3 doses of Nitroglycerin , Shortness Of Breath, nausea, vomiting, and/or sweating- THEN CALL 911 AND GO TO THE NEAREST EMERGENCY ROOM  She verbalized understanding of all information.

## 2024-01-02 NOTE — Telephone Encounter (Signed)
   Pt c/o of Chest Pain: STAT if active CP, including tightness, pressure, jaw pain, radiating pain to shoulder/upper arm/back, CP unrelieved by Nitro. Symptoms reported of SOB, nausea, vomiting, sweating.  1. Are you having CP right now? She took a nitroglycerin  and it took the pain away.      2. Are you experiencing any other symptoms (ex. SOB, nausea, vomiting, sweating)? Headaches, coughing.   3. Is your CP continuous or coming and going? Comes and goes for the past week.    4. Have you taken Nitroglycerin ? Yes, about 5 mins ago and it took the pain away.    5. How long have you been experiencing CP? For about a week.     6. If NO CP at time of call then end call with telling Pt to call back or call 911 if Chest pain returns prior to return call from triage team.

## 2024-01-03 ENCOUNTER — Encounter: Payer: Self-pay | Admitting: Cardiology

## 2024-01-03 ENCOUNTER — Ambulatory Visit: Attending: Cardiology | Admitting: Cardiology

## 2024-01-03 VITALS — BP 128/68 | HR 63 | Ht 63.0 in | Wt 143.5 lb

## 2024-01-03 DIAGNOSIS — I25118 Atherosclerotic heart disease of native coronary artery with other forms of angina pectoris: Secondary | ICD-10-CM

## 2024-01-03 DIAGNOSIS — I10 Essential (primary) hypertension: Secondary | ICD-10-CM

## 2024-01-03 DIAGNOSIS — E782 Mixed hyperlipidemia: Secondary | ICD-10-CM | POA: Diagnosis not present

## 2024-01-03 MED ORDER — ISOSORBIDE MONONITRATE ER 60 MG PO TB24: 60.0000 mg | ORAL_TABLET | Freq: Every day | ORAL | 3 refills | Status: AC

## 2024-01-03 NOTE — Patient Instructions (Addendum)
 Medication Instructions:  Increase Imdur  60 mg daily *If you need a refill on your cardiac medications before your next appointment, please call your pharmacy*  Lab Work: FASTING LIPID PANEL If you have labs (blood work) drawn today and your tests are completely normal, you will receive your results only by: MyChart Message (if you have MyChart) OR A paper copy in the mail If you have any lab test that is abnormal or we need to change your treatment, we will call you to review the results.  Testing/Procedures: none  Follow-Up: At [facility], you and your health needs are our priority.  As part of our continuing mission to provide you with exceptional heart care, our providers are all part of one team.  This team includes your primary Cardiologist (physician) and Advanced Practice Providers or APPs (Physician Assistants and Nurse Practitioners) who all work together to provide you with the care you need, when you need it.  Your next appointment:   1 month(s)  Provider:   [name], MD or APP  We recommend signing up for the patient portal called MyChart.  Sign up information is provided on this After Visit Summary.  MyChart is used to connect with patients for Virtual Visits (Telemedicine).  Patients are able to view lab/test results, encounter notes, upcoming appointments, etc.  Non-urgent messages can be sent to your provider as well.   To learn more about what you can do with MyChart, go to [email or web address].   Other Instructions NONE

## 2024-01-10 ENCOUNTER — Other Ambulatory Visit: Payer: Self-pay | Admitting: Internal Medicine

## 2024-01-10 DIAGNOSIS — Z1231 Encounter for screening mammogram for malignant neoplasm of breast: Secondary | ICD-10-CM

## 2024-01-25 ENCOUNTER — Ambulatory Visit
Admission: RE | Admit: 2024-01-25 | Discharge: 2024-01-25 | Disposition: A | Discharge status: Home / Self Care | Source: Ambulatory Visit | Attending: Internal Medicine | Admitting: Internal Medicine

## 2024-01-25 DIAGNOSIS — Z1231 Encounter for screening mammogram for malignant neoplasm of breast: Secondary | ICD-10-CM

## 2024-01-26 DIAGNOSIS — Z23 Encounter for immunization: Secondary | ICD-10-CM | POA: Diagnosis not present

## 2024-01-30 DIAGNOSIS — J0110 Acute frontal sinusitis, unspecified: Secondary | ICD-10-CM | POA: Diagnosis not present

## 2024-02-01 ENCOUNTER — Other Ambulatory Visit: Payer: Self-pay | Admitting: *Deleted

## 2024-02-01 DIAGNOSIS — E782 Mixed hyperlipidemia: Secondary | ICD-10-CM | POA: Diagnosis not present

## 2024-02-01 NOTE — Progress Notes (Signed)
 Cardiology Office Note    Date:  02/04/2024  ID:  Tiffany Mayo, DOB 1940/08/21, MRN [id number] PCP:  Vernadine Charlie ORN, MD  Cardiologist:  [name], MD  Electrophysiologist:  None   Chief Complaint: Follow up for CAD   History of Present Illness: .    Tiffany Mayo is a 83 y.o. female with visit-pertinent history of CAD, hyperlipidemia and history of TIA.  On 07/01/2022 she underwent coronary CTA that demonstrated significant two-vessel disease.  Cardiac catheterization performed 07/08/2022 demonstrated 80% RV branch lesion, 80% D1 lesion, 95% D2 lesion, 80% proximal to mid LAD lesion, 60% proximal LAD lesion, 80% proximal to mid left circumflex lesion, 90% OM2 lesion, EF normal.  Patient opted to undergo PCI instead of being considered for CABG.  Repeat coronary catheterization performed on 07/21/2022 demonstrated severe mid LAD lesion treated with PTCA and stenting using 2.75 x 16 mm Synergy DES, severe mid left circumflex and first OM lesion treated with single 2.25 x 28 mm Synergy DES.  Postprocedure patient was placed on aspirin  and Plavix  with plan to run the dual antiplatelet therapy for minimum 6 months.  In March 2024 she was seen in [name] emergency room in [place]  for possible TIA versus seizure disorder.  CT showed no acute abnormality of the brain, no intracranial bleeding, small vessel occlusive disease in the periventricular white matter of the cerebral hemispheres, no masses.  MRI of the brain was negative for acute changes, she was placed on Keppra .  She was seen by Dr. [name] for follow-up in June 2024, she is doing well at that time.  She noted some low energy and fatigue that was felt to be side effect of medications, she has weaned off of her Imdur .   On 01/11/2023 she was seen by [name], NP, she reported intermittent burning sensation in her chest since stopping Imdur .  She was placed back on 15 mg daily of Imdur .  Repeat echocardiogram  obtained on 01/18/2023 demonstrated EF 60 to 65%, no RWMA, normal RV, mild to moderate AI, mild aortic stenosis.  Per cardiac rehab notes, on 02/07/2023 for patient had 6 minutes of chest discomfort with exercise.  She was seen in clinic on 02/23/2023 where she reported that for the past 6 weeks she had continued to have some chest discomfort.  Her olmesartan  was decreased down to 10 mg daily and her Imdur  was increased to 30 mg daily.  It was recommended that she obtain a PET stress test to assess for any large area of ischemia with plan that if there was only a small area of ischemia near the diagonal artery territory then would likely pursue medical management.  However if there was a large area of ischemia may need to consider repeat PCI.  PET stress on 03/09/2023 indicated reversible perfusion defect consistent with ischemia in apical to mid anterior and apical lateral wall, 3 times daily was present, finding consistent with LAD territory ischemia, noted that she has known severe diagonal stenosis, which could be the cause of perfusion defect however cardiac catheterization was recommended in setting of high risk findings.  Patient was seen in clinic in 04/2023 by [name], recommended proceeding with cardiac catheterization, cardiac catheterization 04/21/2023 indicated proximal LAD stenosis of 60% that was not hemodynamically significant by IFR and FFR.  Noted that high-grade stenosis of a very large distribution D1 however very small caliber at 1.5 mm in diameter at most as such would recommend  continued medical therapy and she was started on carvedilol  6.25 mg twice daily.  Patient was continued on carvedilol  6.25 mg twice daily, Ranexa  500 mg twice daily and Imdur  30 mg daily.  Patient was seen in clinic on 01/03/2024 by Dr. [name] for chest pain, reported having pain in the center of her chest, described as pressure but also was burning and was worse with exertion.  Noted improved with nitroglycerin , day  prior to appointment had taken 2 nitroglycerin  for chest pain to resolve.  Patient's Imdur  was increased to 60 mg daily.  Today she presents for follow-up.  She reports that she is doing well, notes she currently has terrible allergy problems. She notes that her chest pressure has significantly improved, she feels she is back to her normal. She has nasal congestion currently but reports her breathing has been doing well.  She denies any palpitations, lower extremity edema, orthopnea or PND.  She denies any presyncope or syncope.  She reports that she has been tolerating Imdur  60 mg daily well.  ROS: .   Today she denies chest pain, shortness of breath, lower extremity edema, fatigue, palpitations, melena, hematuria, hemoptysis, diaphoresis, weakness, presyncope, syncope, orthopnea, and PND.  All other systems are reviewed and otherwise negative. Studies Reviewed: [name]   EKG:  EKG is not ordered today.  CV Studies: Cardiac studies reviewed are outlined and summarized above. Otherwise please see EMR for full report. Cardiac Studies & Procedures   ______________________________________________________________________________________________ CARDIAC CATHETERIZATION  CARDIAC CATHETERIZATION 04/21/2023  Conclusion Images from the original result were not included. Left Heart Catheterization 04/21/23: Hemodynamic data: LV 190/10, EDP 27 mmHg.  Ao 187/71, mean 116 mmHg.  No pressure gradient across the aortic valve.  Angiographic data: RCA: Large-caliber vessel with mild disease in the midsegment at 30 to 40%.  RV branch is a focal 80% stenosis. LM: Large-caliber vessel, mildly calcified. LAD: Large vessel, mildly tortuous in the midsegment, proximal segment has a 60% stenosis.  Gives origin to a large D1 however it is 1 mm caliber vessel with a high-grade 99% stenosis in the midsegment, the D1 branch extends all the way to the anteroapical region and most consistent with abnormal nuclear stress test.  Mid LAD 2.75 x 16 mm Synergy XD DES placed 07/21/2022 is widely patent. LCx: Large-caliber vessel, Previously placed 2.5 x 28 mm Synergy XD DES in the mid segment and distal segment is widely.    Impression and recommendations: The proximal LAD stenosis of 60% is not hemodynamically significant by IFR and also FFR.  Abnormal stress test and ongoing chest pain related to high-grade stenosis of a very large distribution D1 however very small caliber at around 1.5 mm in diameter at most.  Hence would recommend continued medical therapy, Coreg  6.25 has been added as antianginal therapy.  I have reassured her and she can continue her physical activity, if persistent symptoms, we can consider balloon angioplasty of D1 however with high risk of restenosis. Patient also was hypertensive throughout the procedure and EDP moderate to severely elevated.  Control of blood pressure may also help with her symptoms as well.  Findings Coronary Findings Diagnostic  Dominance: Right  Left Anterior Descending Prox LAD lesion is 60% stenosed. Non-stenotic Prox LAD to Mid LAD lesion was previously treated.  First Diagonal Branch Vessel is small in size. 1st Diag-1 lesion is 40% stenosed. 1st Diag-2 lesion is 99% stenosed.  Left Circumflex Non-stenotic Prox Cx to Mid Cx lesion was previously treated.  Second Biomedical engineer  Non-stenotic 2nd Mrg lesion was previously treated.  Right Coronary Artery Prox RCA lesion is 30% stenosed.  Right Ventricular Branch Vessel is small in size. RV Branch lesion is 80% stenosed.  Intervention  No interventions have been documented.   CARDIAC CATHETERIZATION  CARDIAC CATHETERIZATION 07/21/2022  Conclusion Severe mid LAD stenosis, treated successfully with PTCA and stenting using a 2.75 x 16 mm Synergy DES  Severe mid circumflex/first OM sequential stenoses, treated successfully with a single stent covering both lesions (2.5 x 28 mm Synergy  DES)  Recommendations: Overnight observation, DAPT with aspirin  and clopidogrel  6 months without interruption.  Findings Coronary Findings Diagnostic  Dominance: Right  Left Anterior Descending Prox LAD lesion is 60% stenosed. Prox LAD to Mid LAD lesion is 80% stenosed.  First Diagonal Branch Vessel is small in size. 1st Diag-1 lesion is 80% stenosed. 1st Diag-2 lesion is 95% stenosed.  Left Circumflex Prox Cx to Mid Cx lesion is 80% stenosed.  Second Obtuse Marginal Branch 2nd Mrg lesion is 90% stenosed.  Right Coronary Artery Prox RCA lesion is 30% stenosed.  Right Ventricular Branch Vessel is small in size. RV Branch lesion is 80% stenosed.  Intervention  Prox LAD to Mid LAD lesion Stent CATH LAUNCHER 6FR EBU3.5 guide catheter was inserted. Lesion crossed with guidewire using a WIRE COUGAR XT STRL 190CM. Pre-stent angioplasty was performed using a BALLN EMERGE MR 2.5X15. Maximum pressure:  12 atm. A drug-eluting stent was successfully placed using a SYNERGY XD 2.75X16. Maximum pressure: 16 atm. Post-stent angioplasty was performed using a BALLN [place] EMERGE MR 3.0X12. Maximum pressure:  18 atm. Post-Intervention Lesion Assessment The intervention was successful. Pre-interventional TIMI flow is 3. Post-intervention TIMI flow is 3. No complications occurred at this lesion. There is a 0% residual stenosis post intervention.  Prox Cx to Mid Cx lesion Stent CATH LAUNCHER 6FR EBU3.5 guide catheter was inserted. Lesion crossed with guidewire using a WIRE COUGAR XT STRL 190CM. Pre-stent angioplasty was performed using a BALLN EMERGE MR 2.5X15. A drug-eluting stent was successfully placed using a SYNERGY XD 2.50X28. Maximum pressure: 12 atm. Post-stent angioplasty was performed using a BALLN [place] EMERGE MR 3.0X12. Maximum pressure:  18 atm. Both lesions are predilated and then covered with a single 2.5 x 28 mm Synergy DES.  The distal portion of the stent and the smaller caliber OM is  postdilated with a 3.0 mm [place] balloon to 12 atm.  The more proximal aspects of the stent are dilated to 18 atm. Post-Intervention Lesion Assessment The intervention was successful. Pre-interventional TIMI flow is 3. Post-intervention TIMI flow is 3. No complications occurred at this lesion. There is a 0% residual stenosis post intervention.  2nd Mrg lesion Stent A drug-eluting stent was successfully placed using a SYNERGY XD 2.50X28. A single stent is used to cover both lesions. Post-Intervention Lesion Assessment The intervention was successful. Pre-interventional TIMI flow is 3. Post-intervention TIMI flow is 3. No complications occurred at this lesion. There is a 0% residual stenosis post intervention.   STRESS TESTS  NM PET CT CARDIAC PERFUSION MULTI W/ABSOLUTE BLOODFLOW 03/09/2023  Narrative   Reversible perfusion defect consistent with ischemia in apical to mid anterior and apical lateral wall.  Myocardial blood flow reserve unreliable in setting of prior PCI.   TID is present (1.14). Findings consistent with LAD territory ischemia; does have known severe diagonal stenosis, which could be the cause of perfusion defect, but in setting of high risk finding (TID), would consider cardiac catheterization   LV perfusion  is abnormal. There is evidence of ischemia. Defect 1: There is a medium defect with moderate reduction in uptake present in the apical to mid anterior and anterolateral location(s) that is reversible. There is normal wall motion in the defect area. Consistent with ischemia.   Rest left ventricular function is normal. Rest EF: 56%. Stress left ventricular function is normal. Stress EF: 63%. End diastolic cavity size is normal. End systolic cavity size is normal.   Myocardial blood flow reserve is not reported in this patient due to technical or patient-specific concerns that affect accuracy (prior PCI)   Coronary calcium  assessment not performed due to prior revascularization.    Findings are consistent with ischemia. The study is high risk.   Electronically signed by Lonni Nanas, MD  CLINICAL DATA:  This over-read does not include interpretation of cardiac or coronary anatomy or pathology. The interpretation by the cardiologist is attached.  COMPARISON:  None Available.  FINDINGS: Scout view is unremarkable. Atherosclerotic calcification of the aorta. Vague low-attenuation in the region of the falciform ligament of the liver (3/63). Gallstone. A few scattered pulmonary nodules measure up to 3 mm in the anterior segment right upper lobe (4/14).  IMPRESSION: 1. Pulmonary nodules measure 4 mm or less in size. No follow-up needed if patient is low-risk (and has no known or suspected primary neoplasm). Non-contrast chest CT can be considered in 12 months if patient is high-risk. This recommendation follows the consensus statement: Guidelines for Management of Incidental Pulmonary Nodules Detected on CT Images: From the Fleischner Society 2017; Radiology 2017; 284:228-243. 2. Vague low-attenuation in the region of the falciform ligament of the liver may be due to fat deposition. Evaluation is limited due to technique and lack of postcontrast imaging. Consider CT or MR abdomen without and with contrast in further evaluation. If a less aggressive approach is desired, initial evaluation with ultrasound could be performed. 3. Cholelithiasis. 4.  Aortic atherosclerosis (ICD10-I70.0).   Electronically Signed By: Newell Eke M.D. On: 03/09/2023 11:47   ECHOCARDIOGRAM  ECHOCARDIOGRAM COMPLETE 01/18/2023  Narrative ECHOCARDIOGRAM REPORT    Patient Name:   ANDREANNA MIKOLAJCZAK Date of Exam: 01/18/2023 Medical Rec #:  [id number]      Height:       63.0 in Accession #:    [id number]     Weight:       136.8 lb Date of Birth:  03/27/1941      BSA:          1.645 m Patient Age:    82 years       BP:           110/62 mmHg Patient Gender: F              HR:            55 bpm. Exam Location:  [address]  Procedure: 2D Echo, 3D Echo, Cardiac Doppler, Color Doppler and Strain Analysis  Indications:    I25.10 CAD  History:        Patient has prior history of Echocardiogram examinations, most recent 04/08/2022. CAD, Arrythmias:Bradycardia, Signs/Symptoms:Chest Pain and Dizziness/Lightheadedness; Risk Factors:Family History of Coronary Artery Disease, Hypertension and Dyslipidemia. Headaches.  Sonographer:    Heather Hawks RDCS Referring Phys: JOSEFA HERO CLEAVER  IMPRESSIONS   1. Left ventricular ejection fraction, by estimation, is 60 to 65%. The left ventricle has normal function. The left ventricle has no regional wall motion abnormalities. Left ventricular diastolic parameters were normal. The average left ventricular global longitudinal  strain is -19.7 %. The global longitudinal strain is normal. 2. Right ventricular systolic function is normal. The right ventricular size is normal. 3. The mitral valve is normal in structure. No evidence of mitral valve regurgitation. No evidence of mitral stenosis. 4. The aortic valve is calcified. There is mild calcification of the aortic valve. There is mild thickening of the aortic valve. Aortic valve regurgitation is mild to moderate. Mild aortic valve stenosis. Aortic regurgitation PHT measures 626 msec. Aortic valve area, by VTI measures 1.49 cm. Aortic valve mean gradient measures 8.7 mmHg. Aortic valve Vmax measures 2.09 m/s. 5. The inferior vena cava is normal in size with greater than 50% respiratory variability, suggesting right atrial pressure of 3 mmHg.  FINDINGS Left Ventricle: Left ventricular ejection fraction, by estimation, is 60 to 65%. The left ventricle has normal function. The left ventricle has no regional wall motion abnormalities. The average left ventricular global longitudinal strain is -19.7 %. The global longitudinal strain is normal. The left ventricular internal cavity size  was normal in size. There is no left ventricular hypertrophy. Left ventricular diastolic parameters were normal.  Right Ventricle: The right ventricular size is normal. No increase in right ventricular wall thickness. Right ventricular systolic function is normal.  Left Atrium: Left atrial size was normal in size.  Right Atrium: Right atrial size was normal in size.  Pericardium: There is no evidence of pericardial effusion. Presence of epicardial fat layer.  Mitral Valve: The mitral valve is normal in structure. No evidence of mitral valve regurgitation. No evidence of mitral valve stenosis.  Tricuspid Valve: The tricuspid valve is normal in structure. Tricuspid valve regurgitation is trivial. No evidence of tricuspid stenosis.  Aortic Valve: The aortic valve is calcified. There is mild calcification of the aortic valve. There is mild thickening of the aortic valve. There is moderate aortic valve annular calcification. Aortic valve regurgitation is mild to moderate. Aortic regurgitation PHT measures 626 msec. Mild aortic stenosis is present. Aortic valve mean gradient measures 8.7 mmHg. Aortic valve peak gradient measures 17.5 mmHg. Aortic valve area, by VTI measures 1.49 cm.  Pulmonic Valve: The pulmonic valve was normal in structure. Pulmonic valve regurgitation is not visualized. No evidence of pulmonic stenosis.  Aorta: The aortic root is normal in size and structure.  Venous: The inferior vena cava is normal in size with greater than 50% respiratory variability, suggesting right atrial pressure of 3 mmHg.  IAS/Shunts: No atrial level shunt detected by color flow Doppler.   LEFT VENTRICLE PLAX 2D LVIDd:         4.10 cm   Diastology LVIDs:         2.30 cm   LV e' medial:    6.53 cm/s LV PW:         1.00 cm   LV E/e' medial:  10.5 LV IVS:        0.70 cm   LV e' lateral:   6.09 cm/s LVOT diam:     2.00 cm   LV E/e' lateral: 11.3 LV SV:         74 LV SV Index:   45        2D  Longitudinal Strain LVOT Area:     3.14 cm  2D Strain GLS (A2C):   -19.9 % 2D Strain GLS (A3C):   -18.8 % 2D Strain GLS (A4C):   -20.3 % 2D Strain GLS Avg:     -19.7 %  3D Volume EF: 3D EF:  71 % LV EDV:       94 ml LV ESV:       27 ml LV SV:        66 ml  RIGHT VENTRICLE RV Basal diam:  3.30 cm RV S prime:     14.40 cm/s TAPSE (M-mode): 2.2 cm RVSP:           13.8 mmHg  LEFT ATRIUM             Index        RIGHT ATRIUM           Index LA diam:        3.30 cm 2.01 cm/m   RA Pressure: 3.00 mmHg LA Vol (A2C):   34.9 ml 21.21 ml/m  RA Area:     13.20 cm LA Vol (A4C):   39.1 ml 23.76 ml/m  RA Volume:   30.40 ml  18.48 ml/m LA Biplane Vol: 39.0 ml 23.70 ml/m AORTIC VALVE AV Area (Vmax):    1.46 cm AV Area (Vmean):   1.43 cm AV Area (VTI):     1.49 cm AV Vmax:           209.00 cm/s AV Vmean:          134.333 cm/s AV VTI:            0.499 m AV Peak Grad:      17.5 mmHg AV Mean Grad:      8.7 mmHg LVOT Vmax:         97.20 cm/s LVOT Vmean:        61.300 cm/s LVOT VTI:          0.236 m LVOT/AV VTI ratio: 0.47 AI PHT:            626 msec  AORTA Ao Root diam: 2.80 cm Ao Asc diam:  3.30 cm  MITRAL VALVE               TRICUSPID VALVE MV Area (PHT)  cm         TR Peak grad:   10.8 mmHg MV Decel Time: 220 msec    TR Vmax:        164.00 cm/s MV E velocity: 68.65 cm/s  Estimated RAP:  3.00 mmHg MV A velocity: 71.50 cm/s  RVSP:           13.8 mmHg MV E/A ratio:  0.96 SHUNTS Systemic VTI:  0.24 m Systemic Diam: 2.00 cm  Kardie Tobb DO Electronically signed by Dub Huntsman DO Signature Date/Time: 01/18/2023/12:03:50 PM    Final      CT SCANS  CT CORONARY MORPH W/CTA COR W/SCORE 07/01/2022  Addendum 07/02/2022  8:32 PM ADDENDUM REPORT: 07/02/2022 20:29  EXAM: OVER-READ INTERPRETATION  CT CHEST  The following report is an over-read performed by radiologist Dr. Fonda Mom [facility] on 07/02/2022. This over-read does not include  interpretation of cardiac or coronary anatomy or pathology. The coronary CTA interpretation by the cardiologist is attached.  COMPARISON:  None.  FINDINGS: No filling defects identified in pulmonary arteries to indicate PE. Dependent bibasilar subsegmental atelectasis. No pulmonary edema or evidence of pneumonia. No suspicious adenopathy identified. No pneumothorax or pleural effusion. Imaged osseous structures are intact. Imaged portions of the upper abdomen were unremarkable. Partially imaged lumbar spine fixation hardware noted on the scout view.  IMPRESSION: Dependent bibasilar subsegmental atelectasis. Otherwise no significant extracardiac findings identified.   Electronically Signed By: Fonda Field M.D. On: 07/02/2022  20:29  Narrative CLINICAL DATA:  58F with hypertension, hyperlipidemia, TIA and chest pain.  EXAM: Cardiac/Coronary  CT  TECHNIQUE: The patient was scanned on a [employer].  FINDINGS: A 120 kV prospective scan was triggered in the descending thoracic aorta at 111 HU's. Axial non-contrast 3 mm slices were carried out through the heart. The data set was analyzed on a dedicated work station and scored using the Agatson method. Gantry rotation speed was 250 msecs and collimation was .6 mm. No beta blockade and 0.8 mg of sl NTG was given. The 3D data set was reconstructed in 5% intervals of the 67-82 % of the R-R cycle. Diastolic phases were analyzed on a dedicated work station using MPR, MIP and VRT modes. The patient received 80 cc of contrast.  Aorta: Normal size. Ascending aorta 3.0 cm. Aortic atherosclerosis. No dissection.  Aortic Valve:  Trileaflet.  Aortic valve calcification.  Coronary Arteries:  Normal coronary origin.  Right dominance.  RCA is a large dominant artery that gives rise to PDA and PLVB. There is mild (25%) plaque in the proximal and mid RCA and moderate (50-69%) mixed plaque distally.  Left main is a large  artery that gives rise to LAD and LCX arteries.  LAD is a large vessel that has minimal (<25%) calcified plaque proximally. There is moderate (50-69%) calcified plaque distal to D1 and severe (>70%) plaque distal to D2. D1 has moderate mixed plaque at the ostium.  LCX is a non-dominant artery tthat has mild (25-49%) mixed plaque proximally and severe (>70%) mixed plaque in the mid vessel.  Coronary Calcium  Score:  Left main: 7.32  Left anterior descending artery: 317  Left circumflex artery: 47.6  Right coronary artery: 173  Total: 544  Percentile: 80th  Other findings:  Normal pulmonary vein drainage into the left atrium.  Normal let atrial appendage without a thrombus.  Normal size of the pulmonary artery.  IMPRESSION: 1. Coronary calcium  score of 544. This was 80th percentile for age-, race-, and sex-matched controls.  2. Normal coronary origin with right dominance.  3. There is severe (>70%) plaque in the LCX and LAD. There is moderate (50-69%) stenosis in the RCA. CAD-RADS 4.  4.  Will send study for FFRct.  5.  Aortic atherosclerosis.  6.  Aortic valve calcification.  Annabella Scarce, MD  Electronically Signed: By: Annabella Scarce M.D. On: 07/01/2022 17:51     ______________________________________________________________________________________________       Current Reported Medications:.    Current Meds  Medication Sig   acetaminophen  (TYLENOL ) 500 MG tablet Take 500 mg by mouth every 6 (six) hours as needed (pain.).   atorvastatin  (LIPITOR ) 80 MG tablet Take 80 mg by mouth in the morning.   carvedilol  (COREG ) 6.25 MG tablet Take 1 tablet (6.25 mg total) by mouth 2 (two) times daily.   clopidogrel  (PLAVIX ) 75 MG tablet Take 1 tablet (75 mg total) by mouth daily.   escitalopram  (LEXAPRO ) 5 MG tablet Take 5 mg by mouth in the morning.   ezetimibe  (ZETIA ) 10 MG tablet Take 1 tablet (10 mg total) by mouth daily.   isosorbide  mononitrate  (IMDUR ) 60 MG 24 hr tablet Take 1 tablet (60 mg total) by mouth daily.   levETIRAcetam  (KEPPRA  XR) 500 MG 24 hr tablet Take 1 tablet (500 mg total) by mouth daily.   levothyroxine  (SYNTHROID ) 50 MCG tablet Take 50 mcg by mouth every other day. Alternate with 75 mg   levothyroxine  (SYNTHROID ) 75 MCG tablet Take 75  mcg by mouth every other day. Alternate with 50 mg   nitroGLYCERIN  (NITROSTAT ) 0.4 MG SL tablet Place 1 tablet (0.4 mg total) under the tongue every 5 (five) minutes as needed for chest pain.   pantoprazole  (PROTONIX ) 40 MG tablet Take 1 tablet (40 mg total) by mouth in the morning.   predniSONE (DELTASONE) 5 MG tablet Take by mouth as directed.   ranolazine  (RANEXA ) 500 MG 12 hr tablet Take 1 tablet (500 mg total) by mouth 2 (two) times daily.   tiZANidine  (ZANAFLEX ) 2 MG tablet Take 2 mg by mouth 3 (three) times daily.    Physical Exam:    VS:  BP 126/66   Pulse 66   Ht 5' 3.5 (1.613 m)   Wt 141 lb (64 kg)   SpO2 97%   BMI 24.59 kg/m    Wt Readings from Last 3 Encounters:  02/03/24 141 lb (64 kg)  01/03/24 143 lb 8 oz (65.1 kg)  09/15/23 136 lb (61.7 kg)    GEN: Well nourished, well developed in no acute distress NECK: No JVD; No carotid bruits CARDIAC: RRR, no murmurs, rubs, gallops RESPIRATORY:  Clear to auscultation without rales, wheezing or rhonchi  ABDOMEN: Soft, non-tender, non-distended EXTREMITIES:  No edema; No acute deformity     Asessement and Plan:.    CAD:  Echocardiogram 12/2022 showed EF 60 to 65%, normal RV function, mild to moderate AI, mild aortic stenosis.  Stress PET 03/2023 showed apical to mid anterior and apical lateral wall ischemia, with TID.  Cath 04/2023 showed 99% D1 stenosis (small vessel), patent LAD stent, 80% stenosis in RV RV branch, 60% proximal LAD.  Medical management recommended.  Patient presented to office last month with increased chest pain, her Imdur  was increased to 60 mg daily.  Today she reports that she has returned to her  baseline, reports that her chest pressure has significantly improved. She denies shortness of breath.  Reviewed ED precautions.  Continue carvedilol  6.25 mg twice daily, Plavix  75 mg daily, Zetia  10 mg daily, Imdur  60 mg daily, Ranexa  500 mg twice daily.  HTN: Blood pressure today 126/66.  Continue current antihypertensive regimen.  HLD: Last lipid profile on 02/01/2024 indicated total cholesterol 112, HDL 49, triglycerides 83 and LDL 47.  Continue Lipitor  80 mg daily and Zetia  10 mg daily.   Disposition: F/u with Dr. Barbaraann in 4 months.   Signed, Micca Matura D Emmilia Sowder, NP

## 2024-02-02 ENCOUNTER — Ambulatory Visit: Payer: Self-pay | Admitting: Cardiology

## 2024-02-02 LAB — LIPID PANEL
Chol/HDL Ratio: 2.3 ratio (ref 0.0–4.4)
Cholesterol, Total: 112 mg/dL (ref 100–199)
HDL: 49 mg/dL (ref >39)
LDL Chol Calc (NIH): 47 mg/dL (ref 0–99)
Triglycerides: 83 mg/dL (ref 0–149)
VLDL Cholesterol Cal: 16 mg/dL (ref 5–40)

## 2024-02-03 ENCOUNTER — Ambulatory Visit: Attending: Cardiology | Admitting: Cardiology

## 2024-02-03 VITALS — BP 126/66 | HR 66 | Ht 63.5 in | Wt 141.0 lb

## 2024-02-03 DIAGNOSIS — I25118 Atherosclerotic heart disease of native coronary artery with other forms of angina pectoris: Secondary | ICD-10-CM | POA: Diagnosis not present

## 2024-02-03 DIAGNOSIS — E782 Mixed hyperlipidemia: Secondary | ICD-10-CM | POA: Diagnosis not present

## 2024-02-03 DIAGNOSIS — I10 Essential (primary) hypertension: Secondary | ICD-10-CM

## 2024-02-03 NOTE — Patient Instructions (Signed)
 Medication Instructions:   Your physician recommends that you continue on your current medications as directed. Please refer to the Current Medication list given to you today.  *If you need a refill on your cardiac medications before your next appointment, please call your pharmacy*  Lab Work: NONE ORDERED  TODAY   If you have labs (blood work) drawn today and your tests are completely normal, you will receive your results only by: MyChart Message (if you have MyChart) OR A paper copy in the mail If you have any lab test that is abnormal or we need to change your treatment, we will call you to review the results.  Testing/Procedures: NONE ORDERED  TODAY    Follow-Up: At [facility], you and your health needs are our priority.  As part of our continuing mission to provide you with exceptional heart care, our providers are all part of one team.  This team includes your primary Cardiologist (physician) and Advanced Practice Providers or APPs (Physician Assistants and Nurse Practitioners) who all work together to provide you with the care you need, when you need it.  Your next appointment:  3 month(s)  Provider:  [name], MD    We recommend signing up for the patient portal called MyChart.  Sign up information is provided on this After Visit Summary.  MyChart is used to connect with patients for Virtual Visits (Telemedicine).  Patients are able to view lab/test results, encounter notes, upcoming appointments, etc.  Non-urgent messages can be sent to your provider as well.   To learn more about what you can do with MyChart, go to [email or web address].   Other Instructions

## 2024-02-04 ENCOUNTER — Encounter: Payer: Self-pay | Admitting: Cardiology

## 2024-02-23 DIAGNOSIS — M1712 Unilateral primary osteoarthritis, left knee: Secondary | ICD-10-CM | POA: Diagnosis not present

## 2024-03-05 DIAGNOSIS — M25562 Pain in left knee: Secondary | ICD-10-CM | POA: Diagnosis not present

## 2024-03-08 DIAGNOSIS — M25562 Pain in left knee: Secondary | ICD-10-CM | POA: Diagnosis not present

## 2024-03-14 DIAGNOSIS — G319 Degenerative disease of nervous system, unspecified: Secondary | ICD-10-CM | POA: Diagnosis not present

## 2024-03-14 DIAGNOSIS — I25119 Atherosclerotic heart disease of native coronary artery with unspecified angina pectoris: Secondary | ICD-10-CM | POA: Diagnosis not present

## 2024-03-14 DIAGNOSIS — F325 Major depressive disorder, single episode, in full remission: Secondary | ICD-10-CM | POA: Diagnosis not present

## 2024-03-14 DIAGNOSIS — M1990 Unspecified osteoarthritis, unspecified site: Secondary | ICD-10-CM | POA: Diagnosis not present

## 2024-03-14 DIAGNOSIS — I509 Heart failure, unspecified: Secondary | ICD-10-CM | POA: Diagnosis not present

## 2024-03-14 DIAGNOSIS — I130 Hypertensive heart and chronic kidney disease with heart failure and stage 1 through stage 4 chronic kidney disease, or unspecified chronic kidney disease: Secondary | ICD-10-CM | POA: Diagnosis not present

## 2024-03-14 DIAGNOSIS — I739 Peripheral vascular disease, unspecified: Secondary | ICD-10-CM | POA: Diagnosis not present

## 2024-03-14 DIAGNOSIS — N1832 Chronic kidney disease, stage 3b: Secondary | ICD-10-CM | POA: Diagnosis not present

## 2024-03-14 DIAGNOSIS — G40909 Epilepsy, unspecified, not intractable, without status epilepticus: Secondary | ICD-10-CM | POA: Diagnosis not present

## 2024-03-15 DIAGNOSIS — S83242A Other tear of medial meniscus, current injury, left knee, initial encounter: Secondary | ICD-10-CM | POA: Diagnosis not present

## 2024-04-02 DIAGNOSIS — M1712 Unilateral primary osteoarthritis, left knee: Secondary | ICD-10-CM | POA: Diagnosis not present

## 2024-04-03 IMAGING — MG MM DIGITAL SCREENING BILAT W/ TOMO AND CAD
8 series · 8 of 24 positions shown · non-contrast
Comparison: Previous exam(s).

CLINICAL DATA: Screening.

EXAM:
DIGITAL SCREENING BILATERAL MAMMOGRAM WITH TOMOSYNTHESIS AND CAD
TECHNIQUE: Bilateral screening digital craniocaudal and mediolateral oblique
mammograms were obtained. Bilateral screening digital breast
tomosynthesis was performed. The images were evaluated with
computer-aided detection.

[R MLO synth-2D]
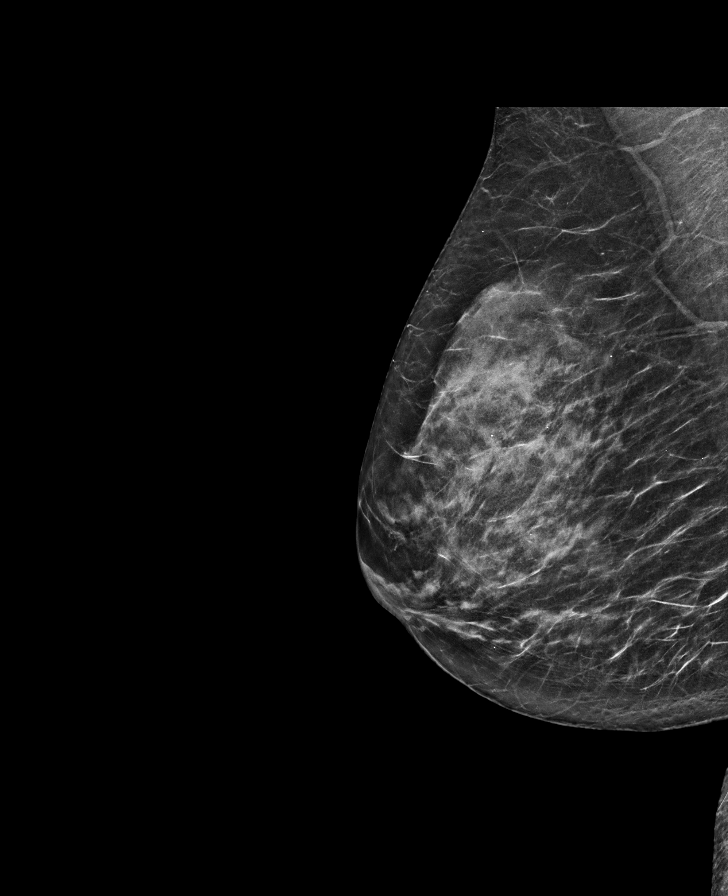

[L MLO synth-2D]
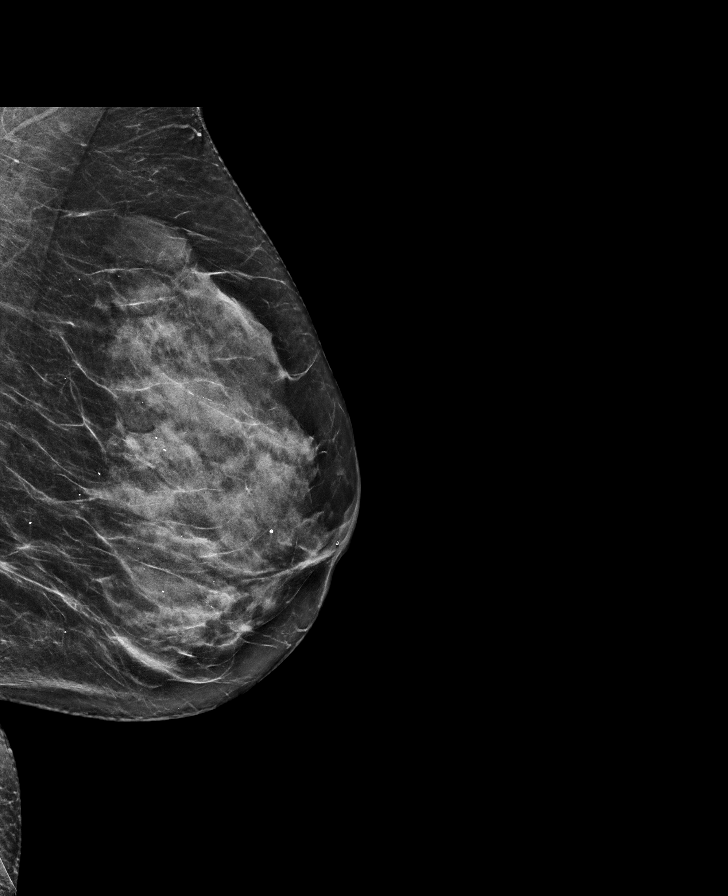

[L CC synth-2D]
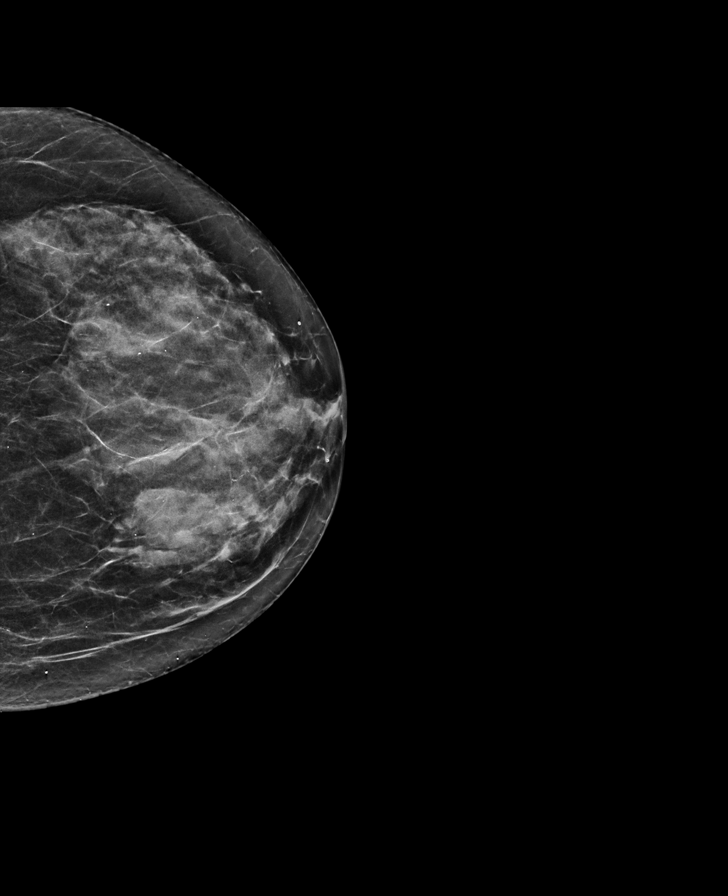

[R CC synth-2D]
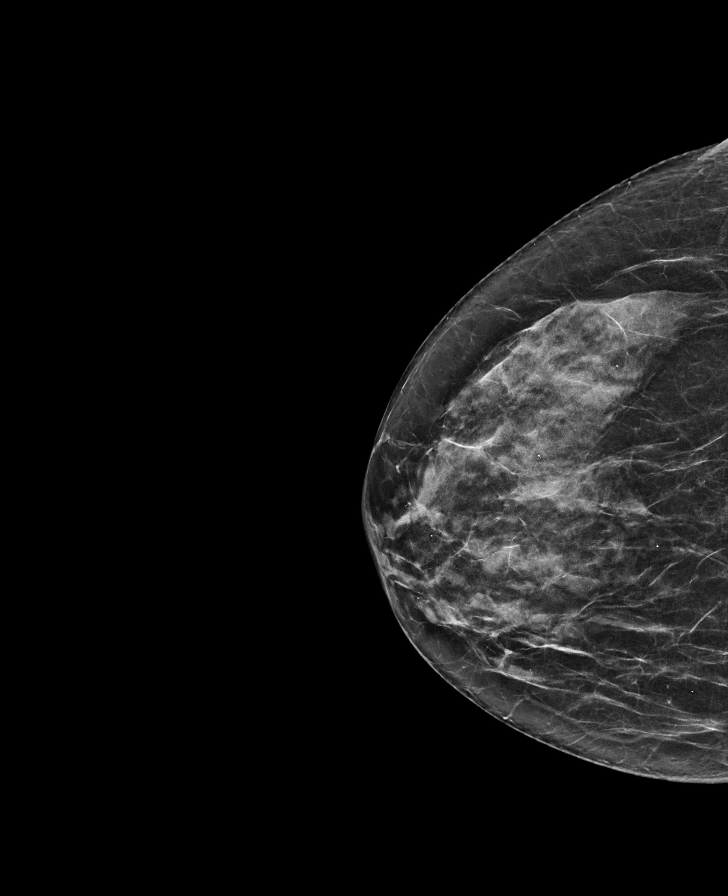

[L CC tomo · tomo slice 31/61.0]
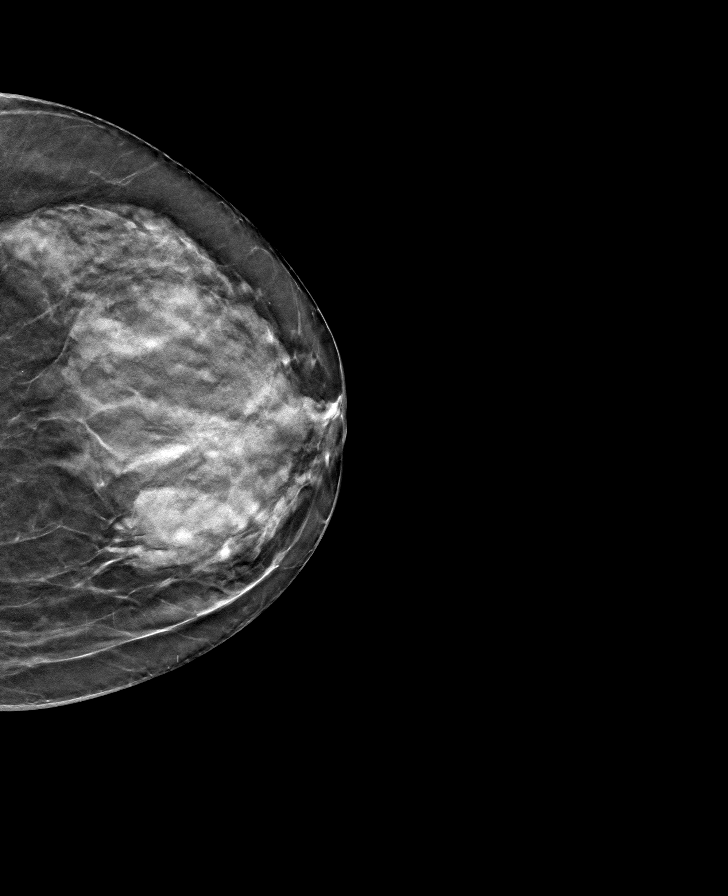

[R CC tomo · tomo slice 29/58.0]
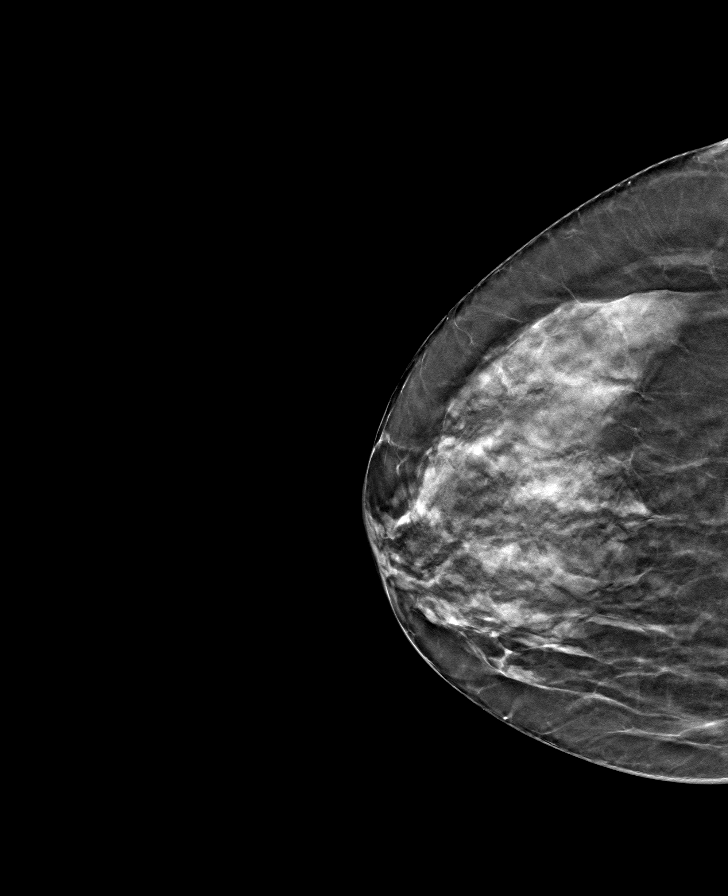

[R MLO tomo · tomo slice 31/60.0]
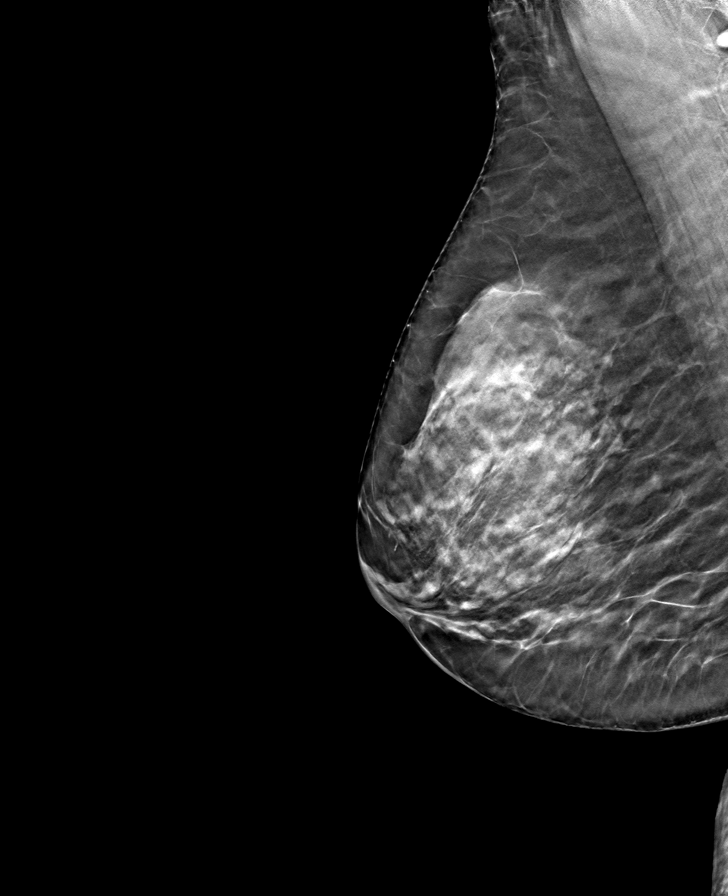

[L MLO tomo · tomo slice 33/64.0]
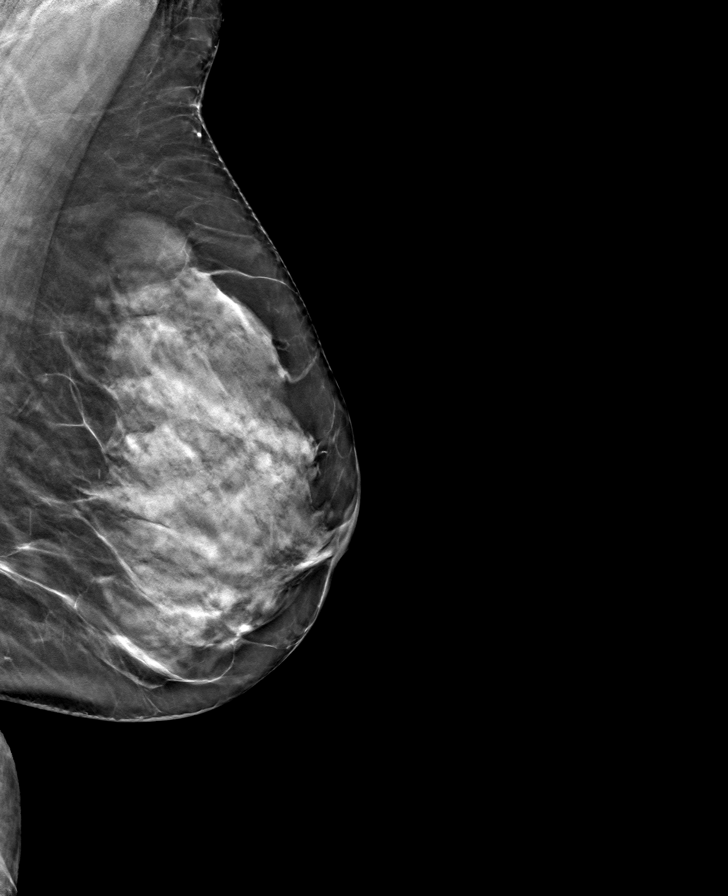

[8 of 24 positions shown; findings below may reference images not displayed]

ACR Breast Density Category c: The breast tissue is heterogeneously
dense, which may obscure small masses.
FINDINGS: There are no findings suspicious for malignancy.
IMPRESSION: No mammographic evidence of malignancy. A result letter of this
screening mammogram will be mailed directly to the patient.

RECOMMENDATION:
Screening mammogram in one year. (Code:[id number]-W-BC3)

BI-RADS CATEGORY  1: Negative.

## 2024-04-09 DIAGNOSIS — M1712 Unilateral primary osteoarthritis, left knee: Secondary | ICD-10-CM | POA: Diagnosis not present

## 2024-04-09 DIAGNOSIS — S83242D Other tear of medial meniscus, current injury, left knee, subsequent encounter: Secondary | ICD-10-CM | POA: Diagnosis not present

## 2024-04-09 DIAGNOSIS — S83242A Other tear of medial meniscus, current injury, left knee, initial encounter: Secondary | ICD-10-CM | POA: Diagnosis not present

## 2024-04-16 DIAGNOSIS — M1712 Unilateral primary osteoarthritis, left knee: Secondary | ICD-10-CM | POA: Diagnosis not present

## 2024-04-16 DIAGNOSIS — S83242A Other tear of medial meniscus, current injury, left knee, initial encounter: Secondary | ICD-10-CM | POA: Diagnosis not present

## 2024-04-20 DIAGNOSIS — S83242D Other tear of medial meniscus, current injury, left knee, subsequent encounter: Secondary | ICD-10-CM | POA: Diagnosis not present

## 2024-04-30 DIAGNOSIS — S83242D Other tear of medial meniscus, current injury, left knee, subsequent encounter: Secondary | ICD-10-CM | POA: Diagnosis not present

## 2024-05-03 NOTE — Progress Notes (Unsigned)
 Cardiology Office Note:  .   Date:  05/04/2024  ID:  Tiffany Mayo, DOB 09-Jun-1940, MRN [id number] PCP: Tiffany Charlie ORN, MD  [facility] Providers Cardiologist:  Tiffany ONEIDA Decent, MD   History of Present Illness: .    Chief Complaint  Patient presents with   Follow-up    Tiffany Mayo is a 83 y.o. female with history of CAD, HLD, TIA who presents for follow-up.   History of Present Illness   Tiffany Mayo is an 83 year old female with coronary artery disease who presents for follow-up.   She has been experiencing knee pain for the past three months due to a torn meniscus. Despite receiving gel and cortisone injections, her symptoms persist. She is attempting to schedule an appointment with Dr. Baird, who previously treated her other knee, but has encountered difficulties.  She experiences chest discomfort, particularly during exertion, such as carrying boxes up stairs. The sensation is described as a 'burn' and is accompanied by increased indigestion-like symptoms. Her energy levels have been lower, which she attributes in part to the stress of caring for her husband, who has dementia. She has used nitroglycerin  once recently.  She is the primary caregiver for her husband, who has dementia and requires constant supervision. He attends a day program twice a week, and she has additional help at home. She is concerned about the future and the possibility of moving to an independent living facility.  She is on Plavix , carvedilol  6.25 mg twice a day, Imdur  60 mg, and Ranexa  500 mg twice a day. Her cholesterol is managed with Lipitor  80 mg and Zetia  10 mg, with an LDL of 47. She also takes medication for seizure prevention following a TIA, though she is unsure about the necessity of continuing this medication as it causes drowsiness.          Problem List CAD -60% pLAD -mid LAD 80% -> PCI 07/21/2022 -LCX/OM1 90% -> PCI 07/21/2022 -95% D1 -PET MPI 07/2022 -> medium size,  moderate defect mid to apical anterior/apical lateral (diagonal ischemia) 2. HLD  -T chol 112, HDL 49, LDL 47, TG 83 3. TIA 4. 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 Pertinent positives noted in the HPI.     Studies Reviewed: [name]       LHC 04/21/2023 Left Heart Catheterization 04/21/23: Hemodynamic data: LV 190/10, EDP 27 mmHg.  Ao 187/71, mean 116 mmHg.  No pressure gradient across the aortic valve.   Angiographic data: RCA: Large-caliber vessel with mild disease in the midsegment at 30 to 40%.  RV branch is a focal 80% stenosis. LM: Large-caliber vessel, mildly calcified. LAD: Large vessel, mildly tortuous in the midsegment, proximal segment has a 60% stenosis.  Gives origin to a large D1 however it is 1 mm caliber vessel with a high-grade 99% stenosis in the midsegment, the D1 branch extends all the way to the anteroapical region and most consistent with abnormal nuclear stress test. Mid LAD 2.75 x 16 mm Synergy XD DES placed 07/21/2022 is widely patent. LCx: Large-caliber vessel, Previously placed 2.5 x 28 mm Synergy XD DES in the mid segment and distal segment is widely. Physical Exam:   VS:  BP (!) 100/56   Pulse 68   Ht 5' 3 (1.6 m)   Wt 142 lb (64.4 kg)   SpO2 97%   BMI 25.15 kg/m    Wt Readings  from Last 3 Encounters:  05/04/24 142 lb (64.4 kg)  02/03/24 141 lb (64 kg)  01/03/24 143 lb 8 oz (65.1 kg)    GEN: Well nourished, well developed in no acute distress NECK: No JVD; No carotid bruits CARDIAC: RRR, no murmurs, rubs, gallops RESPIRATORY:  Clear to auscultation without rales, wheezing or rhonchi  ABDOMEN: Soft, non-tender, non-distended EXTREMITIES:  No edema; No deformity  ASSESSMENT AND PLAN: .   Assessment and Plan    Coronary artery disease with stable angina Residual diagonal disease managed medically. Occasional exertional chest discomfort, well-controlled on current regimen.  Stable blood pressure and heart rate. Normal EKG in August. - Continue Plavix , carvedilol , Imdur , and Ranexa .  Mixed hyperlipidemia Well-controlled with excellent LDL levels as of September. - Continue Lipitor  80 mg and Zetia  10 mg.              Follow-up: Return in about 6 months (around 11/02/2024).  Signed, Tiffany DASEN. Barbaraann, MD, [facility]  [facility]  [address] [place], [place] [address] [phone]  10:09 AM

## 2024-05-04 ENCOUNTER — Encounter: Payer: Self-pay | Admitting: Cardiovascular Disease

## 2024-05-04 ENCOUNTER — Ambulatory Visit: Attending: Cardiovascular Disease | Admitting: Cardiovascular Disease

## 2024-05-04 VITALS — BP 100/56 | HR 68 | Ht 63.0 in | Wt 142.0 lb

## 2024-05-04 DIAGNOSIS — I10 Essential (primary) hypertension: Secondary | ICD-10-CM | POA: Diagnosis not present

## 2024-05-04 DIAGNOSIS — I25118 Atherosclerotic heart disease of native coronary artery with other forms of angina pectoris: Secondary | ICD-10-CM

## 2024-05-04 DIAGNOSIS — E782 Mixed hyperlipidemia: Secondary | ICD-10-CM | POA: Diagnosis not present

## 2024-05-04 NOTE — Patient Instructions (Signed)
 Medication Instructions:  Continue all medications *If you need a refill on your cardiac medications before your next appointment, please call your pharmacy*  Lab Work: None ordered  Testing/Procedures: None ordered  Follow-Up: At [facility], you and your health needs are our priority.  As part of our continuing mission to provide you with exceptional heart care, our providers are all part of one team.  This team includes your primary Cardiologist (physician) and Advanced Practice Providers or APPs (Physician Assistants and Nurse Practitioners) who all work together to provide you with the care you need, when you need it.  Your next appointment:  1 year     Call in August to schedule Dec appointment    Provider:  Dr.O'Neal           Schedule appointment with PA in 6 months   Call in April to schedule June appointment      We recommend signing up for the patient portal called MyChart.  Sign up information is provided on this After Visit Summary.  MyChart is used to connect with patients for Virtual Visits (Telemedicine).  Patients are able to view lab/test results, encounter notes, upcoming appointments, etc.  Non-urgent messages can be sent to your provider as well.   To learn more about what you can do with MyChart, go to [email or web address].

## 2024-05-07 DIAGNOSIS — S83242D Other tear of medial meniscus, current injury, left knee, subsequent encounter: Secondary | ICD-10-CM | POA: Diagnosis not present

## 2024-05-11 ENCOUNTER — Telehealth (HOSPITAL_BASED_OUTPATIENT_CLINIC_OR_DEPARTMENT_OTHER): Payer: Self-pay

## 2024-05-11 NOTE — Telephone Encounter (Signed)
 Dr. Barbaraann  You saw this patient on 05/04/2024. Per protocol we request that you comment on his cardiac risk to proceed with knee arthroscopy on TBD, and hold Plavix  since it has been less than 2 months since evaluated in the office. Please send your comment to P CV Pre-Op Pool.  Thank you, Lamarr Satterfield DNP, ANP, AACC.

## 2024-05-11 NOTE — Telephone Encounter (Signed)
° °  Pre-operative Risk Assessment    Patient Name: Tiffany Mayo  DOB: 23-Oct-1940 MRN: [id number]   Date of last office visit: 05/04/24 with Dr. Barbaraann  Date of next office visit: None  Request for Surgical Clearance    Procedure:  knee arthroscopy  Date of Surgery:  Clearance TBD                                 Surgeon:  Dr. Reyes Billing Surgeon's Group or Practice Name:  [facility] Phone number:  [phone]  Fax number:  [phone] or [phone] with Katheryn Dustman   Type of Clearance Requested:   - Medical  - Pharmacy:  Hold Clopidogrel  (Plavix ) -does not specify   Type of Anesthesia:  General    Additional requests/questions:  None  SignedPatrcia Iverson CROME   05/11/2024, 9:37 AM

## 2024-05-14 NOTE — Telephone Encounter (Signed)
° °  Patient Name: Tiffany Mayo  DOB: 1940-06-19 MRN: [id number]  Primary Cardiologist: [name], MD  Chart reviewed as part of pre-operative protocol coverage. Given past medical history and time since last visit, based on ACC/AHA guidelines, Tiffany Mayo is at acceptable risk for the planned procedure without further cardiovascular testing.   Per office protocol, if patient is without any new symptoms or concerns at the time of their virtual visit, she may hold Plavix  for 5 days prior to procedure. Please resume Plavix  as soon as possible postprocedure, at the discretion of the surgeon.    The patient was advised that if she develops new symptoms prior to surgery to contact our office to arrange for a follow-up visit, and she verbalized understanding.  I will route this recommendation to the requesting party via Epic fax function and remove from pre-op pool.  Please call with questions.  Lamarr Satterfield, NP 05/14/2024, 7:35 AM

## 2024-05-16 ENCOUNTER — Other Ambulatory Visit: Payer: Self-pay | Admitting: General Practice

## 2024-05-25 ENCOUNTER — Other Ambulatory Visit: Payer: Self-pay | Admitting: General Practice

## 2024-05-25 ENCOUNTER — Other Ambulatory Visit: Payer: Self-pay | Admitting: Cardiovascular Disease

## 2024-06-01 ENCOUNTER — Telehealth: Payer: Self-pay | Admitting: Cardiovascular Disease

## 2024-06-01 NOTE — Telephone Encounter (Signed)
 Pt would like to know if holding plavix  for surgery will effect BP. Please advise.

## 2024-06-01 NOTE — Telephone Encounter (Signed)
 Spoke with patient on the phone. Informed pt that stopping plavix  per ordered before her surgery should not effect her blood pressure. Ensured that patient was not having increased bps at the moment, she stated no just wanted to make sure.

## 2024-06-27 ENCOUNTER — Other Ambulatory Visit (HOSPITAL_COMMUNITY): Payer: Self-pay | Admitting: Specialist

## 2024-06-27 ENCOUNTER — Ambulatory Visit (HOSPITAL_COMMUNITY)
Admission: RE | Admit: 2024-06-27 | Discharge: 2024-06-27 | Disposition: A | Discharge status: Home / Self Care | Source: Ambulatory Visit | Attending: Surgery | Admitting: Surgery

## 2024-06-27 DIAGNOSIS — Z4889 Encounter for other specified surgical aftercare: Secondary | ICD-10-CM

## 2024-08-22 ENCOUNTER — Ambulatory Visit: Admitting: Neurology
